# Patient Record
Sex: Female | Born: 1960 | Race: White | Hispanic: No | Smoking: Former smoker
Health system: Southern US, Community
[De-identification: ages and names within clinical notes are randomized; demographics above are authoritative.]

## PROBLEM LIST (undated history)

## (undated) DIAGNOSIS — M069 Rheumatoid arthritis, unspecified: Secondary | ICD-10-CM

## (undated) DIAGNOSIS — I2089 Other forms of angina pectoris: Secondary | ICD-10-CM

## (undated) DIAGNOSIS — J45909 Unspecified asthma, uncomplicated: Secondary | ICD-10-CM

## (undated) DIAGNOSIS — M5431 Sciatica, right side: Secondary | ICD-10-CM

## (undated) DIAGNOSIS — Q675 Congenital deformity of spine: Secondary | ICD-10-CM

## (undated) DIAGNOSIS — K219 Gastro-esophageal reflux disease without esophagitis: Secondary | ICD-10-CM

## (undated) DIAGNOSIS — M109 Gout, unspecified: Secondary | ICD-10-CM

## (undated) DIAGNOSIS — M51369 Other intervertebral disc degeneration, lumbar region without mention of lumbar back pain or lower extremity pain: Secondary | ICD-10-CM

## (undated) DIAGNOSIS — E119 Type 2 diabetes mellitus without complications: Secondary | ICD-10-CM

## (undated) DIAGNOSIS — Z8739 Personal history of other diseases of the musculoskeletal system and connective tissue: Secondary | ICD-10-CM

## (undated) DIAGNOSIS — E785 Hyperlipidemia, unspecified: Secondary | ICD-10-CM

## (undated) DIAGNOSIS — M5416 Radiculopathy, lumbar region: Secondary | ICD-10-CM

## (undated) DIAGNOSIS — I208 Other forms of angina pectoris: Secondary | ICD-10-CM

## (undated) DIAGNOSIS — I10 Essential (primary) hypertension: Secondary | ICD-10-CM

## (undated) DIAGNOSIS — F329 Major depressive disorder, single episode, unspecified: Secondary | ICD-10-CM

## (undated) DIAGNOSIS — M5136 Other intervertebral disc degeneration, lumbar region: Secondary | ICD-10-CM

## (undated) DIAGNOSIS — F32A Depression, unspecified: Secondary | ICD-10-CM

## (undated) DIAGNOSIS — F319 Bipolar disorder, unspecified: Secondary | ICD-10-CM

## (undated) DIAGNOSIS — Z87442 Personal history of urinary calculi: Secondary | ICD-10-CM

## (undated) DIAGNOSIS — J449 Chronic obstructive pulmonary disease, unspecified: Secondary | ICD-10-CM

## (undated) DIAGNOSIS — Z8639 Personal history of other endocrine, nutritional and metabolic disease: Secondary | ICD-10-CM

## (undated) DIAGNOSIS — F410 Panic disorder [episodic paroxysmal anxiety] without agoraphobia: Secondary | ICD-10-CM

## (undated) DIAGNOSIS — F419 Anxiety disorder, unspecified: Secondary | ICD-10-CM

## (undated) DIAGNOSIS — M1990 Unspecified osteoarthritis, unspecified site: Secondary | ICD-10-CM

## (undated) DIAGNOSIS — G43909 Migraine, unspecified, not intractable, without status migrainosus: Secondary | ICD-10-CM

## (undated) HISTORY — DX: Migraine, unspecified, not intractable, without status migrainosus: G43.909

## (undated) HISTORY — PX: BACK SURGERY: SHX140

## (undated) HISTORY — DX: Essential (primary) hypertension: I10

## (undated) HISTORY — DX: Congenital deformity of spine: Q67.5

## (undated) HISTORY — DX: Personal history of urinary calculi: Z87.442

## (undated) HISTORY — PX: POLYPECTOMY: SHX149

## (undated) HISTORY — PX: FOOT SURGERY: SHX648

## (undated) HISTORY — DX: Personal history of other endocrine, nutritional and metabolic disease: Z86.39

## (undated) HISTORY — DX: Personal history of other diseases of the musculoskeletal system and connective tissue: Z87.39

## (undated) HISTORY — DX: Chronic obstructive pulmonary disease, unspecified: J44.9

## (undated) HISTORY — DX: Sciatica, right side: M54.31

## (undated) HISTORY — DX: Radiculopathy, lumbar region: M54.16

## (undated) HISTORY — DX: Type 2 diabetes mellitus without complications: E11.9

## (undated) HISTORY — DX: Unspecified asthma, uncomplicated: J45.909

## (undated) HISTORY — DX: Panic disorder (episodic paroxysmal anxiety): F41.0

## (undated) HISTORY — DX: Rheumatoid arthritis, unspecified: M06.9

## (undated) HISTORY — DX: Depression, unspecified: F32.A

## (undated) HISTORY — DX: Other forms of angina pectoris: I20.8

## (undated) HISTORY — DX: Other forms of angina pectoris: I20.89

## (undated) HISTORY — DX: Hyperlipidemia, unspecified: E78.5

## (undated) HISTORY — DX: Other intervertebral disc degeneration, lumbar region without mention of lumbar back pain or lower extremity pain: M51.369

## (undated) HISTORY — PX: CARPAL TUNNEL RELEASE: SHX101

## (undated) HISTORY — DX: Major depressive disorder, single episode, unspecified: F32.9

## (undated) HISTORY — DX: Gout, unspecified: M10.9

## (undated) HISTORY — DX: Unspecified osteoarthritis, unspecified site: M19.90

## (undated) HISTORY — DX: Bipolar disorder, unspecified: F31.9

## (undated) HISTORY — DX: Other intervertebral disc degeneration, lumbar region: M51.36

## (undated) HISTORY — DX: Gastro-esophageal reflux disease without esophagitis: K21.9

---

## 2001-01-16 HISTORY — PX: ABDOMINAL HYSTERECTOMY: SHX81

## 2006-06-07 ENCOUNTER — Ambulatory Visit: Payer: Self-pay | Admitting: Internal Medicine

## 2006-06-08 ENCOUNTER — Ambulatory Visit: Payer: Self-pay | Admitting: Internal Medicine

## 2007-08-07 ENCOUNTER — Ambulatory Visit: Payer: Self-pay | Admitting: Family Medicine

## 2007-09-24 ENCOUNTER — Ambulatory Visit: Payer: Self-pay | Admitting: Gastroenterology

## 2007-12-31 ENCOUNTER — Ambulatory Visit: Payer: Self-pay | Admitting: Family Medicine

## 2009-09-28 ENCOUNTER — Ambulatory Visit: Payer: Self-pay | Admitting: Family Medicine

## 2010-03-02 ENCOUNTER — Ambulatory Visit: Payer: Self-pay | Admitting: Family Medicine

## 2010-07-18 ENCOUNTER — Ambulatory Visit: Payer: Self-pay

## 2010-12-05 ENCOUNTER — Inpatient Hospital Stay: Payer: Self-pay | Admitting: Psychiatry

## 2011-06-16 ENCOUNTER — Ambulatory Visit: Payer: Self-pay | Admitting: Specialist

## 2011-09-27 ENCOUNTER — Ambulatory Visit: Payer: Self-pay | Admitting: Family Medicine

## 2011-10-17 ENCOUNTER — Ambulatory Visit: Payer: Self-pay | Admitting: Family Medicine

## 2011-11-17 ENCOUNTER — Ambulatory Visit: Payer: Self-pay | Admitting: Family Medicine

## 2012-03-14 ENCOUNTER — Ambulatory Visit: Payer: Self-pay

## 2013-04-11 ENCOUNTER — Ambulatory Visit: Payer: Self-pay | Admitting: Adult Health

## 2014-03-25 ENCOUNTER — Institutional Professional Consult (permissible substitution): Payer: Self-pay | Admitting: Internal Medicine

## 2014-04-22 ENCOUNTER — Encounter: Payer: Self-pay | Admitting: Internal Medicine

## 2014-04-30 ENCOUNTER — Institutional Professional Consult (permissible substitution): Payer: Self-pay | Admitting: Internal Medicine

## 2014-04-30 ENCOUNTER — Encounter: Payer: Self-pay | Admitting: *Deleted

## 2014-07-06 ENCOUNTER — Other Ambulatory Visit: Payer: Self-pay | Admitting: Family Medicine

## 2014-07-06 MED ORDER — LEVOTHYROXINE SODIUM 50 MCG PO TABS: 50.0000 ug | ORAL_TABLET | Freq: Every day | ORAL | Status: DC

## 2014-07-06 NOTE — Telephone Encounter (Signed)
I returned the call; she has not been here since one visit to establish care in February, she was supposed to return one month later, but never came back; I have no labs on her 50 mcg okay for one week (#7 verbal authorized) and then we'll ramp her back up to 75 mcg once I see her (cardiac hx, so I don't want to hit her all at once with 75 if she's been off) He agreed STAFF please call patient at [phone] or [phone] and let her know we need to see her here in the office for an appointment if we're going to be prescribing her medicines and taking care of her Have her come fasting for labs

## 2014-07-06 NOTE — Telephone Encounter (Signed)
Pharmacy called says pt has been out of thyroid medication about 2 months. Jody(pharmacist) says he sent in refill a month ago and would like a call back at [phone]

## 2014-07-13 ENCOUNTER — Other Ambulatory Visit: Payer: Self-pay | Admitting: Family Medicine

## 2014-07-13 NOTE — Telephone Encounter (Signed)
Patient notified, appointment scheduled

## 2014-07-13 NOTE — Telephone Encounter (Signed)
Pt called would like Dr. Sanda Klein to call her about the dosage of her thyroid medication believes it is supposed to be 75mg  not 50mg . Please call pt @ [phone]. Pharm is [facility]. Thanks.

## 2014-07-13 NOTE — Telephone Encounter (Signed)
She did schedule an appt with you, she needs a rx for the 78mcg of Synthroid now.

## 2014-07-14 MED ORDER — LEVOTHYROXINE SODIUM 75 MCG PO TABS: 75.0000 ug | ORAL_TABLET | Freq: Every day | ORAL | Status: DC

## 2014-07-14 NOTE — Telephone Encounter (Signed)
Patient did take the 74mcg, I explained the reasoning for it yesterday. I told her it was very important that she keep her appointment.

## 2014-07-14 NOTE — Telephone Encounter (Signed)
Please let patient know that I spoke with pharmacist about this earlier when I sent the 50 mcg; she had not been taking it regularly, and with her heart history, I was not going to jumpstart her from zero micrograms to 75 mcg all at once She was supposed to take the 50 mcg just short-term and now go back on 75 mcg It's very important that she keep her appt with me very soon; I saw her in February and she was supposed to f/u with me in March so she's well overdue for her visit and labs We need to see her regularly and monitor her labs if we're going to be able to prescribe medicine for her

## 2014-07-17 ENCOUNTER — Telehealth: Payer: Self-pay | Admitting: Family Medicine

## 2014-07-20 NOTE — Telephone Encounter (Signed)
noted 

## 2014-07-28 DIAGNOSIS — I10 Essential (primary) hypertension: Secondary | ICD-10-CM | POA: Insufficient documentation

## 2014-07-28 DIAGNOSIS — J449 Chronic obstructive pulmonary disease, unspecified: Secondary | ICD-10-CM | POA: Insufficient documentation

## 2014-07-28 DIAGNOSIS — E1140 Type 2 diabetes mellitus with diabetic neuropathy, unspecified: Secondary | ICD-10-CM | POA: Insufficient documentation

## 2014-07-30 ENCOUNTER — Ambulatory Visit: Payer: Self-pay | Admitting: Family Medicine

## 2014-08-07 ENCOUNTER — Encounter: Payer: Self-pay | Admitting: Family Medicine

## 2014-08-07 ENCOUNTER — Other Ambulatory Visit: Payer: Self-pay | Admitting: Psychiatry

## 2014-08-07 ENCOUNTER — Ambulatory Visit (INDEPENDENT_AMBULATORY_CARE_PROVIDER_SITE_OTHER): Payer: Medicare Other | Admitting: Family Medicine

## 2014-08-07 VITALS — BP 121/79 | HR 113 | Temp 99.4°F | Ht 60.5 in | Wt 154.0 lb

## 2014-08-07 DIAGNOSIS — Q675 Congenital deformity of spine: Secondary | ICD-10-CM | POA: Diagnosis not present

## 2014-08-07 DIAGNOSIS — E039 Hypothyroidism, unspecified: Secondary | ICD-10-CM | POA: Insufficient documentation

## 2014-08-07 DIAGNOSIS — M1990 Unspecified osteoarthritis, unspecified site: Secondary | ICD-10-CM

## 2014-08-07 DIAGNOSIS — E1140 Type 2 diabetes mellitus with diabetic neuropathy, unspecified: Secondary | ICD-10-CM | POA: Diagnosis not present

## 2014-08-07 DIAGNOSIS — J449 Chronic obstructive pulmonary disease, unspecified: Secondary | ICD-10-CM | POA: Diagnosis not present

## 2014-08-07 DIAGNOSIS — M109 Gout, unspecified: Secondary | ICD-10-CM | POA: Diagnosis not present

## 2014-08-07 DIAGNOSIS — Z5181 Encounter for therapeutic drug level monitoring: Secondary | ICD-10-CM | POA: Insufficient documentation

## 2014-08-07 DIAGNOSIS — R350 Frequency of micturition: Secondary | ICD-10-CM | POA: Insufficient documentation

## 2014-08-07 DIAGNOSIS — E785 Hyperlipidemia, unspecified: Secondary | ICD-10-CM | POA: Insufficient documentation

## 2014-08-07 NOTE — Assessment & Plan Note (Signed)
Not sure if rheumatoid; get outside records relating to this issue

## 2014-08-07 NOTE — Progress Notes (Signed)
BP 121/79 mmHg  Pulse 113  Temp(Src) 99.4 F (37.4 C)  Ht 5' 0.5" (1.537 m)  Wt 154 lb (69.854 kg)  BMI 29.57 kg/m2  SpO2 97%   Subjective:    Patient ID: Deborah Blackwell, female    DOB: [birth year], 54 y.o.   MRN: [id number]  HPI: Deborah Blackwell is a 54 y.o. female  Chief Complaint  Patient presents with  . medication refills    now has medicare   Patient is here for follow-up; long overdue for a visit Her diabetes has been doing real well except her feet and legs; decreased sensation; feet stay cold all the times; gets shooting pains in her feet and hands; not sure if arthritis I redirected her back to diabetes; she checks her sugars every other day; she is getting 120 she says She does get dry mouth, always drinking something Cannot tolerate metformin, made her sick to her stomach Last eye exam was... "I need one"; she doesn't have vision insurance, but thinks she will go   High cholesterol; on statin; two eggs a week; no side effects  Hypertension; checks BP away from doctor she says; she says it usually goes up at the doctor; 127 or 130 on top; 95 or 100 on the bottom  Abnormal thyroid test discovered several years ago; she did not have surgery or radiation, it just turned up abnormal on the blood test  She sees a new psychiatrist; she is going to be late on that medication, Dr. Jimmye Norman; she has been getting pain medicine and anxiety medicine from Dr. Mamie Nick but says she is going to run out before she sees the new doctor and asked if I can do that medicine  She says she has rheumatoid arthritis, but never saw the rheumatologist; she says they never diagnosed her with it, she was going to see if that's what she had; she asked Dr. Rutherford Nail what he thought and he told her that she needed to see a rheumatologist  She does not have her pulmonary doctor right now; smoked for a year; COPD after just smoking for a year  Past Medical History  Diagnosis Date  . History of scoliosis    was casted as a child  . Angina at rest   . Arthritis   . Asthma   . GERD (gastroesophageal reflux disease)   . COPD (chronic obstructive pulmonary disease)   . Diabetes mellitus without complication   . Hypertension   . Hyperlipidemia   . Gout   . RA (rheumatoid arthritis)   . History of kidney stones   . Migraines   . Depression   . Panic attack   . Bipolar disorder   . Congenital scoliosis   . DDD (degenerative disc disease), lumbar    Relevant past medical, surgical, family and social history reviewed and updated as indicated. Interim medical history since our last visit reviewed. Allergies and medications reviewed and updated.  Review of Systems  Per HPI unless specifically indicated above     Objective:    BP 121/79 mmHg  Pulse 113  Temp(Src) 99.4 F (37.4 C)  Ht 5' 0.5" (1.537 m)  Wt 154 lb (69.854 kg)  BMI 29.57 kg/m2  SpO2 97%  Wt Readings from Last 3 Encounters:  08/07/14 154 lb (69.854 kg)  03/11/14 153 lb (69.4 kg)    Physical Exam  Constitutional: She appears well-developed and well-nourished.  HENT:  Head: Normocephalic and atraumatic.  Cardiovascular: Regular rhythm.  Tachycardia  present.   Pulses:      Dorsalis pedis pulses are 1+ on the right side, and 1+ on the left side.  Musculoskeletal: She exhibits no edema.       Lumbar back: She exhibits no swelling and no edema.  I did not appreciate any significant deformity  Neurological: She is alert.  Antalgic, hobbling gait putting one foot out to the side when she walks; no significant callus formation or foot deformity to reflect chronic unsteady gait  Skin: Skin is warm and dry.  Psychiatric: She has a normal mood and affect. Her behavior is normal. Judgment and thought content normal.   Diabetic Foot Form - Detailed   Diabetic Foot Exam - detailed  Diabetic Foot exam was performed with the following findings:  Yes 08/07/2014  2:11 PM  Visual Foot Exam completed.:  Yes  Is there a history of  foot ulcer?:  No  Can the patient see the bottom of their feet?:  Yes  Are the shoes appropriate in style and fit?:  Yes  Are the toenails long?:  No  Are the toenails thick?:  No  Are the toenails ingrown?:  No    Pulse Foot Exam completed.:  Yes  Right Dorsalis Pedis:  Diminished Left Dorsalis Pedis:  Diminished  Sensory Foot Exam Completed.:  Yes  Swelling:  No  Semmes-Weinstein Monofilament Test  R Site 1-Great Toe:  Neg L Site 1-Great Toe:  Neg  R Site 4:  Neg L Site 4:  Neg         No results found for this or any previous visit.    Assessment & Plan:   Problem List Items Addressed This Visit      Respiratory   COPD (chronic obstructive pulmonary disease)    Managed by pulmonologist, using inhaler and nebulizer      Relevant Medications   budesonide-formoterol (SYMBICORT) 160-4.5 MCG/ACT inhaler     Endocrine   Diabetes mellitus with diabetic neuropathy - Primary    Check A1C today, it was 5.8 showing excellent control Check urine microalbumin; negative Continue the onglyza Refer for eye exam      Relevant Orders   Bayer DCA Hb A1c Waived   Microalbumin, Urine Waived   Hypothyroidism   Relevant Orders   TSH     Musculoskeletal and Integument   Arthritis    Not sure if rheumatoid; get outside records relating to this issue      Congenital scoliosis    Managed by another provider; hx of epidurals; she pressed about the pain medicine and whether or not I would prescribe this; I reiterated to her that I am not going to prescribe oxycodone and that she needs to contact the prescriber currently giving her this medicine to work out getting another Rx or tapering or referral to pain clinic; I am happy to talk to the prescriber personally, but otherwise will not plan to get involved        Other   Gout    Check uric acid today Avoid foods rich in purines (organ meats, chicken soup, [place], gravies)      Hyperlipidemia    Check lipids; on statin; limit  saturated fats; LDL today is 133 on 40 mg of Lipitor; wait to get the liver function tests back and if okay, we'll increase to 80 mg      Relevant Orders   Lipid Panel Piccolo, Waived   Medication monitoring encounter   Relevant Orders   UA/M  w/rflx Culture, Routine   CBC with Differential/Platelet   Comprehensive metabolic panel    Other Visit Diagnoses    Urinary frequency        check urinalysis today; 1+ LE but 0-5 WBCs/hpf; culture pending        Follow up plan: Return in about 1 month (around 09/07/2014) for multiple health issues.

## 2014-08-07 NOTE — Assessment & Plan Note (Signed)
Check uric acid today Avoid foods rich in purines (organ meats, chicken soup, [place], gravies)

## 2014-08-07 NOTE — Assessment & Plan Note (Addendum)
Managed by another provider; hx of epidurals; she pressed about the pain medicine and whether or not I would prescribe this; I reiterated to her that I am not going to prescribe oxycodone and that she needs to contact the prescriber currently giving her this medicine to work out getting another Rx or tapering or referral to pain clinic; I am happy to talk to the prescriber personally, but otherwise will not plan to get involved

## 2014-08-07 NOTE — Assessment & Plan Note (Addendum)
Check A1C today, it was 5.8 showing excellent control Check urine microalbumin; negative Continue the onglyza Refer for eye exam

## 2014-08-07 NOTE — Assessment & Plan Note (Signed)
Managed by pulmonologist, using inhaler and nebulizer

## 2014-08-07 NOTE — Patient Instructions (Addendum)
Check your sugars once a day on average, but it's okay to check less frequently if you desire Try to limit sweets in your diet (sugary drinks like soft drinks, sweet iced tea, cookies, candies, etc.) as well as simple carbohydrated (white bread, white potatoes, white rice) You will need to call "Dr. Mamie Nick" for whatever medicine it is that she prescribes for you I will not be managing her medicines (the oxycodone or alprazolam) If you stop alprazolam abruptly, that can cause seizures so you need to work with Dr. Mamie Nick about a plan to continue that or wean that safely You may suffer withdraw from the narcotics if you stop that abruptly, so talk with Dr. Mamie Nick about that or see a pain specialist Please ask Dr. Mamie Nick to arrange for any referrals or call me personally We'll schedule the eye doctor appointment for you Check the bottom of your every single night Limit saturated fats in your diet (cheese, fried foods, hamburgers, sausage, bacon) If your cuff at home is reading 95 or 100 on the bottom when you check your pressures, we'll want you to bring your cuff in and measure your pressure here against our cuff Limit salt in the diet Return in one month for further issues I don't recommend that you use a triptan   DASH Eating Plan DASH stands for "Dietary Approaches to Stop Hypertension." The DASH eating plan is a healthy eating plan that has been shown to reduce high blood pressure (hypertension). Additional health benefits may include reducing the risk of type 2 diabetes mellitus, heart disease, and stroke. The DASH eating plan may also help with weight loss. WHAT DO I NEED TO KNOW ABOUT THE DASH EATING PLAN? For the DASH eating plan, you will follow these general guidelines:  Choose foods with a percent daily value for sodium of less than 5% (as listed on the food label).  Use salt-free seasonings or herbs instead of table salt or sea salt.  Check with your health care provider or pharmacist before using salt  substitutes.  Eat lower-sodium products, often labeled as "lower sodium" or "no salt added."  Eat fresh foods.  Eat more vegetables, fruits, and low-fat dairy products.  Choose whole grains. Look for the word "whole" as the first word in the ingredient list.  Choose fish and skinless chicken or [place] more often than red meat. Limit fish, poultry, and meat to 6 oz (170 g) each day.  Limit sweets, desserts, sugars, and sugary drinks.  Choose heart-healthy fats.  Limit cheese to 1 oz (28 g) per day.  Eat more home-cooked food and less restaurant, buffet, and fast food.  Limit fried foods.  Cook foods using methods other than frying.  Limit canned vegetables. If you do use them, rinse them well to decrease the sodium.  When eating at a restaurant, ask that your food be prepared with less salt, or no salt if possible. WHAT FOODS CAN I EAT? Seek help from a dietitian for individual calorie needs. Grains Whole grain or whole wheat bread. Brown rice. Whole grain or whole wheat pasta. Quinoa, bulgur, and whole grain cereals. Low-sodium cereals. Corn or whole wheat flour tortillas. Whole grain cornbread. Whole grain crackers. Low-sodium crackers. Vegetables Fresh or frozen vegetables (raw, steamed, roasted, or grilled). Low-sodium or reduced-sodium tomato and vegetable juices. Low-sodium or reduced-sodium tomato sauce and paste. Low-sodium or reduced-sodium canned vegetables.  Fruits All fresh, canned (in natural juice), or frozen fruits. Meat and Other Protein Products Ground beef (85% or leaner),  grass-fed beef, or beef trimmed of fat. Skinless chicken or [place]. Ground chicken or [place]. Pork trimmed of fat. All fish and seafood. Eggs. Dried beans, peas, or lentils. Unsalted nuts and seeds. Unsalted canned beans. Dairy Low-fat dairy products, such as skim or 1% milk, 2% or reduced-fat cheeses, low-fat ricotta or cottage cheese, or plain low-fat yogurt. Low-sodium or reduced-sodium  cheeses. Fats and Oils Tub margarines without trans fats. Light or reduced-fat mayonnaise and salad dressings (reduced sodium). Avocado. Safflower, olive, or canola oils. Natural peanut or almond butter. Other Unsalted popcorn and pretzels. The items listed above may not be a complete list of recommended foods or beverages. Contact your dietitian for more options. WHAT FOODS ARE NOT RECOMMENDED? Grains White bread. White pasta. White rice. Refined cornbread. Bagels and croissants. Crackers that contain trans fat. Vegetables Creamed or fried vegetables. Vegetables in a cheese sauce. Regular canned vegetables. Regular canned tomato sauce and paste. Regular tomato and vegetable juices. Fruits Dried fruits. Canned fruit in light or heavy syrup. Fruit juice. Meat and Other Protein Products Fatty cuts of meat. Ribs, chicken wings, bacon, sausage, bologna, salami, chitterlings, fatback, hot dogs, bratwurst, and packaged luncheon meats. Salted nuts and seeds. Canned beans with salt. Dairy Whole or 2% milk, cream, half-and-half, and cream cheese. Whole-fat or sweetened yogurt. Full-fat cheeses or blue cheese. Nondairy creamers and whipped toppings. Processed cheese, cheese spreads, or cheese curds. Condiments Onion and garlic salt, seasoned salt, table salt, and sea salt. Canned and packaged gravies. Worcestershire sauce. Tartar sauce. Barbecue sauce. Teriyaki sauce. Soy sauce, including reduced sodium. Steak sauce. Fish sauce. Oyster sauce. Cocktail sauce. Horseradish. Ketchup and mustard. Meat flavorings and tenderizers. Bouillon cubes. Hot sauce. Tabasco sauce. Marinades. Taco seasonings. Relishes. Fats and Oils Butter, stick margarine, lard, shortening, ghee, and bacon fat. Coconut, palm kernel, or palm oils. Regular salad dressings. Other Pickles and olives. Salted popcorn and pretzels. The items listed above may not be a complete list of foods and beverages to avoid. Contact your dietitian for  more information. WHERE CAN I FIND MORE INFORMATION? National Heart, Lung, and Blood Institute: travelstabloid.com Document Released: 12/22/2010 Document Revised: 05/19/2013 Document Reviewed: 11/06/2012 [facility] Patient Information 2015 [place], [place]. This information is not intended to replace advice given to you by your health care provider. Make sure you discuss any questions you have with your health care provider. Diabetes and Foot Care Diabetes may cause you to have problems because of poor blood supply (circulation) to your feet and legs. This may cause the skin on your feet to become thinner, break easier, and heal more slowly. Your skin may become dry, and the skin may peel and crack. You may also have nerve damage in your legs and feet causing decreased feeling in them. You may not notice minor injuries to your feet that could lead to infections or more serious problems. Taking care of your feet is one of the most important things you can do for yourself.  HOME CARE INSTRUCTIONS  Wear shoes at all times, even in the house. Do not go barefoot. Bare feet are easily injured.  Check your feet daily for blisters, cuts, and redness. If you cannot see the bottom of your feet, use a mirror or ask someone for help.  Wash your feet with warm water (do not use hot water) and mild soap. Then pat your feet and the areas between your toes until they are completely dry. Do not soak your feet as this can dry your skin.  Apply a  moisturizing lotion or petroleum jelly (that does not contain alcohol and is unscented) to the skin on your feet and to dry, brittle toenails. Do not apply lotion between your toes.  Trim your toenails straight across. Do not dig under them or around the cuticle. File the edges of your nails with an emery board or nail file.  Do not cut corns or calluses or try to remove them with medicine.  Wear clean socks or stockings every day. Make sure  they are not too tight. Do not wear knee-high stockings since they may decrease blood flow to your legs.  Wear shoes that fit properly and have enough cushioning. To break in new shoes, wear them for just a few hours a day. This prevents you from injuring your feet. Always look in your shoes before you put them on to be sure there are no objects inside.  Do not cross your legs. This may decrease the blood flow to your feet.  If you find a minor scrape, cut, or break in the skin on your feet, keep it and the skin around it clean and dry. These areas may be cleansed with mild soap and water. Do not cleanse the area with peroxide, alcohol, or iodine.  When you remove an adhesive bandage, be sure not to damage the skin around it.  If you have a wound, look at it several times a day to make sure it is healing.  Do not use heating pads or hot water bottles. They may burn your skin. If you have lost feeling in your feet or legs, you may not know it is happening until it is too late.  Make sure your health care provider performs a complete foot exam at least annually or more often if you have foot problems. Report any cuts, sores, or bruises to your health care provider immediately. SEEK MEDICAL CARE IF:   You have an injury that is not healing.  You have cuts or breaks in the skin.  You have an ingrown nail.  You notice redness on your legs or feet.  You feel burning or tingling in your legs or feet.  You have pain or cramps in your legs and feet.  Your legs or feet are numb.  Your feet always feel cold. SEEK IMMEDIATE MEDICAL CARE IF:   There is increasing redness, swelling, or pain in or around a wound.  There is a red line that goes up your leg.  Pus is coming from a wound.  You develop a fever or as directed by your health care provider.  You notice a bad smell coming from an ulcer or wound. Document Released: 12/31/1999 Document Revised: 09/04/2012 Document Reviewed:  06/11/2012 [facility] Patient Information 2015 [place], [place]. This information is not intended to replace advice given to you by your health care provider. Make sure you discuss any questions you have with your health care provider. Dyslipidemia Dyslipidemia is an imbalance of the lipids in your blood. Lipids are waxy, fat-like proteins that your body needs in small amounts. Dyslipidemia often involves the lipids cholesterol or triglycerides. Common forms of dyslipidemia are:  High levels of bad cholesterol (LDL cholesterol). LDL cholesterol is the type of cholesterol that causes heart disease.  Low levels of good cholesterol (HDL cholesterol). HDL cholesterol is the type of cholesterol that helps protect against heart disease.  High levels of triglycerides. Triglycerides are a fatty substance in the blood linked to a buildup of plaque on your arteries. RISK FACTORS  Increased age.  Having a family history of high cholesterol.  Certain medicines, including birth control pills, diuretics, beta-blockers, and some medicines for depression.  Smoking.  Eating a high-fat diet.  Being overweight.  Medical conditions such as diabetes, polycystic ovary syndrome, pregnancy, kidney disease, and hypothyroidism.  Lack of regular exercise. SIGNS AND SYMPTOMS There are no signs or symptoms with dyslipidemia.  DIAGNOSIS  A simple blood test called a fasting blood test can be done to determine your level of:  Total cholesterol. This is the combined number of LDL cholesterol and HDL cholesterol. A healthy number is lower than 200.  LDL cholesterol. The goal number for LDL cholesterol is different for each person depending on risk factors. Ask your health care provider what your LDL cholesterol number should be.  HDL cholesterol. A healthy level of HDL cholesterol is 60 or higher. A number lower than 40 for men or 50 for women is a danger sign.  Triglycerides. A healthy triglyceride number is less  than 150. TREATMENT  Dyslipidemia is a treatable condition. Your health care provider will advise you on what type of treatment is best based on your age, your test results, and current guidelines. Treatment may include:   Dietary changes. A dietitian can help you create a meal plan. You may need to:  Eat more foods that contain omega-3s, such as salmon and other fish.  Replace saturated fats and trans fats in your diet with healthy fats such as nuts, seeds, avocados, olive oil, and canola oil.  Regular exercise. This can help lower your LDL cholesterol, raise your HDL cholesterol, and help with weight management. Check with your health care provider before beginning an exercise program. Most people should participate in 30 minutes of brisk exercise 5 days a week.  Quitting smoking.  Medicines to lower LDL cholesterol and triglycerides. Your health care provider will monitor your lipid levels with regular blood tests. HOME CARE INSTRUCTIONS  Eat a healthy diet. Follow any diet instructions if they were given to you by your health care provider.  Maintain a healthy weight.  Exercise regularly based on the recommendations of your health care provider.  Do not use any tobacco products, including cigarettes, chewing tobacco, or electronic cigarettes.  Take medicines only as directed by your health care provider.  Keep all follow-up visits as directed by your health care provider. SEEK MEDICAL CARE IF: You are having possible side effects from your medicines. Document Released: 01/07/2013 Document Revised: 05/19/2013 Document Reviewed: 01/07/2013 [facility] Patient Information 2015 [place], [place]. This information is not intended to replace advice given to you by your health care provider. Make sure you discuss any questions you have with your health care provider.

## 2014-08-07 NOTE — Assessment & Plan Note (Addendum)
Check lipids; on statin; limit saturated fats; LDL today is 133 on 40 mg of Lipitor; wait to get the liver function tests back and if okay, we'll increase to 80 mg

## 2014-08-08 LAB — LIPID PANEL PICCOLO, WAIVED
CHOL/HDL RATIO PICCOLO,WAIVE: 4.5 mg/dL
Cholesterol Piccolo, Waived: 196 mg/dL (ref <200)
HDL Chol Piccolo, Waived: 43 mg/dL — ABNORMAL LOW (ref >59)
LDL Chol Calc Piccolo Waived: 133 mg/dL — ABNORMAL HIGH (ref <100)
Triglycerides Piccolo,Waived: 100 mg/dL (ref <150)
VLDL CHOL CALC PICCOLO,WAIVE: 20 mg/dL (ref <30)

## 2014-08-08 LAB — MICROSCOPIC EXAMINATION

## 2014-08-08 LAB — COMPREHENSIVE METABOLIC PANEL
ALBUMIN: 4.4 g/dL (ref 3.5–5.5)
ALT: 17 IU/L (ref 0–32)
AST: 19 IU/L (ref 0–40)
Albumin/Globulin Ratio: 1.4 (ref 1.1–2.5)
Alkaline Phosphatase: 84 IU/L (ref 39–117)
BILIRUBIN TOTAL: 0.5 mg/dL (ref 0.0–1.2)
BUN / CREAT RATIO: 7 — AB (ref 9–23)
BUN: 6 mg/dL (ref 6–24)
CHLORIDE: 102 mmol/L (ref 97–108)
CO2: 23 mmol/L (ref 18–29)
CREATININE: 0.84 mg/dL (ref 0.57–1.00)
Calcium: 9.8 mg/dL (ref 8.7–10.2)
GFR, EST AFRICAN AMERICAN: 90 mL/min/{1.73_m2} (ref >59)
GFR, EST NON AFRICAN AMERICAN: 78 mL/min/{1.73_m2} (ref >59)
Globulin, Total: 3.1 g/dL (ref 1.5–4.5)
Glucose: 149 mg/dL — ABNORMAL HIGH (ref 65–99)
Potassium: 4.8 mmol/L (ref 3.5–5.2)
SODIUM: 143 mmol/L (ref 134–144)
Total Protein: 7.5 g/dL (ref 6.0–8.5)

## 2014-08-08 LAB — CBC WITH DIFFERENTIAL/PLATELET
Basophils Absolute: 0 10*3/uL (ref 0.0–0.2)
Basos: 0 %
EOS (ABSOLUTE): 0 10*3/uL (ref 0.0–0.4)
Eos: 1 %
Hematocrit: 43.2 % (ref 34.0–46.6)
Hemoglobin: 14.5 g/dL (ref 11.1–15.9)
IMMATURE GRANULOCYTES: 0 %
Immature Grans (Abs): 0 10*3/uL (ref 0.0–0.1)
Lymphocytes Absolute: 1.7 10*3/uL (ref 0.7–3.1)
Lymphs: 21 %
MCH: 29 pg (ref 26.6–33.0)
MCHC: 33.6 g/dL (ref 31.5–35.7)
MCV: 86 fL (ref 79–97)
MONOS ABS: 0.5 10*3/uL (ref 0.1–0.9)
Monocytes: 6 %
NEUTROS PCT: 72 %
Neutrophils Absolute: 5.7 10*3/uL (ref 1.4–7.0)
PLATELETS: 315 10*3/uL (ref 150–379)
RBC: 5 x10E6/uL (ref 3.77–5.28)
RDW: 13.5 % (ref 12.3–15.4)
WBC: 7.9 10*3/uL (ref 3.4–10.8)

## 2014-08-08 LAB — MICROALBUMIN, URINE WAIVED
Creatinine, Urine Waived: 50 mg/dL (ref 10–300)
Microalb, Ur Waived: 10 mg/L (ref 0–19)
Microalb/Creat Ratio: <30 mg/g (ref <30)

## 2014-08-08 LAB — BAYER DCA HB A1C WAIVED: HB A1C (BAYER DCA - WAIVED): 5.8 % (ref <7.0)

## 2014-08-08 LAB — TSH: TSH: 1.7 u[IU]/mL (ref 0.450–4.500)

## 2014-08-09 ENCOUNTER — Encounter: Payer: Self-pay | Admitting: Family Medicine

## 2014-08-09 LAB — UA/M W/RFLX CULTURE, ROUTINE: Organism ID, Bacteria: NO GROWTH

## 2014-08-11 ENCOUNTER — Telehealth: Payer: Self-pay

## 2014-08-11 ENCOUNTER — Other Ambulatory Visit: Payer: Self-pay

## 2014-08-11 DIAGNOSIS — Z79891 Long term (current) use of opiate analgesic: Secondary | ICD-10-CM | POA: Insufficient documentation

## 2014-08-11 NOTE — Telephone Encounter (Signed)
Patient notified

## 2014-08-11 NOTE — Telephone Encounter (Signed)
Dr. Bary Leriche returned a call in regards to her pain meds. Approx. 3 months ago (maybe March), Deborah Blackwell felt that her Medicare was going to start at the beginning of the year and then she found out that it would not start until July. At that time, she could not afford to go to pain management, and Dr. Mamie Nick stated she had known her for a long time and she did not want to have to admit Deborah Blackwell for detox from withdrawls, so she went ahead and wrote her rxs for her pain med to cover her until she could get into pain med in July. Now Deborah Blackwell keeps calling her begging her for more refills and she is advising Deborah Blackwell that their arrangement was to cover her until July and then Deborah Blackwell had to go see pain management.  Dr. Guadlupe Spanish is also no longer seeing patients in the clinic, so Deborah Blackwell will be getting switched to another psych doctor in their clinic. She said if you had any other questions, to please feel free to page her.

## 2014-08-11 NOTE — Telephone Encounter (Signed)
I will enter a pain clinic referral; it doesn't say in the note that Dr. Bary Leriche did that (I don't know why she wouldn't have done that if she was the managing provider) If patient cannot get in to see pain clinic soon enough for her satisfaction, she is welcome to go to a methadone clinic to see about weaning off and getting on medicine while she comes off of prescription opiates [facility]  [address], [place] [address] [phone]  It sounds like Dr. Bary Leriche told her that she would only prescribe medicines until July per the note below I will make referral but I will not prescribe any pain medicines and we will not be involved further

## 2014-08-12 ENCOUNTER — Telehealth: Payer: Self-pay

## 2014-08-12 MED ORDER — LEVOTHYROXINE SODIUM 75 MCG PO TABS: 75.0000 ug | ORAL_TABLET | Freq: Every day | ORAL | Status: DC

## 2014-08-12 NOTE — Telephone Encounter (Signed)
Patient called back and left a message stating the Methadone clinic will not accept her due to her being on Alprazolam. She wants to know what else she can do.

## 2014-08-12 NOTE — Telephone Encounter (Signed)
I'm sorry but I'm not writing her any pain medicine I spoke with her, explained she should have worked this out with Dr. Bary Leriche months ago The methadone clinic is not accepting patients on her anxiety medicine she tells me I did enter a pain clinic referral, but do not know when that will be I explained that I will not write any pain medicines

## 2014-08-18 ENCOUNTER — Ambulatory Visit (INDEPENDENT_AMBULATORY_CARE_PROVIDER_SITE_OTHER): Payer: Medicare Other | Admitting: Psychiatry

## 2014-08-18 ENCOUNTER — Encounter: Payer: Self-pay | Admitting: Psychiatry

## 2014-08-18 VITALS — BP 118/78 | HR 60 | Temp 97.7°F | Ht 60.5 in | Wt 157.0 lb

## 2014-08-18 DIAGNOSIS — F332 Major depressive disorder, recurrent severe without psychotic features: Secondary | ICD-10-CM

## 2014-08-18 MED ORDER — ALPRAZOLAM 1 MG PO TABS: 1.0000 mg | ORAL_TABLET | Freq: Four times a day (QID) | ORAL | Status: DC

## 2014-08-18 MED ORDER — ARIPIPRAZOLE 10 MG PO TABS: 10.0000 mg | ORAL_TABLET | Freq: Every day | ORAL | Status: DC

## 2014-08-18 MED ORDER — FLUOXETINE HCL 40 MG PO CAPS: 40.0000 mg | ORAL_CAPSULE | Freq: Every day | ORAL | Status: DC
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
## 2014-08-25 ENCOUNTER — Encounter: Payer: Self-pay | Admitting: Family Medicine

## 2014-09-01 ENCOUNTER — Telehealth: Payer: Self-pay | Admitting: Psychiatry

## 2014-09-01 NOTE — Telephone Encounter (Signed)
spoke with patient told patient that i did fax over the infomation and i have not heard anything back.  I did call and leave a message for dr. crisp office. and I will call them tomorrow to check on the status again.

## 2014-09-01 NOTE — Telephone Encounter (Signed)
left message to check on the statues of the referral for patient.

## 2014-09-02 NOTE — Telephone Encounter (Signed)
left message with referral staff (angela). left message asking about the statues of the appt for patient.

## 2014-09-03 ENCOUNTER — Ambulatory Visit: Payer: Self-pay | Admitting: Psychiatry

## 2014-09-03 NOTE — Telephone Encounter (Signed)
called spoke with Caryl Pina, she states that they called the patient this morining and left message for patient to call them back.

## 2014-09-07 ENCOUNTER — Ambulatory Visit: Payer: Medicare Other | Admitting: Family Medicine

## 2014-09-07 NOTE — Telephone Encounter (Signed)
pt has appt for 09-30-14 @ 11:45

## 2014-09-08 NOTE — Telephone Encounter (Signed)
Referral to pain management has been made per CMA contact with Dr. Ethel Rana office. AW

## 2014-09-16 ENCOUNTER — Ambulatory Visit: Payer: Self-pay | Admitting: Psychiatry

## 2014-09-30 ENCOUNTER — Ambulatory Visit: Payer: Medicare Other | Attending: Anesthesiology | Admitting: Anesthesiology

## 2014-09-30 ENCOUNTER — Telehealth: Payer: Self-pay | Admitting: Psychiatry

## 2014-09-30 ENCOUNTER — Encounter: Payer: Self-pay | Admitting: Anesthesiology

## 2014-09-30 VITALS — BP 102/83 | HR 112 | Temp 98.3°F | Resp 16 | Ht 61.0 in | Wt 154.0 lb

## 2014-09-30 DIAGNOSIS — M545 Low back pain: Secondary | ICD-10-CM | POA: Diagnosis not present

## 2014-09-30 DIAGNOSIS — E039 Hypothyroidism, unspecified: Secondary | ICD-10-CM | POA: Insufficient documentation

## 2014-09-30 DIAGNOSIS — G8929 Other chronic pain: Secondary | ICD-10-CM | POA: Insufficient documentation

## 2014-09-30 DIAGNOSIS — E119 Type 2 diabetes mellitus without complications: Secondary | ICD-10-CM | POA: Diagnosis not present

## 2014-09-30 DIAGNOSIS — M4120 Other idiopathic scoliosis, site unspecified: Secondary | ICD-10-CM

## 2014-09-30 DIAGNOSIS — E1140 Type 2 diabetes mellitus with diabetic neuropathy, unspecified: Secondary | ICD-10-CM | POA: Insufficient documentation

## 2014-09-30 DIAGNOSIS — M5137 Other intervertebral disc degeneration, lumbosacral region: Secondary | ICD-10-CM

## 2014-09-30 DIAGNOSIS — M5416 Radiculopathy, lumbar region: Secondary | ICD-10-CM | POA: Diagnosis not present

## 2014-09-30 DIAGNOSIS — M5431 Sciatica, right side: Secondary | ICD-10-CM | POA: Insufficient documentation

## 2014-09-30 DIAGNOSIS — M4125 Other idiopathic scoliosis, thoracolumbar region: Secondary | ICD-10-CM | POA: Diagnosis not present

## 2014-09-30 DIAGNOSIS — M5010 Cervical disc disorder with radiculopathy, unspecified cervical region: Secondary | ICD-10-CM | POA: Insufficient documentation

## 2014-09-30 DIAGNOSIS — M5136 Other intervertebral disc degeneration, lumbar region: Secondary | ICD-10-CM | POA: Diagnosis not present

## 2014-09-30 DIAGNOSIS — F329 Major depressive disorder, single episode, unspecified: Secondary | ICD-10-CM

## 2014-09-30 DIAGNOSIS — F32A Depression, unspecified: Secondary | ICD-10-CM | POA: Insufficient documentation

## 2014-09-30 DIAGNOSIS — M5441 Lumbago with sciatica, right side: Secondary | ICD-10-CM | POA: Diagnosis not present

## 2014-09-30 DIAGNOSIS — F411 Generalized anxiety disorder: Secondary | ICD-10-CM | POA: Diagnosis not present

## 2014-09-30 DIAGNOSIS — M5412 Radiculopathy, cervical region: Secondary | ICD-10-CM | POA: Diagnosis not present

## 2014-09-30 DIAGNOSIS — M542 Cervicalgia: Secondary | ICD-10-CM | POA: Diagnosis not present

## 2014-09-30 DIAGNOSIS — E1040 Type 1 diabetes mellitus with diabetic neuropathy, unspecified: Secondary | ICD-10-CM

## 2014-09-30 DIAGNOSIS — M5030 Other cervical disc degeneration, unspecified cervical region: Secondary | ICD-10-CM

## 2014-09-30 HISTORY — DX: Sciatica, right side: M54.31

## 2014-09-30 HISTORY — DX: Type 2 diabetes mellitus with diabetic neuropathy, unspecified: E11.40

## 2014-09-30 HISTORY — DX: Radiculopathy, lumbar region: M54.16

## 2014-09-30 NOTE — Progress Notes (Signed)
Subjective:    Patient ID: Deborah Blackwell, female    DOB: [birth year], 54 y.o.   MRN: [id number] This patient is a pleasant delightful 54 year old lady who comes in with 2 major sources of pain. The primary source of pain is chronic low back pain radiating into the right buttock and then going down into the right leg and a right foot. In the right right foot there was tingling and paresthesias and numbness of the right foot Her secondary pain was chronic neck pain radiating into the right shoulder and the right arm Today she would like to be treated for her primary pain which is her chronic low back pain Patient indicates that she was in a body cast for approximately 1 year Her back pain has been treated with various injections which gave her partial relief. She's also been treated with TENS which gave her no relief. He had chiropractic care which again gave her temporary partial relief Over the past 25 years which is the duration of her chronic pain syndrome she has received a variety of opioids from Darvocet to Vicodin and Tylenol and the current pain medication that she is taking now is a box he code 01 30 mg every 6 hours She has now been able to decrease the dose the dosing from 30 mg every 6 hours to 30 mg every 12 hours Her subjective pain intensity rating today is 60%. When the pain is severe her subjective pain intensity rating arises to 80%.  Pain medications Current pain medication is oxycodone 30 mg every 12 hours  Other medications Other medications include amlodipine and ranitidine fentanyl and gabapentin Cozaar Onglyza for diabetes fluoxetine potassium chloride Lipitor omeprazole Synthroid atenolol Claritin Imitrex promethazine Voltaren gel Xanax and Lasix  Allergies She is allergic to aspirin and possibly intravenous contrast dye  Past medical history Past medical history is positive for diabetes mellitus hypertension and hypercholesterolemia migraine congenital scoliosis  asthma. Depression and anxiety state endometriosis and gout  Past surgical history Past surgical history is positive for total abdominal hysterectomy and one-sided salpingo-oophorectomy for endometriosis and ovarian cysts, right carpal tunnel surgery and right foot surgery for bone spurs.  Social and economic history Patient does not smoke She does not use alcohol She does not use illicit drugs  Family history Patient is married for 32 years and has no children She indicates today that she has tried have children but because of endometriosis that was not possible Her mother is deceased at age 29 from demented and colon cancer Her father is deceased at age 51 from dementia and Alzheimer's disease She has 4 brothers who are alive and well she has one sister who is alive and well  Work status Patient is on full Social Security disability   HPI    Review of Systems  Constitutional: Negative.  Negative for fever, chills, diaphoresis, activity change, appetite change, fatigue and unexpected weight change.  HENT: Negative.  Negative for congestion, dental problem, drooling, ear discharge, ear pain, facial swelling, hearing loss, mouth sores, nosebleeds, postnasal drip, rhinorrhea and sinus pressure.   Eyes: Negative for photophobia, pain, discharge, redness, itching and visual disturbance.  Respiratory: Negative.  Negative for apnea, cough, choking, chest tightness, shortness of breath, wheezing and stridor.   Cardiovascular: Negative.  Negative for chest pain, palpitations and leg swelling.  Endocrine: Negative.   Genitourinary: Negative.   Musculoskeletal: Positive for myalgias, back pain, arthralgias, gait problem, neck pain and neck stiffness. Negative for joint swelling.  Skin:  Negative.  Negative for color change, pallor, rash and wound.  Allergic/Immunologic: Negative.   Neurological: Negative.  Negative for dizziness, tremors, seizures, syncope, facial asymmetry, speech  difficulty, weakness, light-headedness, numbness and headaches.  Hematological: Negative.   Psychiatric/Behavioral: Negative.        Objective:   Physical Exam  Constitutional: She is oriented to person, place, and time. No distress.  This patient has congenital scoliosis and was in a body cast for one year  HENT:  Head: Normocephalic and atraumatic.  Right Ear: External ear normal.  Left Ear: External ear normal.  Nose: Nose normal.  Mouth/Throat: Oropharynx is clear and moist.  Eyes: Conjunctivae and EOM are normal. Pupils are equal, round, and reactive to light. Right eye exhibits no discharge. Left eye exhibits no discharge. No scleral icterus.  Neck: No JVD present. No tracheal deviation present. No thyromegaly present.  Range of motion was decreased especially in extension and flexion  Cardiovascular: Normal rate, regular rhythm, normal heart sounds and intact distal pulses.  Exam reveals no gallop and no friction rub.   No murmur heard. Blood pressure was 102/83 mmHg Pulse was 112 bpm Equal and  Regular Heart sounds 1 and 2 were heard in all areas and there were no audible murmurs Temperature was 98.50F Respirations was 16 breaths per minute SPO2 was 99%  Pulmonary/Chest: Effort normal and breath sounds normal. No stridor. No respiratory distress. She has no wheezes. She has no rales. She exhibits no tenderness.  Abdominal: Soft. Bowel sounds are normal. She exhibits no distension. There is no tenderness. There is no rebound and no guarding.  Genitourinary:  Genitourinary exam was deferred  Musculoskeletal: She exhibits tenderness. She exhibits no edema.  There was significant thoracolumbar scoliosis Range of motion of the lower extremi was somewhat impaired Straight leg raising test on the right side was 60 degrees Straight leg raising test on the left side was 80 degrees Torsion test was mildly positive on both sides  Lymphadenopathy:    She has no cervical adenopathy.   Neurological: She is alert and oriented to person, place, and time. She has normal reflexes. She displays normal reflexes. No cranial nerve deficit. She exhibits normal muscle tone. Coordination normal.  Skin: Skin is warm and dry. No rash noted. She is not diaphoretic. No erythema. No pallor.  Psychiatric: She has a normal mood and affect. Her behavior is normal. Judgment and thought content normal.  Nursing note and vitals reviewed.         Assessment & Plan:   Assessment 1 chronic low back pain 2 lumbar degenerative disc disease 3 lumbar radiculopathy 4 status post congenital scoliosis 5 Diabetes mellitus 6 hypothyroidism 7 depression 8 anxiety state 9 chronic cervical pain 10 cervical degenerative disc disease 11 cervical radiculopathy   Plan of management 1 would plan a diagnostic/therapeutic  caudal epidural steroid injection 2 if that procedure is not effective then we'll plan to do imaging possibly MRI of the lumbar sacral spine 3 at that time based on the results of the MRI we will plan further interventional  procedures for her. 4 Will wait for a letter from her primary care physician who was prescribing the opioids to indicate that she would not be doing so and that I would be only 1 given her opioids for chronic pain management  New patient   level [place].D.

## 2014-09-30 NOTE — Progress Notes (Signed)
Safety precautions to be maintained throughout the outpatient stay will include: orient to surroundings, keep bed in low position, maintain call bell within reach at all times, provide assistance with transfer out of bed and ambulation.  

## 2014-09-30 NOTE — Patient Instructions (Signed)
Epidural Steroid Injection Patient Information  Description: The epidural space surrounds the nerves as they exit the spinal cord.  In some patients, the nerves can be compressed and inflamed by a bulging disc or a tight spinal canal (spinal stenosis).  By injecting steroids into the epidural space, we can bring irritated nerves into direct contact with a potentially helpful medication.  These steroids act directly on the irritated nerves and can reduce swelling and inflammation which often leads to decreased pain.  Epidural steroids may be injected anywhere along the spine and from the neck to the low back depending upon the location of your pain.   After numbing the skin with local anesthetic (like Novocaine), a small needle is passed into the epidural space slowly.  You may experience a sensation of pressure while this is being done.  The entire block usually last less than 10 minutes.  Conditions which may be treated by epidural steroids:   Low back and leg pain  Neck and arm pain  Spinal stenosis  Post-laminectomy syndrome  Herpes zoster (shingles) pain  Pain from compression fractures  Preparation for the injection:  1. Do not eat any solid food or dairy products within 6 hours of your appointment.  2. You may drink clear liquids up to 2 hours before appointment.  Clear liquids include water, black coffee, juice or soda.  No milk or cream please. 3. You may take your regular medication, including pain medications, with a sip of water before your appointment  Diabetics should hold regular insulin (if taken separately) and take 1/2 normal NPH dos the morning of the procedure.  Carry some sugar containing items with you to your appointment. 4. A driver must accompany you and be prepared to drive you home after your procedure.  5. Bring all your current medications with your. 6. An IV may be inserted and sedation may be given at the discretion of the physician.   7. A blood pressure  cuff, EKG and other monitors will often be applied during the procedure.  Some patients may need to have extra oxygen administered for a short period. 8. You will be asked to provide medical information, including your allergies, prior to the procedure.  We must know immediately if you are taking blood thinners (like Coumadin/Warfarin)  Or if you are allergic to IV iodine contrast (dye). We must know if you could possible be pregnant.  Possible side-effects:  Bleeding from needle site  Infection (rare, may require surgery)  Nerve injury (rare)  Numbness & tingling (temporary)  Difficulty urinating (rare, temporary)  Spinal headache ( a headache worse with upright posture)  Light -headedness (temporary)  Pain at injection site (several days)  Decreased blood pressure (temporary)  Weakness in arm/leg (temporary)  Pressure sensation in back/neck (temporary)  Call if you experience:  Fever/chills associated with headache or increased back/neck pain.  Headache worsened by an upright position.  New onset weakness or numbness of an extremity below the injection site  Hives or difficulty breathing (go to the emergency room)  Inflammation or drainage at the infection site  Severe back/neck pain  Any new symptoms which are concerning to you  Please note:  Although the local anesthetic injected can often make your back or neck feel good for several hours after the injection, the pain will likely return.  It takes 3-7 days for steroids to work in the epidural space.  You may not notice any pain relief for at least that one week.    If effective, we will often do a series of three injections spaced 3-6 weeks apart to maximally decrease your pain.  After the initial series, we generally will wait several months before considering a repeat injection of the same type.  If you have any questions, please call [phone] [facility] 

## 2014-10-01 ENCOUNTER — Telehealth: Payer: Self-pay | Admitting: Anesthesiology

## 2014-10-01 NOTE — Telephone Encounter (Signed)
spoke with patient, let her know that letter was faxed and confirmed

## 2014-10-01 NOTE — Telephone Encounter (Signed)
faxed letter to dr. Tomasita Crumble office to [phone]. received confirmation

## 2014-10-01 NOTE — Telephone Encounter (Signed)
Note to Dr Idelia Salm from [place]. [place] for patient / note left at nurses station

## 2014-10-02 ENCOUNTER — Ambulatory Visit: Payer: Medicare Other | Attending: Anesthesiology | Admitting: Anesthesiology

## 2014-10-02 ENCOUNTER — Encounter: Payer: Self-pay | Admitting: Anesthesiology

## 2014-10-02 VITALS — BP 103/52 | HR 80 | Temp 97.9°F | Resp 16 | Ht 61.0 in | Wt 150.0 lb

## 2014-10-02 DIAGNOSIS — G8929 Other chronic pain: Secondary | ICD-10-CM | POA: Diagnosis not present

## 2014-10-02 DIAGNOSIS — M545 Low back pain: Secondary | ICD-10-CM | POA: Diagnosis present

## 2014-10-02 DIAGNOSIS — M5441 Lumbago with sciatica, right side: Secondary | ICD-10-CM | POA: Diagnosis not present

## 2014-10-02 DIAGNOSIS — M5137 Other intervertebral disc degeneration, lumbosacral region: Secondary | ICD-10-CM | POA: Diagnosis not present

## 2014-10-02 DIAGNOSIS — M5136 Other intervertebral disc degeneration, lumbar region: Secondary | ICD-10-CM

## 2014-10-02 DIAGNOSIS — M5416 Radiculopathy, lumbar region: Secondary | ICD-10-CM

## 2014-10-02 DIAGNOSIS — M5431 Sciatica, right side: Secondary | ICD-10-CM | POA: Diagnosis not present

## 2014-10-02 MED ORDER — BUPIVACAINE HCL (PF) 0.25 % IJ SOLN: 30.0000 mL | Freq: Once | INTRAMUSCULAR | Status: DC

## 2014-10-02 MED ORDER — FENTANYL CITRATE (PF) 100 MCG/2ML IJ SOLN: 100.0000 ug | Freq: Once | INTRAMUSCULAR | Status: DC

## 2014-10-02 MED ORDER — BUPIVACAINE HCL (PF) 0.25 % IJ SOLN
INTRAMUSCULAR | Status: AC
  Administered 2014-10-02: 11:00:00
  Filled 2014-10-02: qty 30

## 2014-10-02 MED ORDER — OXYCODONE HCL 30 MG PO TABS: 15.0000 mg | ORAL_TABLET | Freq: Three times a day (TID) | ORAL | Status: DC

## 2014-10-02 MED ORDER — TRIAMCINOLONE ACETONIDE 40 MG/ML IJ SUSP (RADIOLOGY): 80.0000 mg | Freq: Once | INTRAMUSCULAR | Status: DC

## 2014-10-02 MED ORDER — TRIAMCINOLONE ACETONIDE 40 MG/ML IJ SUSP
INTRAMUSCULAR | Status: AC
  Filled 2014-10-02: qty 2

## 2014-10-02 MED ORDER — LACTATED RINGERS IV SOLN: 1000.0000 mL | INTRAVENOUS | Status: DC

## 2014-10-02 MED ORDER — MIDAZOLAM HCL 5 MG/5ML IJ SOLN
INTRAMUSCULAR | Status: AC
  Administered 2014-10-02: 1 mg via INTRAVENOUS
  Filled 2014-10-02: qty 5

## 2014-10-02 MED ORDER — MIDAZOLAM HCL 5 MG/5ML IJ SOLN
5.0000 mg | Freq: Once | INTRAMUSCULAR | Status: AC
  Administered 2014-10-02: 1 mg via INTRAVENOUS

## 2014-10-02 NOTE — Patient Instructions (Signed)

## 2014-10-02 NOTE — Procedures (Signed)
Date of procedure: 10/02/2014  Preoperative Diagnosis:  1 chronic low back pain 2 lumbar degenerative disc disease 3 lumbar radiculopathy  Postoperative Diagnosis:  Same.  Procedure: 1. Caudal epidural steroid injection, 2. Epidural with interpretation was not done because the patient was allergic to contrast media dye 3. Fluoroscopic guidance.  Surgeon: Lance Bosch, MD  Anesthesia: MAC anesthesia by the nursing staff under my direction  Informed consent was obtained and the patient appeared to accept and understand the benefits and risks of this procedure.   Pre procedure comments:  Patient was allergic to contrast media dye and as a consequence no Omnipaque was used  Description of the Procedure:  The patient was taken to the operating room and placed in the prone position.  Intravenous sedation and MAC anesthesia was administered by . After appropriate sedation, the sacrococcygeal area was prepped with Betadine.  After adequate draping, the area between the sacral cornu was palpated and infiltrated with 3 cc of 1% Lidocaine.   An AP fluoroscopic view of the sacrum was visualized and a 17 gauge Tuohy needle was inserted in the midline at the angle of 45 degrees through the sacrococcygeal membrane.  After making contact with the bone, the needle was withdrawn and readvanced in horizontal position, into the caudal epidural space.  Epidurogram Study: Epidurogram study was not performed because the patient was allergic to IV contrast dye  Comments:   No catheter was used. Epidurogram study was not performed because the patient was allergic to IV contrast dye  Caudal Epidural Steroid Injection:  Then 10 cc of 0.25% Bupivacaine and 80 mg of Kenalog were injected into the Caudal epidural space.  The needle was removed and adequate hemostasis was established.    The patient tolerated the procedure quite well and vital signs were stable.  There were no adverse  effects.  Additional comments:    The patient was taken to the recovery room in satisfactory condition where the patient was observed and subsequently discharged home.   Will follow up in the clinic in the next week.   Lance Bosch M.D.

## 2014-10-02 NOTE — Progress Notes (Signed)
Safety precautions to be maintained throughout the outpatient stay will include: orient to surroundings, keep bed in low position, maintain call bell within reach at all times, provide assistance with transfer out of bed and ambulation.  

## 2014-10-02 NOTE — Progress Notes (Signed)
   Subjective:    Patient ID: Deborah Blackwell, female    DOB: [birth year], 54 y.o.   MRN: [id number]  HPI    Review of Systems     Objective:   Physical Exam        Assessment & Plan:    Following the caudal epidural steroid injection the patient indicated to me that her medication was completed She brought in the L from Dr. Faith Rogue stating that he nor his colleagues would be given her any more opioid Therefore I will prescribe oxycodone 15 mg every 8 hours for 2 weeks  for her  Lance Bosch M.D.

## 2014-10-05 ENCOUNTER — Telehealth: Payer: Self-pay | Admitting: *Deleted

## 2014-10-05 NOTE — Telephone Encounter (Signed)
Left voicemail with patient.

## 2014-10-06 ENCOUNTER — Other Ambulatory Visit: Payer: Self-pay | Admitting: Anesthesiology

## 2014-10-14 ENCOUNTER — Encounter: Payer: Self-pay | Admitting: Anesthesiology

## 2014-10-14 ENCOUNTER — Ambulatory Visit: Payer: Medicare Other | Attending: Anesthesiology | Admitting: Anesthesiology

## 2014-10-14 VITALS — BP 123/86 | HR 82 | Temp 98.9°F | Resp 18 | Ht 61.0 in | Wt 150.0 lb

## 2014-10-14 DIAGNOSIS — M5431 Sciatica, right side: Secondary | ICD-10-CM

## 2014-10-14 DIAGNOSIS — M5126 Other intervertebral disc displacement, lumbar region: Secondary | ICD-10-CM | POA: Insufficient documentation

## 2014-10-14 DIAGNOSIS — F1190 Opioid use, unspecified, uncomplicated: Secondary | ICD-10-CM | POA: Insufficient documentation

## 2014-10-14 DIAGNOSIS — M5416 Radiculopathy, lumbar region: Secondary | ICD-10-CM

## 2014-10-14 DIAGNOSIS — M545 Low back pain: Secondary | ICD-10-CM | POA: Diagnosis present

## 2014-10-14 DIAGNOSIS — G8929 Other chronic pain: Secondary | ICD-10-CM | POA: Insufficient documentation

## 2014-10-14 DIAGNOSIS — M5441 Lumbago with sciatica, right side: Secondary | ICD-10-CM | POA: Diagnosis not present

## 2014-10-14 DIAGNOSIS — M79604 Pain in right leg: Secondary | ICD-10-CM | POA: Diagnosis present

## 2014-10-14 DIAGNOSIS — M1990 Unspecified osteoarthritis, unspecified site: Secondary | ICD-10-CM

## 2014-10-14 DIAGNOSIS — Z79891 Long term (current) use of opiate analgesic: Secondary | ICD-10-CM

## 2014-10-14 DIAGNOSIS — M79605 Pain in left leg: Secondary | ICD-10-CM | POA: Diagnosis present

## 2014-10-14 DIAGNOSIS — M5137 Other intervertebral disc degeneration, lumbosacral region: Secondary | ICD-10-CM | POA: Diagnosis not present

## 2014-10-14 DIAGNOSIS — M4120 Other idiopathic scoliosis, site unspecified: Secondary | ICD-10-CM

## 2014-10-14 MED ORDER — OXYCODONE HCL 30 MG PO TABS: 15.0000 mg | ORAL_TABLET | Freq: Three times a day (TID) | ORAL | Status: AC

## 2014-10-14 NOTE — Progress Notes (Signed)
Subjective:    Patient ID: Deborah Blackwell, female    DOB: 11-20-1960, 54 y.o.   MRN: [id number] This patient indicated that she did not get any significant relief from the caudal epidural steroid injection She continues to have pain which radiates down both lower extremities She has been taking opioids chronically for the past 5 years and I explained to her that we would begin to decrease the dosing for her Her subjective pain intensity rating today is 75% HPI    Review of Systems  Constitutional: Negative.   HENT: Negative.   Eyes: Negative.   Respiratory: Negative.   Cardiovascular: Negative.   Gastrointestinal: Negative.  Rectal pain: patient continues to have back pain and has some significant decrease in her range of motion.  Endocrine: Negative.   Genitourinary: Negative.   Musculoskeletal: Positive for myalgias, back pain, joint swelling, arthralgias, gait problem, neck pain and neck stiffness.       Patient continues to have chronic low back pain and she has a decrease in her range of motion She ambulates with the assistance of a walker Torsion test is positive and is indicative of facetogenic disease  Skin: Negative.   Allergic/Immunologic: Negative.   Hematological: Negative.   Psychiatric/Behavioral: Negative.        Objective:   Physical Exam  Constitutional: She appears well-developed and well-nourished. No distress.  HENT:  Head: Normocephalic and atraumatic.  Right Ear: External ear normal.  Left Ear: External ear normal.  Nose: Nose normal.  Mouth/Throat: Oropharynx is clear and moist.  Eyes: Conjunctivae and EOM are normal. Right eye exhibits no discharge. Left eye exhibits no discharge. No scleral icterus.  Neck: Normal range of motion. Neck supple. No JVD present. No tracheal deviation present. No thyromegaly present.  Cardiovascular: Normal rate, regular rhythm, normal heart sounds and intact distal pulses.  Exam reveals no gallop and no friction rub.    No murmur heard. Blood pressure is 123/86 mmHg Pulse is 82 bpm Equal and regular Heart sounds 1 and 2 were heard in all areas and there were no audible murmurs Temperature is 98.63F  Respirations are 18 breaths per minute SPO2 is 98%   Pulmonary/Chest: Effort normal. No respiratory distress. She has no wheezes. She has no rales. She exhibits no tenderness.  Abdominal: Soft. Bowel sounds are normal. She exhibits no distension. There is no tenderness. There is no rebound and no guarding.  Genitourinary:  Genitourinary exam deferred  Musculoskeletal: She exhibits no edema or tenderness.  Range of motion is significantly decreased and  patient ambulates with the assistance of a walker  Lymphadenopathy:    She has no cervical adenopathy.  Neurological: She is alert. She has normal reflexes. She displays normal reflexes. No cranial nerve deficit. She exhibits normal muscle tone.  Skin: Skin is warm and dry. No rash noted. She is not diaphoretic. No erythema. No pallor.  Psychiatric: She has a normal mood and affect. Her behavior is normal. Judgment and thought content normal.          Assessment & Plan:    Assessment 1 chronic low back pain 2 lumbar degenerative disc disease 3 lumbar radiculopathy 4 chronic opiate use   Plan of management 1 Will continue her on oxycodone 15 mg 3 times a day when necessary and give her 30 tabs 2 Will plan to order an MRI of her lumbar spine for her 3 Will follow-up with her in 3 weeks   Established patient  level III   [place].

## 2014-10-14 NOTE — Progress Notes (Signed)
Safety precautions to be maintained throughout the outpatient stay will include: orient to surroundings, keep bed in low position, maintain call bell within reach at all times, provide assistance with transfer out of bed and ambulation.  

## 2014-10-15 ENCOUNTER — Ambulatory Visit (INDEPENDENT_AMBULATORY_CARE_PROVIDER_SITE_OTHER): Payer: Medicare Other | Admitting: Psychiatry

## 2014-10-15 ENCOUNTER — Encounter: Payer: Self-pay | Admitting: Psychiatry

## 2014-10-15 VITALS — BP 130/86 | HR 89 | Temp 97.9°F | Ht 60.0 in | Wt 153.8 lb

## 2014-10-15 DIAGNOSIS — F332 Major depressive disorder, recurrent severe without psychotic features: Secondary | ICD-10-CM

## 2014-10-15 MED ORDER — FLUOXETINE HCL 40 MG PO CAPS: 40.0000 mg | ORAL_CAPSULE | Freq: Every day | ORAL | Status: DC

## 2014-10-15 MED ORDER — ARIPIPRAZOLE 10 MG PO TABS: 10.0000 mg | ORAL_TABLET | Freq: Every day | ORAL | Status: DC

## 2014-10-15 MED ORDER — ALPRAZOLAM 1 MG PO TABS: 1.0000 mg | ORAL_TABLET | Freq: Four times a day (QID) | ORAL | Status: DC

## 2014-10-15 NOTE — Progress Notes (Signed)
BH MD/PA/NP OP Progress Note  10/15/2014 11:01 AM Deborah Blackwell  MRN:  [id number]  Subjective:  Patient returns for follow-up of Deborah Blackwell major depressive disorder, severe without psychotic features. And anxiety. She states overall things have gone well for Deborah Blackwell. She does discuss long-term chronic stressors. She states these stressors are the health of Deborah Blackwell husband who she reports has significant heart disease and is expected to die from it. She states she also has a brother-in-law who also has extensive health problems. She does state that when she takes Deborah Blackwell medications she is fairly stable.  Deborah Blackwell initial appointment in August she was very concerned about pain. She has been engaged with a pain management doctor and feels like she is being heard and they are attempts to address Deborah Blackwell complaints.  States that she's sleeping about 4 hours a night. States Deborah Blackwell appetite is good. Chief Complaint: I take my medications on good Chief Complaint    Follow-up; Medication Refill     Visit Diagnosis:  No diagnosis found.  Past Medical History:  Past Medical History  Diagnosis Date  . History of scoliosis     was casted as a child  . Angina at rest   . Arthritis   . Asthma   . GERD (gastroesophageal reflux disease)   . COPD (chronic obstructive pulmonary disease)   . Diabetes mellitus without complication   . Hypertension   . Hyperlipidemia   . Gout   . RA (rheumatoid arthritis)   . History of kidney stones   . Migraines   . Depression   . Panic attack   . Bipolar disorder   . Congenital scoliosis   . DDD (degenerative disc disease), lumbar     Past Surgical History  Procedure Laterality Date  . Abdominal hysterectomy  2003    one ovary remains: due to heavy bleeding/endometrioma/cysts  . Carpal tunnel release    . Back surgery      and injections  . Foot surgery Right     bone spur  . Polypectomy      uterine   Family History:  Family History  Problem Relation Age of Onset  .  Alzheimer's disease Mother   . Dementia Mother   . Cancer Mother     colon  . Heart disease Mother   . Hypertension Mother   . Alzheimer's disease Father   . Dementia Father   . Hypertension Father   . Cancer Brother     lung  . Lung disease Brother   . Hypertension Sister    Social History:  Social History   Social History  . Marital Status: Married    Spouse Name: N/A  . Number of Children: N/A  . Years of Education: N/A   Social History Main Topics  . Smoking status: Former Smoker    Types: Cigarettes    Quit date: 02/02/1991  . Smokeless tobacco: Never Used  . Alcohol Use: No  . Drug Use: No  . Sexual Activity: No   Other Topics Concern  . None   Social History Narrative   Additional History:   Assessment:   Musculoskeletal: Strength & Muscle Tone: within normal limits Gait & Station: Walks slowly with a limp Patient leans: N/A  Psychiatric Specialty Exam: HPI  Review of Systems  Psychiatric/Behavioral: Negative for depression, suicidal ideas, hallucinations, memory loss and substance abuse. The patient is not nervous/anxious and does not have insomnia.     Blood pressure 130/86, pulse 89, temperature 97.9  F (36.6 C), temperature source Tympanic, height 5' (1.524 m), weight 153 lb 12.8 oz (69.763 kg), SpO2 96 %.Body mass index is 30.04 kg/(m^2).  General Appearance: Well Groomed  Eye Contact:  Good  Speech:  Normal Rate  Volume:  Normal  Mood:  Good  Affect:  , smiling  Thought Process:  Linear and Logical  Orientation:  Full (Time, Place, and Person)  Thought Content:  Negative  Suicidal Thoughts:  No  Homicidal Thoughts:  No  Memory:  Immediate;   Good Recent;   Good Remote;   Good  Judgement:  Good  Insight:  Good  Psychomotor Activity:  Negative  Concentration:  Good  Recall:  Good  Fund of Knowledge: Good  Language: Good  Akathisia:  Negative  Handed:  Right  AIMS (if indicated):  Done at last visit and was normal   Assets:   Communication Skills Desire for Improvement Social Support  ADL's:  Intact  Cognition: WNL  Sleep:  fair   Is the patient at risk to self?  No. Has the patient been a risk to self in the past 6 months?  No. Has the patient been a risk to self within the distant past?  Yes.   Is the patient a risk to others?  No. Has the patient been a risk to others in the past 6 months?  No. Has the patient been a risk to others within the distant past?  No.  Current Medications: Current Outpatient Prescriptions  Medication Sig Dispense Refill  . albuterol (VENTOLIN HFA) 108 (90 BASE) MCG/ACT inhaler Inhale 2 puffs into the lungs every 6 (six) hours as needed for wheezing or shortness of breath.    . ALPRAZolam (XANAX) 1 MG tablet Take 1 tablet (1 mg total) by mouth 4 (four) times daily. 120 tablet 2  . amLODipine (NORVASC) 10 MG tablet Take 10 mg by mouth daily.    . ARIPiprazole (ABILIFY) 10 MG tablet Take 1 tablet (10 mg total) by mouth daily. 30 tablet 2  . atenolol (TENORMIN) 50 MG tablet Take 50 mg by mouth 2 (two) times daily.    Marland Kitchen atorvastatin (LIPITOR) 40 MG tablet Take 40 mg by mouth at bedtime.    . budesonide-formoterol (SYMBICORT) 160-4.5 MCG/ACT inhaler Inhale 2 puffs into the lungs 2 (two) times daily.    . diclofenac sodium (VOLTAREN) 1 % GEL Apply topically 4 (four) times daily.    Marland Kitchen FLUoxetine (PROZAC) 40 MG capsule Take 1 capsule (40 mg total) by mouth daily. 30 capsule 2  . fluticasone (FLONASE) 50 MCG/ACT nasal spray Place 2 sprays into both nostrils daily.    . furosemide (LASIX) 40 MG tablet Take 40 mg by mouth daily.    Marland Kitchen levothyroxine (SYNTHROID, LEVOTHROID) 75 MCG tablet Take 1 tablet (75 mcg total) by mouth daily before breakfast. 30 tablet 1  . losartan (COZAAR) 100 MG tablet Take 100 mg by mouth daily.    . montelukast (SINGULAIR) 10 MG tablet Take 10 mg by mouth at bedtime.    Marland Kitchen omeprazole (PRILOSEC) 20 MG capsule Take 20 mg by mouth 2 (two) times daily before a meal.     . oxycodone (ROXICODONE) 30 MG immediate release tablet Take 0.5 tablets (15 mg total) by mouth every 8 (eight) hours. 30 tablet 0  . potassium chloride (K-DUR) 10 MEQ tablet Take 10 mEq by mouth daily.    . promethazine (PHENERGAN) 25 MG tablet Take 25 mg by mouth every 6 (six) hours as needed  for nausea or vomiting.    . saxagliptin HCl (ONGLYZA) 5 MG TABS tablet Take 5 mg by mouth daily.     Current Facility-Administered Medications  Medication Dose Route Frequency Jermane Brayboy Last Rate Last Dose  . bupivacaine (PF) (MARCAINE) 0.25 % injection 30 mL  30 mL Infiltration Once Lance Bosch, MD      . fentaNYL (SUBLIMAZE) injection 100 mcg  100 mcg Intravenous Once Lance Bosch, MD      . lactated ringers infusion 1,000 mL  1,000 mL Intravenous Continuous Lance Bosch, MD      . triamcinolone acetonide (KENALOG-40) injection (RADIOLOGY ONLY) 80 mg  80 mg Epidural Once Lance Bosch, MD        Medical Decision Making:  Established Problem, Stable/Improving (1) and Review of Medication Regimen & Side Effects (2)  Treatment Plan Summary:Medication management and Plan Major depressive disorder, recurrent severe without psychosis. We will continue Deborah Blackwell Prozac 40 mg a day, Abilify 10 mg daily.   Generalized anxieties order-continue Deborah Blackwell alprazolam 1 mg 4 times a day. Patient stable on these regimens. She'll follow-up in 2 months. She's been encouraged call any questions or concerns prior to Deborah Blackwell next point.   Faith Rogue 10/15/2014, 11:01 AM

## 2014-10-21 NOTE — Addendum Note (Signed)
Addended by: Lance Bosch on: 10/21/2014 02:19 PM   Modules accepted: Orders

## 2014-10-26 ENCOUNTER — Other Ambulatory Visit: Payer: Self-pay | Admitting: *Deleted

## 2014-10-26 DIAGNOSIS — M48061 Spinal stenosis, lumbar region without neurogenic claudication: Secondary | ICD-10-CM

## 2014-10-28 DIAGNOSIS — Z23 Encounter for immunization: Secondary | ICD-10-CM | POA: Diagnosis not present

## 2014-10-29 ENCOUNTER — Other Ambulatory Visit: Payer: Self-pay | Admitting: *Deleted

## 2014-10-29 DIAGNOSIS — M5137 Other intervertebral disc degeneration, lumbosacral region: Secondary | ICD-10-CM

## 2014-10-30 ENCOUNTER — Ambulatory Visit
Admission: RE | Admit: 2014-10-30 | Discharge: 2014-10-30 | Disposition: A | Discharge status: Home / Self Care | Payer: Medicare Other | Source: Ambulatory Visit | Attending: Anesthesiology | Admitting: Anesthesiology

## 2014-10-30 DIAGNOSIS — M48061 Spinal stenosis, lumbar region without neurogenic claudication: Secondary | ICD-10-CM

## 2014-10-30 DIAGNOSIS — M4806 Spinal stenosis, lumbar region: Secondary | ICD-10-CM | POA: Diagnosis not present

## 2014-10-30 DIAGNOSIS — M5126 Other intervertebral disc displacement, lumbar region: Secondary | ICD-10-CM | POA: Diagnosis not present

## 2014-10-30 DIAGNOSIS — M5137 Other intervertebral disc degeneration, lumbosacral region: Secondary | ICD-10-CM | POA: Diagnosis not present

## 2014-10-30 DIAGNOSIS — M545 Low back pain: Secondary | ICD-10-CM | POA: Diagnosis not present

## 2014-11-04 ENCOUNTER — Encounter: Payer: Self-pay | Admitting: Anesthesiology

## 2014-11-04 ENCOUNTER — Ambulatory Visit: Payer: Medicare Other | Attending: Anesthesiology | Admitting: Anesthesiology

## 2014-11-04 VITALS — BP 122/85 | HR 98 | Temp 97.3°F | Resp 18 | Ht 61.0 in | Wt 150.0 lb

## 2014-11-04 DIAGNOSIS — M5431 Sciatica, right side: Secondary | ICD-10-CM

## 2014-11-04 DIAGNOSIS — M5441 Lumbago with sciatica, right side: Secondary | ICD-10-CM | POA: Diagnosis not present

## 2014-11-04 DIAGNOSIS — Q675 Congenital deformity of spine: Secondary | ICD-10-CM

## 2014-11-04 DIAGNOSIS — M5416 Radiculopathy, lumbar region: Secondary | ICD-10-CM

## 2014-11-04 DIAGNOSIS — M5137 Other intervertebral disc degeneration, lumbosacral region: Secondary | ICD-10-CM | POA: Diagnosis not present

## 2014-11-04 DIAGNOSIS — G8929 Other chronic pain: Secondary | ICD-10-CM | POA: Diagnosis not present

## 2014-11-04 MED ORDER — OXYCODONE HCL 15 MG PO TABS: 15.0000 mg | ORAL_TABLET | Freq: Three times a day (TID) | ORAL | Status: DC | PRN

## 2014-11-04 NOTE — Progress Notes (Signed)
Safety precautions to be maintained throughout the outpatient stay will include: orient to surroundings, keep bed in low position, maintain call bell within reach at all times, provide assistance with transfer out of bed and ambulation.  

## 2014-11-04 NOTE — Patient Instructions (Signed)
Epidural Steroid Injection Patient Information  Description: The epidural space surrounds the nerves as they exit the spinal cord.  In some patients, the nerves can be compressed and inflamed by a bulging disc or a tight spinal canal (spinal stenosis).  By injecting steroids into the epidural space, we can bring irritated nerves into direct contact with a potentially helpful medication.  These steroids act directly on the irritated nerves and can reduce swelling and inflammation which often leads to decreased pain.  Epidural steroids may be injected anywhere along the spine and from the neck to the low back depending upon the location of your pain.   After numbing the skin with local anesthetic (like Novocaine), a small needle is passed into the epidural space slowly.  You may experience a sensation of pressure while this is being done.  The entire block usually last less than 10 minutes.  Conditions which may be treated by epidural steroids:   Low back and leg pain  Neck and arm pain  Spinal stenosis  Post-laminectomy syndrome  Herpes zoster (shingles) pain  Pain from compression fractures  Preparation for the injection:  1. Do not eat any solid food or dairy products within 6 hours of your appointment.  2. You may drink clear liquids up to 2 hours before appointment.  Clear liquids include water, black coffee, juice or soda.  No milk or cream please. 3. You may take your regular medication, including pain medications, with a sip of water before your appointment  Diabetics should hold regular insulin (if taken separately) and take 1/2 normal NPH dos the morning of the procedure.  Carry some sugar containing items with you to your appointment. 4. A driver must accompany you and be prepared to drive you home after your procedure.  5. Bring all your current medications with your. 6. An IV may be inserted and sedation may be given at the discretion of the physician.   7. A blood pressure  cuff, EKG and other monitors will often be applied during the procedure.  Some patients may need to have extra oxygen administered for a short period. 8. You will be asked to provide medical information, including your allergies, prior to the procedure.  We must know immediately if you are taking blood thinners (like Coumadin/Warfarin)  Or if you are allergic to IV iodine contrast (dye). We must know if you could possible be pregnant.  Possible side-effects:  Bleeding from needle site  Infection (rare, may require surgery)  Nerve injury (rare)  Numbness & tingling (temporary)  Difficulty urinating (rare, temporary)  Spinal headache ( a headache worse with upright posture)  Light -headedness (temporary)  Pain at injection site (several days)  Decreased blood pressure (temporary)  Weakness in arm/leg (temporary)  Pressure sensation in back/neck (temporary)  Call if you experience:  Fever/chills associated with headache or increased back/neck pain.  Headache worsened by an upright position.  New onset weakness or numbness of an extremity below the injection site  Hives or difficulty breathing (go to the emergency room)  Inflammation or drainage at the infection site  Severe back/neck pain  Any new symptoms which are concerning to you  Please note:  Although the local anesthetic injected can often make your back or neck feel good for several hours after the injection, the pain will likely return.  It takes 3-7 days for steroids to work in the epidural space.  You may not notice any pain relief for at least that one week.    If effective, we will often do a series of three injections spaced 3-6 weeks apart to maximally decrease your pain.  After the initial series, we generally will wait several months before considering a repeat injection of the same type.  If you have any questions, please call [phone] [facility] 

## 2014-11-04 NOTE — Progress Notes (Signed)
   Subjective:    Patient ID: Deborah Blackwell, female    DOB: Dec 18, 1960, 54 y.o.   MRN: [id number] This patient returned to the clinic today for continued management of her back pain She had a diagnostic caudal epidural steroid injection but she said the results were very short-lived Today his subjective pain intensity rating is 75% Patient had an MRI of her lumbar spine and the results showed moderate degenerative disc disease at L4-L5 and S1 Patient indicates that the pain is affecting her activities of daily living She also indicates that she has chronic cervical pain which radiates down into her right arm Eyes short her we would deal with that after we had completed treating her back  HPI    Review of Systems  Constitutional: Negative.   HENT: Negative.   Eyes: Negative.   Respiratory: Negative.   Cardiovascular: Negative.   Gastrointestinal: Negative.   Endocrine: Negative.   Genitourinary: Negative.   Musculoskeletal: Negative.   Skin: Negative.   Allergic/Immunologic: Negative.   Neurological: Negative.   Hematological: Negative.   Psychiatric/Behavioral:       Patient has depression and is under the care of Dr. Faith Blackwell       Objective:   Physical Exam  Constitutional: She appears well-developed and well-nourished. No distress.  HENT:  Head: Normocephalic and atraumatic.  Right Ear: External ear normal.  Left Ear: External ear normal.  Nose: Nose normal.  Mouth/Throat: Oropharynx is clear and moist. No oropharyngeal exudate.  Eyes: Conjunctivae and EOM are normal. Pupils are equal, round, and reactive to light. Right eye exhibits discharge. Left eye exhibits no discharge. No scleral icterus.  Neck: Normal range of motion. Neck supple. No JVD present. No tracheal deviation present. No thyromegaly present.  Cardiovascular: Normal rate, regular rhythm and intact distal pulses.  Exam reveals friction rub.   No murmur heard. We'll pressure is 122/85 mmHg Pulse is  98 bpm Equal and regular Respirations are 18 breaths per minute SPO2 was 99% Temperature was 97.3 her  Pulmonary/Chest: Effort normal and breath sounds normal. No respiratory distress. She has no wheezes. She has no rales. She exhibits no tenderness.  Abdominal: Soft. Bowel sounds are normal. She exhibits no distension and no mass. There is no tenderness. There is no rebound and no guarding.  Genitourinary:  Genitourinary examination was deferred  Musculoskeletal: Normal range of motion. She exhibits no edema or tenderness.  Lymphadenopathy:    She has no cervical adenopathy.  Neurological: She is alert. She has normal reflexes. She displays normal reflexes. No cranial nerve deficit. She exhibits normal muscle tone. Coordination normal.  Skin: Skin is warm and dry. No rash noted. She is not diaphoretic. No erythema. No pallor.  Psychiatric:  This patient has chronic depression and is under the care of the psychiatrist          Assessment & Plan:    Assessment 1 chronic low back pain 2 lumbar degenerative disc disease 3 lumbar radiculopathy 4 idiopathic scoliosis   Plan of management 1 bilateral lumbar transforaminal epidural steroid injection at L4 and L5 2 no contrast will be used for the procedure since patient is allergic to IV dye contrast media 3 Will begin the patient on Roxicodone 15 mg every 8H when necessary and give her 42 tablets 4 Will plan to perform the procedure for her in the next 10 days   Established patient    level III   Lance Bosch M.D.

## 2014-11-13 ENCOUNTER — Ambulatory Visit: Payer: Medicare Other | Attending: Anesthesiology | Admitting: Anesthesiology

## 2014-11-13 ENCOUNTER — Encounter: Payer: Self-pay | Admitting: Anesthesiology

## 2014-11-13 VITALS — BP 125/76 | HR 68 | Temp 98.9°F | Resp 20 | Ht 61.0 in | Wt 150.0 lb

## 2014-11-13 DIAGNOSIS — M5137 Other intervertebral disc degeneration, lumbosacral region: Secondary | ICD-10-CM | POA: Diagnosis not present

## 2014-11-13 DIAGNOSIS — M5431 Sciatica, right side: Secondary | ICD-10-CM

## 2014-11-13 DIAGNOSIS — M4120 Other idiopathic scoliosis, site unspecified: Secondary | ICD-10-CM

## 2014-11-13 DIAGNOSIS — M5136 Other intervertebral disc degeneration, lumbar region: Secondary | ICD-10-CM | POA: Insufficient documentation

## 2014-11-13 DIAGNOSIS — M5416 Radiculopathy, lumbar region: Secondary | ICD-10-CM

## 2014-11-13 DIAGNOSIS — M545 Low back pain: Secondary | ICD-10-CM | POA: Diagnosis not present

## 2014-11-13 DIAGNOSIS — G8929 Other chronic pain: Secondary | ICD-10-CM | POA: Diagnosis not present

## 2014-11-13 DIAGNOSIS — M5441 Lumbago with sciatica, right side: Secondary | ICD-10-CM | POA: Diagnosis not present

## 2014-11-13 MED ORDER — TRIAMCINOLONE ACETONIDE 40 MG/ML IJ SUSP
INTRAMUSCULAR | Status: AC
  Administered 2014-11-13: 12:00:00
  Filled 2014-11-13: qty 2

## 2014-11-13 MED ORDER — OXYCODONE HCL 15 MG PO TABS: 15.0000 mg | ORAL_TABLET | Freq: Three times a day (TID) | ORAL | Status: DC | PRN

## 2014-11-13 MED ORDER — MIDAZOLAM HCL 5 MG/5ML IJ SOLN
INTRAMUSCULAR | Status: AC
  Administered 2014-11-13: 2 mg via INTRAVENOUS
  Filled 2014-11-13: qty 5

## 2014-11-13 MED ORDER — BUPIVACAINE HCL (PF) 0.25 % IJ SOLN
INTRAMUSCULAR | Status: AC
  Administered 2014-11-13: 12:00:00
  Filled 2014-11-13: qty 30

## 2014-11-13 MED ORDER — FENTANYL CITRATE (PF) 100 MCG/2ML IJ SOLN
INTRAMUSCULAR | Status: AC
  Administered 2014-11-13: 50 ug via INTRAVENOUS
  Filled 2014-11-13: qty 2

## 2014-11-13 NOTE — Patient Instructions (Signed)
Epidural Steroid Injection An epidural steroid injection is given to relieve pain in your neck, back, or legs that is caused by the irritation or swelling of a nerve root. This procedure involves injecting a steroid and numbing medicine (anesthetic) into the epidural space. The epidural space is the space between the outer covering of your spinal cord and the bones that form your backbone (vertebra).  LET YOUR HEALTH CARE PROVIDER KNOW ABOUT:   Any allergies you have.  All medicines you are taking, including vitamins, herbs, eye drops, creams, and over-the-counter medicines such as aspirin.  Previous problems you or members of your family have had with the use of anesthetics.  Any blood disorders or blood clotting disorders you have.  Previous surgeries you have had.  Medical conditions you have. RISKS AND COMPLICATIONS Generally, this is a safe procedure. However, as with any procedure, complications can occur. Possible complications of epidural steroid injection include:  Headache.  Bleeding.  Infection.  Allergic reaction to the medicines.  Damage to your nerves. The response to this procedure depends on the underlying cause of the pain and its duration. People who have long-term (chronic) pain are less likely to benefit from epidural steroids than are those people whose pain comes on strong and suddenly. BEFORE THE PROCEDURE   Ask your health care provider about changing or stopping your regular medicines. You may be advised to stop taking blood-thinning medicines a few days before the procedure.  You may be given medicines to reduce anxiety.  Arrange for someone to take you home after the procedure. PROCEDURE   You will remain awake during the procedure. You may receive medicine to make you relaxed.  You will be asked to lie on your stomach.  The injection site will be cleaned.  The injection site will be numbed with a medicine (local anesthetic).  A needle will be  injected through your skin into the epidural space.  Your health care provider will use an X-ray machine to ensure that the steroid is delivered closest to the affected nerve. You may have minimal discomfort at this time.  Once the needle is in the right position, the local anesthetic and the steroid will be injected into the epidural space.  The needle will then be removed and a bandage will be applied to the injection site. AFTER THE PROCEDURE   You may be monitored for a short time before you go home.  You may feel weakness or numbness in your arm or leg, which disappears within hours.  You may be allowed to eat, drink, and take your regular medicine.  You may have soreness at the site of the injection.   This information is not intended to replace advice given to you by your health care provider. Make sure you discuss any questions you have with your health care provider.   Document Released: 04/11/2007 Document Revised: 09/04/2012 Document Reviewed: 06/21/2012 Else[employer] Interactive Patient Education 2016 [employer] Pain Management Discharge Instructions  General Discharge Instructions :  If you need to reach your doctor call: Monday-Friday 8:00 am - 4:00 pm at [phone] or toll free [phone].  After clinic hours [phone] to have operator reach doctor.  Bring all of your medication bottles to all your appointments in the pain clinic.  To cancel or reschedule your appointment with Pain Management please remember to call 24 hours in advance to avoid a fee.  Refer to the educational materials which you have been given on: General Risks, I had my Procedure.   Discharge Instructions, Post Sedation.  Post Procedure Instructions:  The drugs you were given will stay in your system until tomorrow, so for the next 24 hours you should not drive, make any legal decisions or drink any alcoholic beverages.  You may eat anything you prefer, but it is better to start with  liquids then soups and crackers, and gradually work up to solid foods.  Please notify your doctor immediately if you have any unusual bleeding, trouble breathing or pain that is not related to your normal pain.  Depending on the type of procedure that was done, some parts of your body may feel week and/or numb.  This usually clears up by tonight or the next day.  Walk with the use of an assistive device or accompanied by an adult for the 24 hours.  You may use ice on the affected area for the first 24 hours.  Put ice in a Ziploc bag and cover with a towel and place against area 15 minutes on 15 minutes off.  You may switch to heat after 24 hours. 

## 2014-11-13 NOTE — Procedures (Signed)
Date of procedure: 11/13/2014  Preoperative Diagnosis:  1 chronic low back pain 2 lumbar degenerative disc disease 3 lumbar radiculopathy  Postoperative Diagnosis:  Same.  Procedure: 1. Bilateral Lumbar transforaminal epidural steroid injection at L4 and L5 2. Epidurogram with interpretation. Was not done because the patient did not receive contrast media because she is allergic to all IV dyes 3. Fluoroscopic guidance.   Surgeon: Lance Bosch, MD  Anesthesia: MAC anesthesia by the nurse and staff under my direction  Informed consent was obtained and the patient appeared to accept and understand the benefits and risks of this procedure.   Pre procedure comments:  No IV contrast was used because of the patient's allergies  Description of the Procedure:  The patient was taken to the operating room and placed in the prone position.  Intravenous sedation and MAC anesthesia was administered by the nurse and staff under my direction.  After appropriate sedation, the lumbosacral area was prepped with Betadine.  Then the patient was fully draped.  Fluoroscopic view of the lumbar spine was done and the pedicle on the right side was visualized.  The skin over the 6 o'clock position of the pedicle on the right was infiltrated with 3 cc of 1% Lidocaine using a 25 gauge needle.  Adequate visualization of pedicle was obtained by using an oblique fluoroscopic view.  Then a 22 gauge 3 1/2 spinal needle was introduced through the skin and directed towards the 6 o'clock position of the pedicles at L 4 and L5.  The needles were advanced into the superior medical portion of the foramen at L 4 and L5.  Then a lateral view was utilized to ensure optimal needle position and depth in the foramen.   The neuroforaminal area on both the right and left sides were almost totally stenosed and it was not possible to block L5 Therefore the foramen at S1 was accessed and L4 and L5 was done on both  sides  Epidurogram with Interpretation: Epidurogram was not performed because the patient was allergic to contrast media   Transformaraminal Epidural Steroid Injection:  Again aspirations were negative for blood, CSF or other body fluids.  Then 1.5 cc of 0.25%Bupivacaine and 25 mg of Kenalog was injected in the L4 and S1 foramen.  Fluoroscopic view of the lumbar spine was done and the pedicle on the left side was visualized.  The skin over the 6 o'clock position of the pedicle on the left was infiltrated with 3 cc of 1% Lidocaine using a 25 gauge needle.  Adequate visualization of pedicle was obtained by using an oblique fluoroscopic view.  Then a 22 gauge 3 1/2 spinal needle was introduced through the skin and directed towards the 6 o'clock position of the pedicles at [facility] for and S1.  The needles were advanced into the superior medical portion of the foramen at L 4 and S1.  Then a lateral view was utilized to ensure optimal needle position and depth in the foramen.    Epidurogram with Interpretation: Epidurograms were done because the patient was allergic to IV contrast media  Transformaraminal Epidural Steroid Injection:  Again aspirations were negative for blood, CSF or other body fluids.  Then 1.5 cc of 0.25%Bupivacaine and 25 mg of Kenalog was injected in the L 4 and S1 foramen.  Additional comments:   The patient tolerated the procedures quite well.  The needles were removed.  Adequate hemostasis was established and vital signes were stable. The patient was taken to the  recovery room in satisfactory condition where the patient was observed and subsequently discharged home.  Will follow up in the clinic in the next week or thereabout.  Lance Bosch M.D.

## 2014-11-13 NOTE — Progress Notes (Signed)
Safety precautions to be maintained throughout the outpatient stay will include: orient to surroundings, keep bed in low position, maintain call bell within reach at all times, provide assistance with transfer out of bed and ambulation.  

## 2014-11-16 ENCOUNTER — Telehealth: Payer: Self-pay | Admitting: *Deleted

## 2014-11-16 NOTE — Telephone Encounter (Signed)
Left message to call our office with any problems or concerns.

## 2014-11-25 ENCOUNTER — Ambulatory Visit: Payer: Medicare Other | Attending: Anesthesiology | Admitting: Anesthesiology

## 2014-11-25 ENCOUNTER — Encounter: Payer: Self-pay | Admitting: Anesthesiology

## 2014-11-25 VITALS — BP 120/74 | HR 91 | Temp 98.0°F | Resp 18 | Ht 61.0 in | Wt 145.0 lb

## 2014-11-25 DIAGNOSIS — M5431 Sciatica, right side: Secondary | ICD-10-CM

## 2014-11-25 DIAGNOSIS — M5137 Other intervertebral disc degeneration, lumbosacral region: Secondary | ICD-10-CM | POA: Diagnosis not present

## 2014-11-25 DIAGNOSIS — M5116 Intervertebral disc disorders with radiculopathy, lumbar region: Secondary | ICD-10-CM | POA: Diagnosis not present

## 2014-11-25 DIAGNOSIS — G8929 Other chronic pain: Secondary | ICD-10-CM | POA: Diagnosis not present

## 2014-11-25 DIAGNOSIS — M5416 Radiculopathy, lumbar region: Secondary | ICD-10-CM

## 2014-11-25 DIAGNOSIS — M4120 Other idiopathic scoliosis, site unspecified: Secondary | ICD-10-CM | POA: Insufficient documentation

## 2014-11-25 DIAGNOSIS — Q675 Congenital deformity of spine: Secondary | ICD-10-CM

## 2014-11-25 DIAGNOSIS — M5441 Lumbago with sciatica, right side: Secondary | ICD-10-CM | POA: Diagnosis not present

## 2014-11-25 DIAGNOSIS — M545 Low back pain: Secondary | ICD-10-CM | POA: Diagnosis not present

## 2014-11-25 MED ORDER — OXYCODONE HCL 15 MG PO TABS: 15.0000 mg | ORAL_TABLET | Freq: Three times a day (TID) | ORAL | Status: DC | PRN

## 2014-11-25 NOTE — Patient Instructions (Addendum)
Epidural Steroid Injection Patient Information  Description: The epidural space surrounds the nerves as they exit the spinal cord.  In some patients, the nerves can be compressed and inflamed by a bulging disc or a tight spinal canal (spinal stenosis).  By injecting steroids into the epidural space, we can bring irritated nerves into direct contact with a potentially helpful medication.  These steroids act directly on the irritated nerves and can reduce swelling and inflammation which often leads to decreased pain.  Epidural steroids may be injected anywhere along the spine and from the neck to the low back depending upon the location of your pain.   After numbing the skin with local anesthetic (like Novocaine), a small needle is passed into the epidural space slowly.  You may experience a sensation of pressure while this is being done.  The entire block usually last less than 10 minutes.  Conditions which may be treated by epidural steroids:   Low back and leg pain  Neck and arm pain  Spinal stenosis  Post-laminectomy syndrome  Herpes zoster (shingles) pain  Pain from compression fractures  Preparation for the injection:  1. Do not eat any solid food or dairy products within 6 hours of your appointment.  2. You may drink clear liquids up to 2 hours before appointment.  Clear liquids include water, black coffee, juice or soda.  No milk or cream please. 3. You may take your regular medication, including pain medications, with a sip of water before your appointment  Diabetics should hold regular insulin (if taken separately) and take 1/2 normal NPH dos the morning of the procedure.  Carry some sugar containing items with you to your appointment. 4. A driver must accompany you and be prepared to drive you home after your procedure.  5. Bring all your current medications with your. 6. An IV may be inserted and sedation may be given at the discretion of the physician.   7. A blood pressure  cuff, EKG and other monitors will often be applied during the procedure.  Some patients may need to have extra oxygen administered for a short period. 8. You will be asked to provide medical information, including your allergies, prior to the procedure.  We must know immediately if you are taking blood thinners (like Coumadin/Warfarin)  Or if you are allergic to IV iodine contrast (dye). We must know if you could possible be pregnant.  Possible side-effects:  Bleeding from needle site  Infection (rare, may require surgery)  Nerve injury (rare)  Numbness & tingling (temporary)  Difficulty urinating (rare, temporary)  Spinal headache ( a headache worse with upright posture)  Light -headedness (temporary)  Pain at injection site (several days)  Decreased blood pressure (temporary)  Weakness in arm/leg (temporary)  Pressure sensation in back/neck (temporary)  Call if you experience:  Fever/chills associated with headache or increased back/neck pain.  Headache worsened by an upright position.  New onset weakness or numbness of an extremity below the injection site  Hives or difficulty breathing (go to the emergency room)  Inflammation or drainage at the infection site  Severe back/neck pain  Any new symptoms which are concerning to you  Please note:  Although the local anesthetic injected can often make your back or neck feel good for several hours after the injection, the pain will likely return.  It takes 3-7 days for steroids to work in the epidural space.  You may not notice any pain relief for at least that one week.    If effective, we will often do a series of three injections spaced 3-6 weeks apart to maximally decrease your pain.  After the initial series, we generally will wait several months before considering a repeat injection of the same type.  If you have any questions, please call [phone] [place] were given a  prescription for Oxycodone today

## 2014-11-25 NOTE — Progress Notes (Signed)
Safety precautions to be maintained throughout the outpatient stay will include: orient to surroundings, keep bed in low position, maintain call bell within reach at all times, provide assistance with transfer out of bed and ambulation.  

## 2014-11-27 NOTE — Progress Notes (Signed)
   Subjective:    Patient ID: Deborah Blackwell, female    DOB: December 19, 1960, 54 y.o.   MRN: [id number]  HPI  This patient returned to the clinic today indicating that she got no relief from her last procedure She indicated that she had trouble sleeping properly but she is on multiple medications at this time. Her subjective pain intensity rating is 80% She continues to use oxycodone 3 times a day to control her pain There are no new developments and she appreciates that this is just one injection she has and that she may require more than one injection  Review of Systems  Constitutional: Negative.   HENT: Negative.   Eyes: Negative.   Respiratory: Negative.   Cardiovascular: Negative.   Gastrointestinal: Negative.   Endocrine: Negative.   Genitourinary: Negative.   Musculoskeletal: Positive for back pain, arthralgias and gait problem. Negative for myalgias, joint swelling, neck pain and neck stiffness.  Skin: Negative.   Allergic/Immunologic: Negative.   Neurological: Negative.   Hematological: Negative.   Psychiatric/Behavioral: Negative.        Objective:   Physical Exam  Cardiovascular:  Patient appeared to be in mild distress Her blood pressure was 120/74 mmHg Pulse is 91 bpm Equal and regular Heart sounds 1 and 2 were heard in all areas There were no audible murmurs Temperature was 54F Respirations were 18 breaths per minute SPO2 was 97% Chest is clinically clear There were no adventitious sounds Abdomen is soft and nontender No palpable organomegaly There is no significant lymphadenopathy Pupils are equal and reactive Cranial nerves are intact There are no new neurological or musculoskeletal findings  Nursing note and vitals reviewed.         Assessment & Plan:    Assessment 1 chronic low back pain 2 lumbar degenerative disc disease 3 lumbar radiculopathy 4 idiopathic scoliosis   Plan of management 1 We'll plan to repeat the bilateral transforaminal  epidural steroid injection at L4 and L5 2 Will refill her medication oxycodone 15 mg every 8 history of present illness rating for pain and give her 42 pills 3 Will plan to perform the procedure in 1 week's time   Established patient      level III   Lance Bosch M.D.

## 2014-12-04 ENCOUNTER — Encounter: Payer: Self-pay | Admitting: Anesthesiology

## 2014-12-04 ENCOUNTER — Ambulatory Visit: Payer: Medicare Other | Attending: Anesthesiology | Admitting: Anesthesiology

## 2014-12-04 VITALS — BP 142/80 | HR 73 | Temp 98.2°F | Resp 16 | Ht 61.0 in | Wt 145.0 lb

## 2014-12-04 DIAGNOSIS — M5137 Other intervertebral disc degeneration, lumbosacral region: Secondary | ICD-10-CM | POA: Diagnosis not present

## 2014-12-04 DIAGNOSIS — M5431 Sciatica, right side: Secondary | ICD-10-CM

## 2014-12-04 DIAGNOSIS — M5441 Lumbago with sciatica, right side: Secondary | ICD-10-CM | POA: Diagnosis not present

## 2014-12-04 DIAGNOSIS — G8929 Other chronic pain: Secondary | ICD-10-CM | POA: Diagnosis not present

## 2014-12-04 DIAGNOSIS — M4806 Spinal stenosis, lumbar region: Secondary | ICD-10-CM | POA: Diagnosis not present

## 2014-12-04 DIAGNOSIS — M5116 Intervertebral disc disorders with radiculopathy, lumbar region: Secondary | ICD-10-CM | POA: Insufficient documentation

## 2014-12-04 DIAGNOSIS — M545 Low back pain: Secondary | ICD-10-CM | POA: Insufficient documentation

## 2014-12-04 DIAGNOSIS — M5416 Radiculopathy, lumbar region: Secondary | ICD-10-CM

## 2014-12-04 MED ORDER — OXYCODONE HCL 15 MG PO TABS: 15.0000 mg | ORAL_TABLET | Freq: Three times a day (TID) | ORAL | Status: DC | PRN

## 2014-12-04 MED ORDER — FENTANYL CITRATE (PF) 100 MCG/2ML IJ SOLN
INTRAMUSCULAR | Status: AC
  Administered 2014-12-04: 50 ug via INTRAVENOUS
  Filled 2014-12-04: qty 2

## 2014-12-04 MED ORDER — MIDAZOLAM HCL 5 MG/5ML IJ SOLN
INTRAMUSCULAR | Status: AC
  Administered 2014-12-04: 2 mg via INTRAVENOUS
  Filled 2014-12-04: qty 5

## 2014-12-04 MED ORDER — TRIAMCINOLONE ACETONIDE 40 MG/ML IJ SUSP
INTRAMUSCULAR | Status: AC
  Administered 2014-12-04: 80 mg
  Filled 2014-12-04: qty 2

## 2014-12-04 MED ORDER — BUPIVACAINE HCL (PF) 0.25 % IJ SOLN
INTRAMUSCULAR | Status: AC
  Administered 2014-12-04: 30 mL
  Filled 2014-12-04: qty 30

## 2014-12-04 NOTE — Progress Notes (Signed)
   Subjective:    Patient ID: Deborah Blackwell, female    DOB: [birth year], 54 y.o.   MRN: [id number]  HPI    Review of Systems     Objective:   Physical Exam        Assessment & Plan:   This patient's medication was finished about to finish and we refilled her Roxicodone 15 mg every 8 hours. After given her 42 pills and will see her back in 2 weeks  Lance Bosch M.D.

## 2014-12-04 NOTE — Procedures (Signed)
Date of procedure: 12/04/2014  Preoperative Diagnosis:  1 chronic low back pain 2 lumbar degenerative disc disease 3 lumbar radiculopathy Lumbar spinal stenosis  Postoperative Diagnosis:  Same.  Procedure: 1. Bilateral Lumbar transforaminal epidural steroid injection at L4 on L5 2. Epidurogram with interpretation would not be done since the patient is allergic to contrast media 3. Fluoroscopic guidance.   Surgeon: Lance Bosch, MD  Anesthesia: MAC anesthesia by the nursing staff under my direction  Informed consent was obtained and the patient appeared to accept and understand the benefits and risks of this procedure.   Pre procedure comments:  This patient was allergic to IV dye and so contrast was not going to be used for this procedure  Description of the Procedure:  The patient was taken to the operating room and placed in the prone position.  Intravenous sedation and MAC anesthesia was administered by the nurse and staff under my direction.  After appropriate sedation, the lumbosacral area was prepped with Betadine.  Then the patient was fully draped.  Fluoroscopic view of the lumbar spine was done and the pedicle on the right side was visualized.  The skin over the 6 o'clock position of the pedicle on the right was infiltrated with 3 cc of 1% Lidocaine using a 25 gauge needle.   Adequate visualization of pedicle was obtained by using an oblique fluoroscopic view.   Then a 22 gauge 3 1/2 spinal needle was introduced through the skin and directed towards the 6 o'clock position of the pedicles at L 4 and L5.  The needles were advanced into the superior medical portion of the foramen at L 4 and L5.   Then a lateral view was utilized to ensure optimal needle position and depth in the foramen.    Epidurogram with Interpretation:  After negative aspiration, each needle was injected visualized for precise location of the tip of the needle using both the oblique and the  lateral views  No contrast was used.  Transformaraminal Epidural Steroid Injection:  Again aspirations were negative for blood, CSF or other body fluids.   Then 1.5 cc of 0.25%Bupivacaine and 20 mg of Kenalog was injected in the L 4 and L5  foramen.  Fluoroscopic view of the lumbar spine was done and the pedicle on the left side was visualized.  The skin over the 6 o'clock position of the pedicle on the left was infiltrated with 3 cc of 1% Lidocaine using a 25 gauge needle.  Adequate visualization of pedicle was obtained by using an oblique fluoroscopic view.  Then a 22 gauge 3 1/2 spinal needle was introduced through the skin and directed towards the 6 o'clock position of the pedicles at L 4 and L5.  The needles were advanced into the superior medical portion of the foramen at L 4 and L5.  Then a lateral view was utilized to ensure optimal needle position and depth in the foramen.    Epidurogram with Interpretation: The patient was allergic to contrast media so no epidurography was performed As a consequence no contrast was used so needle placement was verified by careful delineation of the tip of the needle in both the oblique and the lateral views  No intravascular or intrathecal spread was observed at any level and there were no corresponding paresthesias upon injection.   Epidurograms were visualized along with spread of contrast media into the exiting nerve root.  Transformaraminal Epidural Steroid Injection:  Again aspirations were negative for blood, CSF or other body fluids.  Then 1.5 cc of 0.25%Bupivacaine and 20 mg of Kenalog were injected in the L 4 and L5 foramen.   Additional comments:  This procedure was done using fluoroscopic guidance This topic time was 1.1 minutes Number of fluoroscopic frames were 4 MG Y was 18.3  The patient tolerated the procedures quite well.   The needles were removed.   Adequate hemostasis was established and vital signes were stable.  The  patient was taken to the recovery room in satisfactory condition where the patient was observed and subsequently discharged home.   Will follow up in the clinic in the next 2 weeks or thereabout.   Lance Bosch M.D.

## 2014-12-04 NOTE — Progress Notes (Signed)
Safety precautions to be maintained throughout the outpatient stay will include: orient to surroundings, keep bed in low position, maintain call bell within reach at all times, provide assistance with transfer out of bed and ambulation.  

## 2014-12-04 NOTE — Patient Instructions (Signed)
Pain Management Discharge Instructions  General Discharge Instructions :  If you need to reach your doctor call: Monday-Friday 8:00 am - 4:00 pm at [phone] or toll free [phone].  After clinic hours [phone] to have operator reach doctor.  Bring all of your medication bottles to all your appointments in the pain clinic.  To cancel or reschedule your appointment with Pain Management please remember to call 24 hours in advance to avoid a fee.  Refer to the educational materials which you have been given on: General Risks, I had my Procedure. Discharge Instructions, Post Sedation.  Post Procedure Instructions:  The drugs you were given will stay in your system until tomorrow, so for the next 24 hours you should not drive, make any legal decisions or drink any alcoholic beverages.  You may eat anything you prefer, but it is better to start with liquids then soups and crackers, and gradually work up to solid foods.  Please notify your doctor immediately if you have any unusual bleeding, trouble breathing or pain that is not related to your normal pain.  Depending on the type of procedure that was done, some parts of your body may feel week and/or numb.  This usually clears up by tonight or the next day.  Walk with the use of an assistive device or accompanied by an adult for the 24 hours.  You may use ice on the affected area for the first 24 hours.  Put ice in a Ziploc bag and cover with a towel and place against area 15 minutes on 15 minutes off.  You may switch to heat after 24 hours.Epidural Steroid Injection Patient Information  Description: The epidural space surrounds the nerves as they exit the spinal cord.  In some patients, the nerves can be compressed and inflamed by a bulging disc or a tight spinal canal (spinal stenosis).  By injecting steroids into the epidural space, we can bring irritated nerves into direct contact with a potentially helpful medication.  These  steroids act directly on the irritated nerves and can reduce swelling and inflammation which often leads to decreased pain.  Epidural steroids may be injected anywhere along the spine and from the neck to the low back depending upon the location of your pain.   After numbing the skin with local anesthetic (like Novocaine), a small needle is passed into the epidural space slowly.  You may experience a sensation of pressure while this is being done.  The entire block usually last less than 10 minutes.  Conditions which may be treated by epidural steroids:   Low back and leg pain  Neck and arm pain  Spinal stenosis  Post-laminectomy syndrome  Herpes zoster (shingles) pain  Pain from compression fractures  Preparation for the injection:  1. Do not eat any solid food or dairy products within 6 hours of your appointment.  2. You may drink clear liquids up to 2 hours before appointment.  Clear liquids include water, black coffee, juice or soda.  No milk or cream please. 3. You may take your regular medication, including pain medications, with a sip of water before your appointment  Diabetics should hold regular insulin (if taken separately) and take 1/2 normal NPH dos the morning of the procedure.  Carry some sugar containing items with you to your appointment. 4. A driver must accompany you and be prepared to drive you home after your procedure.  5. Bring all your current medications with your. 6. An IV may be inserted and   sedation may be given at the discretion of the physician.   7. A blood pressure cuff, EKG and other monitors will often be applied during the procedure.  Some patients may need to have extra oxygen administered for a short period. 8. You will be asked to provide medical information, including your allergies, prior to the procedure.  We must know immediately if you are taking blood thinners (like Coumadin/Warfarin)  Or if you are allergic to IV iodine contrast (dye). We must  know if you could possible be pregnant.  Possible side-effects:  Bleeding from needle site  Infection (rare, may require surgery)  Nerve injury (rare)  Numbness & tingling (temporary)  Difficulty urinating (rare, temporary)  Spinal headache ( a headache worse with upright posture)  Light -headedness (temporary)  Pain at injection site (several days)  Decreased blood pressure (temporary)  Weakness in arm/leg (temporary)  Pressure sensation in back/neck (temporary)  Call if you experience:  Fever/chills associated with headache or increased back/neck pain.  Headache worsened by an upright position.  New onset weakness or numbness of an extremity below the injection site  Hives or difficulty breathing (go to the emergency room)  Inflammation or drainage at the infection site  Severe back/neck pain  Any new symptoms which are concerning to you  Please note:  Although the local anesthetic injected can often make your back or neck feel good for several hours after the injection, the pain will likely return.  It takes 3-7 days for steroids to work in the epidural space.  You may not notice any pain relief for at least that one week.  If effective, we will often do a series of three injections spaced 3-6 weeks apart to maximally decrease your pain.  After the initial series, we generally will wait several months before considering a repeat injection of the same type.  If you have any questions, please call [phone] [facility] 

## 2014-12-07 ENCOUNTER — Telehealth: Payer: Self-pay | Admitting: *Deleted

## 2014-12-07 NOTE — Telephone Encounter (Signed)
Call back complete

## 2014-12-15 ENCOUNTER — Ambulatory Visit: Payer: Medicare Other | Admitting: Psychiatry

## 2014-12-16 ENCOUNTER — Ambulatory Visit: Payer: Medicare Other | Attending: Anesthesiology | Admitting: Anesthesiology

## 2014-12-16 ENCOUNTER — Encounter: Payer: Self-pay | Admitting: Anesthesiology

## 2014-12-16 VITALS — BP 150/91 | HR 111 | Temp 98.9°F | Resp 18 | Ht 61.0 in | Wt 144.0 lb

## 2014-12-16 DIAGNOSIS — M5030 Other cervical disc degeneration, unspecified cervical region: Secondary | ICD-10-CM | POA: Diagnosis not present

## 2014-12-16 DIAGNOSIS — M5010 Cervical disc disorder with radiculopathy, unspecified cervical region: Secondary | ICD-10-CM | POA: Diagnosis not present

## 2014-12-16 DIAGNOSIS — M545 Low back pain: Secondary | ICD-10-CM | POA: Diagnosis not present

## 2014-12-16 DIAGNOSIS — M5136 Other intervertebral disc degeneration, lumbar region: Secondary | ICD-10-CM | POA: Insufficient documentation

## 2014-12-16 DIAGNOSIS — M549 Dorsalgia, unspecified: Secondary | ICD-10-CM | POA: Diagnosis present

## 2014-12-16 DIAGNOSIS — F1190 Opioid use, unspecified, uncomplicated: Secondary | ICD-10-CM | POA: Diagnosis not present

## 2014-12-16 DIAGNOSIS — F329 Major depressive disorder, single episode, unspecified: Secondary | ICD-10-CM

## 2014-12-16 DIAGNOSIS — G8929 Other chronic pain: Secondary | ICD-10-CM | POA: Diagnosis not present

## 2014-12-16 DIAGNOSIS — M5412 Radiculopathy, cervical region: Secondary | ICD-10-CM | POA: Diagnosis not present

## 2014-12-16 DIAGNOSIS — M542 Cervicalgia: Secondary | ICD-10-CM | POA: Diagnosis present

## 2014-12-16 DIAGNOSIS — F32A Depression, unspecified: Secondary | ICD-10-CM

## 2014-12-16 MED ORDER — OXYCODONE HCL 15 MG PO TABS: 15.0000 mg | ORAL_TABLET | Freq: Two times a day (BID) | ORAL | Status: DC

## 2014-12-16 NOTE — Patient Instructions (Signed)
Epidural Steroid Injection Patient Information  Description: The epidural space surrounds the nerves as they exit the spinal cord.  In some patients, the nerves can be compressed and inflamed by a bulging disc or a tight spinal canal (spinal stenosis).  By injecting steroids into the epidural space, we can bring irritated nerves into direct contact with a potentially helpful medication.  These steroids act directly on the irritated nerves and can reduce swelling and inflammation which often leads to decreased pain.  Epidural steroids may be injected anywhere along the spine and from the neck to the low back depending upon the location of your pain.   After numbing the skin with local anesthetic (like Novocaine), a small needle is passed into the epidural space slowly.  You may experience a sensation of pressure while this is being done.  The entire block usually last less than 10 minutes.  Conditions which may be treated by epidural steroids:   Low back and leg pain  Neck and arm pain  Spinal stenosis  Post-laminectomy syndrome  Herpes zoster (shingles) pain  Pain from compression fractures  Preparation for the injection:  1. Do not eat any solid food or dairy products within 6 hours of your appointment.  2. You may drink clear liquids up to 2 hours before appointment.  Clear liquids include water, black coffee, juice or soda.  No milk or cream please. 3. You may take your regular medication, including pain medications, with a sip of water before your appointment  Diabetics should hold regular insulin (if taken separately) and take 1/2 normal NPH dos the morning of the procedure.  Carry some sugar containing items with you to your appointment. 4. A driver must accompany you and be prepared to drive you home after your procedure.  5. Bring all your current medications with your. 6. An IV may be inserted and sedation may be given at the discretion of the physician.   7. A blood pressure  cuff, EKG and other monitors will often be applied during the procedure.  Some patients may need to have extra oxygen administered for a short period. 8. You will be asked to provide medical information, including your allergies, prior to the procedure.  We must know immediately if you are taking blood thinners (like Coumadin/Warfarin)  Or if you are allergic to IV iodine contrast (dye). We must know if you could possible be pregnant.  Possible side-effects:  Bleeding from needle site  Infection (rare, may require surgery)  Nerve injury (rare)  Numbness & tingling (temporary)  Difficulty urinating (rare, temporary)  Spinal headache ( a headache worse with upright posture)  Light -headedness (temporary)  Pain at injection site (several days)  Decreased blood pressure (temporary)  Weakness in arm/leg (temporary)  Pressure sensation in back/neck (temporary)  Call if you experience:  Fever/chills associated with headache or increased back/neck pain.  Headache worsened by an upright position.  New onset weakness or numbness of an extremity below the injection site  Hives or difficulty breathing (go to the emergency room)  Inflammation or drainage at the infection site  Severe back/neck pain  Any new symptoms which are concerning to you  Please note:  Although the local anesthetic injected can often make your back or neck feel good for several hours after the injection, the pain will likely return.  It takes 3-7 days for steroids to work in the epidural space.  You may not notice any pain relief for at least that one week.    If effective, we will often do a series of three injections spaced 3-6 weeks apart to maximally decrease your pain.  After the initial series, we generally will wait several months before considering a repeat injection of the same type.  If you have any questions, please call [phone] [facility] 

## 2014-12-16 NOTE — Progress Notes (Signed)
Safety precautions to be maintained throughout the outpatient stay will include: orient to surroundings, keep bed in low position, maintain call bell within reach at all times, provide assistance with transfer out of bed and ambulation.  

## 2014-12-16 NOTE — Progress Notes (Signed)
   Subjective:    Patient ID: Deborah Blackwell, female    DOB: 09/18/1960, 54 y.o.   MRN: [id number] Patient returned to the clinic today indicating that she is feeling much better Subjective pain intensity rating today is 40% His represents a significant decrease than the past 2 visits when her pain was as high as 80% I explained to her that it may not be that this third injection was better than the second on the first but that the cumulative effects of the 3 injections resulted in her decrease in pain today Today she indicates that while her back is improving her neck pain is getting worse and that she has a right cervical radiculopathy associated with her neck pain  I told her rather than doing an MRI of the cervical spine I would do were diagnostic translaminar cervical epidural steroid injection at C7/T1 for her and if she does not get dramatic relief at that point I'll send her for a cervical MRI She agreed with that plan Since her pain was decreasing to a current level of 40% I would decrease her Roxicodone from 3 time a day dose to twice a day dose HPI    Review of Systems  Constitutional: Negative.   HENT: Negative.   Respiratory: Negative.   Cardiovascular: Negative.   Gastrointestinal: Negative.   Endocrine: Negative.   Genitourinary: Negative.   Musculoskeletal: Positive for myalgias, back pain, joint swelling, arthralgias, gait problem, neck pain and neck stiffness.       Her back pain today has decreased significantly to the attention now to her neck pain which is increasing and this is associated with her right arm radiculopathy We'll plan doing a diagnostic translaminar cervical epidural steroid injection for her to treat her If that is not dramatically improved we'll plan then to perform a cervical MRI for her.  Skin: Negative.   Allergic/Immunologic: Negative.   Neurological: Negative.   Hematological: Negative.   Psychiatric/Behavioral: Negative.        Objective:   Physical Exam  Cardiovascular:  Patient appears quite relaxed and is in no distress She indicates that her subjective pain intensity rating today is 40% Her blood pressure is 150/91 mmHg Her pulse is 101 bpm Equal and regular At sounds 1 and 2 were heard in all areas There were no audible murmurs Temperature was 98.61F Respirations were 18 breaths per minute SPO2 was 97% Chest is clinically clear There are no adventitious sounds Abdomen is soft and nontender No palpable organomegaly There is no significant lymphadenopathy Pupils are equal and reactive Cranial nerves are intact There are no new neurological or musculoskeletal findings  Nursing note and vitals reviewed.         Assessment & Plan:   Assessment 1 chronic neck pain 2 cervical degenerative disc disease 3 cervical radicular radiculopathy 4 chronic low back pain 5 lumbar degenerative disc disease 6 chronic opioid use    Plan of management 1 Will plan diagnostic translaminar cervical epidural steroid injection at C7-T1 for her 2 Will decrease her medication Roxicodone 15 mg every every 12 hours and give her 30 tablets 3 Will plan to perform the procedure for her on December 12 and if she is not dramatically improved we'll plan to perform a MRI of the cervical spine for her at that time   Established patient        level III    Lance Bosch M.D.

## 2014-12-16 NOTE — Addendum Note (Signed)
Addended by: Lance Bosch on: 12/16/2014 12:22 PM   Modules accepted: Orders

## 2014-12-22 ENCOUNTER — Telehealth: Payer: Self-pay | Admitting: Anesthesiology

## 2014-12-22 NOTE — Telephone Encounter (Signed)
Spoke with patient, she states that this last Rx  Was written for bid and before she was taking the roxicodone tid.  States she can not make it on bid and she has had to take more.  She has an appt with Dr Idelia Salm on 12/28/2014 and patient was encouraged to discuss this with Dr Idelia Salm at that time, as he is not in office until that time.

## 2014-12-22 NOTE — Telephone Encounter (Signed)
meds were 3 per day but last script was 2 per day hurting too bad with only 2 per day / please call

## 2014-12-28 ENCOUNTER — Ambulatory Visit: Payer: Medicare Other | Attending: Anesthesiology | Admitting: Anesthesiology

## 2014-12-28 ENCOUNTER — Encounter: Payer: Self-pay | Admitting: Anesthesiology

## 2014-12-28 VITALS — BP 121/82 | HR 108 | Temp 98.1°F | Resp 16 | Ht 61.0 in | Wt 144.0 lb

## 2014-12-28 DIAGNOSIS — M542 Cervicalgia: Secondary | ICD-10-CM | POA: Diagnosis not present

## 2014-12-28 DIAGNOSIS — M5010 Cervical disc disorder with radiculopathy, unspecified cervical region: Secondary | ICD-10-CM

## 2014-12-28 DIAGNOSIS — M5030 Other cervical disc degeneration, unspecified cervical region: Secondary | ICD-10-CM | POA: Insufficient documentation

## 2014-12-28 DIAGNOSIS — G8929 Other chronic pain: Secondary | ICD-10-CM | POA: Diagnosis not present

## 2014-12-28 DIAGNOSIS — M5412 Radiculopathy, cervical region: Secondary | ICD-10-CM | POA: Diagnosis not present

## 2014-12-28 MED ORDER — BUPIVACAINE HCL (PF) 0.25 % IJ SOLN
INTRAMUSCULAR | Status: AC
  Administered 2014-12-28: 16:00:00
  Filled 2014-12-28: qty 30

## 2014-12-28 MED ORDER — TRIAMCINOLONE ACETONIDE 40 MG/ML IJ SUSP
INTRAMUSCULAR | Status: AC
Start: 2014-12-28 — End: 2014-12-28
  Administered 2014-12-28: 16:00:00
  Filled 2014-12-28: qty 1

## 2014-12-28 MED ORDER — TRIAMCINOLONE ACETONIDE 40 MG/ML IJ SUSP
INTRAMUSCULAR | Status: AC
  Administered 2014-12-28: 16:00:00
  Filled 2014-12-28: qty 1

## 2014-12-28 MED ORDER — MIDAZOLAM HCL 5 MG/5ML IJ SOLN
INTRAMUSCULAR | Status: AC
  Filled 2014-12-28: qty 5

## 2014-12-28 MED ORDER — OXYCODONE HCL 15 MG PO TABS: 15.0000 mg | ORAL_TABLET | Freq: Two times a day (BID) | ORAL | Status: DC

## 2014-12-28 NOTE — Patient Instructions (Signed)
Epidural Steroid Injection Patient Information  Description: The epidural space surrounds the nerves as they exit the spinal cord.  In some patients, the nerves can be compressed and inflamed by a bulging disc or a tight spinal canal (spinal stenosis).  By injecting steroids into the epidural space, we can bring irritated nerves into direct contact with a potentially helpful medication.  These steroids act directly on the irritated nerves and can reduce swelling and inflammation which often leads to decreased pain.  Epidural steroids may be injected anywhere along the spine and from the neck to the low back depending upon the location of your pain.   After numbing the skin with local anesthetic (like Novocaine), a small needle is passed into the epidural space slowly.  You may experience a sensation of pressure while this is being done.  The entire block usually last less than 10 minutes.  Conditions which may be treated by epidural steroids:   Low back and leg pain  Neck and arm pain  Spinal stenosis  Post-laminectomy syndrome  Herpes zoster (shingles) pain  Pain from compression fractures  Preparation for the injection:  1. Do not eat any solid food or dairy products within 6 hours of your appointment.  2. You may drink clear liquids up to 2 hours before appointment.  Clear liquids include water, black coffee, juice or soda.  No milk or cream please. 3. You may take your regular medication, including pain medications, with a sip of water before your appointment  Diabetics should hold regular insulin (if taken separately) and take 1/2 normal NPH dos the morning of the procedure.  Carry some sugar containing items with you to your appointment. 4. A driver must accompany you and be prepared to drive you home after your procedure.  5. Bring all your current medications with your. 6. An IV may be inserted and sedation may be given at the discretion of the physician.   7. A blood pressure  cuff, EKG and other monitors will often be applied during the procedure.  Some patients may need to have extra oxygen administered for a short period. 8. You will be asked to provide medical information, including your allergies, prior to the procedure.  We must know immediately if you are taking blood thinners (like Coumadin/Warfarin)  Or if you are allergic to IV iodine contrast (dye). We must know if you could possible be pregnant.  Possible side-effects:  Bleeding from needle site  Infection (rare, may require surgery)  Nerve injury (rare)  Numbness & tingling (temporary)  Difficulty urinating (rare, temporary)  Spinal headache ( a headache worse with upright posture)  Light -headedness (temporary)  Pain at injection site (several days)  Decreased blood pressure (temporary)  Weakness in arm/leg (temporary)  Pressure sensation in back/neck (temporary)  Call if you experience:  Fever/chills associated with headache or increased back/neck pain.  Headache worsened by an upright position.  New onset weakness or numbness of an extremity below the injection site  Hives or difficulty breathing (go to the emergency room)  Inflammation or drainage at the infection site  Severe back/neck pain  Any new symptoms which are concerning to you  Please note:  Although the local anesthetic injected can often make your back or neck feel good for several hours after the injection, the pain will likely return.  It takes 3-7 days for steroids to work in the epidural space.  You may not notice any pain relief for at least that one week.    If effective, we will often do a series of three injections spaced 3-6 weeks apart to maximally decrease your pain.  After the initial series, we generally will wait several months before considering a repeat injection of the same type.  If you have any questions, please call [phone] [facility]  Discharge Instructions  General Discharge Instructions :  If you need to reach your doctor call: Monday-Friday 8:00 am - 4:00 pm at [phone] or toll free [phone].  After clinic hours [phone] to have operator reach doctor.  Bring all of your medication bottles to all your appointments in the pain clinic.  To cancel or reschedule your appointment with Pain Management please remember to call 24 hours in advance to avoid a fee.  Refer to the educational materials which you have been given on: General Risks, I had my Procedure. Discharge Instructions, Post Sedation.  Post Procedure Instructions:  The drugs you were given will stay in your system until tomorrow, so for the next 24 hours you should not drive, make any legal decisions or drink any alcoholic beverages.  You may eat anything you prefer, but it is better to start with liquids then soups and crackers, and gradually work up to solid foods.  Please notify your doctor immediately if you have any unusual bleeding, trouble breathing or pain that is not related to your normal pain.  Depending on the type of procedure that was done, some parts of your body may feel week and/or numb.  This usually clears up by tonight or the next day.  Walk with the use of an assistive device or accompanied by an adult for the 24 hours.  You may use ice on the affected area for the first 24 hours.  Put ice in a Ziploc bag and cover with a towel and place against area 15 minutes on 15 minutes off.  You may switch to heat after 24 hours. 

## 2014-12-28 NOTE — Progress Notes (Signed)
   Subjective:    Patient ID: Deborah Blackwell, female    DOB: 1960-07-11, 54 y.o.   MRN: [id number]  HPI    Review of Systems     Objective:   Physical Exam        Assessment & Plan:  Patient was given oxycodone code on 15 mg 1 tablet twice a day for pain on a when necessary basis and that she was given 60 tablets

## 2014-12-28 NOTE — Progress Notes (Signed)
Safety precautions to be maintained throughout the outpatient stay will include: orient to surroundings, keep bed in low position, maintain call bell within reach at all times, provide assistance with transfer out of bed and ambulation.  

## 2014-12-28 NOTE — Procedures (Signed)
Date of procedure: 12/28/2014  Preoperative Diagnosis:  1 chronic cervical pain 2 cervical degenerative disc disease  Postoperative Diagnosis:  Same.  Procedure: 1. Midline Translaminar cervical epidural steroid injection at C7/T1 2. Epidurogram with interpretation. 3. Fluoroscopic guidance.   Surgeon: Lance Bosch, MD  Anesthesia: MAC anesthesia by the nurse and staff under my direction  Informed consent was obtained and the patient appeared to accept and understand the benefits and risks of this procedure.   Pre procedure comments:  Patient is allergic to contrast media so no contrast was used The needle positioning was verified by AP and lateral fluoroscopic viewing  Description of the Procedure:  The patient was taken to the operating room and placed in the prone position.  Intravenous sedation and MAC anesthesia was administered by the nurse and staff under my direction.   After appropriate sedation the cervicothoracic area was prepped with Betadine.  After adequate draping, the C7-T1 interspace was visualized from AP fluoroscopic view.  The skin over the C7-T1 interspace was infiltrated with 3 cc of 1% Lidocine using a 25 gauge needle.  Then a #17 gauge Tuaohy needle was inserted, almost perpendicularly and just superior to the spinous precess of C7-T1.  The needle was advanced into the cervical epidural space using a loss of resistance technique.  Multiple fluoroscopic vies in the AP view were done to obtain needle direction and multiple lateral views were used to determine the depth of the needle from the epidural space.  After accessing the epidural space, aspirations were negative for blood, CSF and other body fluids.  Epidurogram with interpretation: No contrast was used since the patient is allergic to contrast media First no epidurogram was performed  Cervical Translaminar Epidural Steroid Injection: Then 80 mg of Celestone and 3 cc of 0.25% bupivacaine were  injected into the epidural space through the needle.  The needle was removed.  Adequate hemostasis was established.  This procedure was done under fluoroscopic control Fluoroscopy time was 0.7 minutes MG Y was 3.27 There were 2 fluoroscopic views  The patient tolerated the procedure quite well and vital signs were stable.  There were no adverse effects.  Additional Comments:  The patient was taken to the recovery room in satisfactory condition where where the patient was observed and subsequently discharged. Will follow up in the clinic in one week.    Lance Bosch M.D.

## 2014-12-29 ENCOUNTER — Telehealth: Payer: Self-pay | Admitting: *Deleted

## 2014-12-29 NOTE — Telephone Encounter (Signed)
Patient called back from message left today re; procedure on yesterday to report that she is having a headache that started last evevning around 1900.  She is s/p procedure @ 1630 on yesterday.  When questioned she has been eating and drinking and mostly laying around today d/t the headache.  Patient denies fever or bleeding from site.  She reports that she is using Tylenol ES along with prescribed medications and feels as if the headache has eased off.  Pain rating last night was 6-7 and currently rating is 3-4.  Encouraged Pam to get into a lying flat position, continue to hydrate and see if the pain gets better.  If pain does not get better over night, Pam will call our office back in the morning for further instructions.  Told Pam that if pain became severe between now and then she should report the ED.  Discussed position changes and increase pain when standing versus decreased pain when lying flat.  Patient verbalizes u/o information given.

## 2014-12-29 NOTE — Telephone Encounter (Signed)
Left voicemail to call our office if she has any questions or concerns re; procedure on yesterday. 

## 2014-12-30 ENCOUNTER — Ambulatory Visit (INDEPENDENT_AMBULATORY_CARE_PROVIDER_SITE_OTHER): Payer: Medicare Other | Admitting: Psychiatry

## 2014-12-30 ENCOUNTER — Encounter: Payer: Self-pay | Admitting: Psychiatry

## 2014-12-30 VITALS — BP 118/84 | HR 97 | Temp 98.0°F | Ht 61.0 in | Wt 154.4 lb

## 2014-12-30 DIAGNOSIS — F332 Major depressive disorder, recurrent severe without psychotic features: Secondary | ICD-10-CM | POA: Diagnosis not present

## 2014-12-30 MED ORDER — ALPRAZOLAM 1 MG PO TABS: 1.0000 mg | ORAL_TABLET | Freq: Four times a day (QID) | ORAL | Status: DC

## 2014-12-30 MED ORDER — ARIPIPRAZOLE 10 MG PO TABS: 10.0000 mg | ORAL_TABLET | Freq: Every day | ORAL | Status: DC

## 2014-12-30 MED ORDER — FLUOXETINE HCL 40 MG PO CAPS: 40.0000 mg | ORAL_CAPSULE | Freq: Every day | ORAL | Status: DC

## 2014-12-30 NOTE — Progress Notes (Signed)
[place] MD/PA/NP OP Progress Note  12/30/2014 10:27 AM Deborah Blackwell  MRN:  [id number]  Subjective:  Patient returns for follow-up of her major depressive disorder, severe without psychotic features.and generalized anxiety. She states that her biggest issues is struggling with pain. She states she continues to get injections but continues to battle with pain. She's able to state mentally she's doing well and her medications have been keeping her mood and anxiety under control. She states that she is thankful that her husband is living in another year given he's had significant heart problems. She states that the holidays are difficult for her because they do not have any children and her siblings have now spread out and really are not getting together since the death of their parents. She states they do have some friends that they get together with them do secret [place]. Chief Complaint: Good Chief Complaint    Follow-up; Medication Refill     Visit Diagnosis:     ICD-9-CM ICD-10-CM   1. Major depressive disorder, recurrent, severe without psychotic features ([place]) 296.33 F33.2     Past Medical History:  Past Medical History  Diagnosis Date  . History of scoliosis     was casted as a child  . Angina at [facility])   . Arthritis   . Asthma   . GERD (gastroesophageal reflux disease)   . COPD (chronic obstructive pulmonary disease) ([place])   . Diabetes mellitus without complication ([place])   . Hypertension   . Hyperlipidemia   . Gout   . RA (rheumatoid arthritis) ([place])   . History of kidney stones   . Migraines   . Depression   . Panic attack   . Bipolar disorder ([place])   . Congenital scoliosis   . DDD (degenerative disc disease), lumbar     Past Surgical History  Procedure Laterality Date  . Abdominal hysterectomy  2003    one ovary remains: due to heavy bleeding/endometrioma/cysts  . Carpal tunnel release    . Back surgery      and injections  . Foot surgery Right     bone spur  .  Polypectomy      uterine   Family History:  Family History  Problem Relation Age of Onset  . Alzheimer's disease Mother   . Dementia Mother   . Cancer Mother     colon  . Heart disease Mother   . Hypertension Mother   . Depression Mother   . Alzheimer's disease Father   . Dementia Father   . Hypertension Father   . Cancer Brother     lung  . Lung disease Brother   . Hypertension Sister    Social History:  Social History   Social History  . Marital Status: Married    Spouse Name: N/A  . Number of Children: N/A  . Years of Education: N/A   Social History Main Topics  . Smoking status: Former Smoker    Types: Cigarettes    Quit date: 02/02/1991  . Smokeless tobacco: Never Used  . Alcohol Use: No  . Drug Use: No  . Sexual Activity: No   Other Topics Concern  . None   Social History Narrative   Additional History:   Assessment:   Musculoskeletal: Strength & Muscle Tone: within normal limits Gait & Station: Walks slowly with a limp Patient leans: N/A  Psychiatric Specialty Exam: HPI  Review of Systems  Musculoskeletal: Positive for joint pain (in pain management).  Psychiatric/Behavioral: Negative  for depression, suicidal ideas, hallucinations, memory loss and substance abuse. The patient is not nervous/anxious and does not have insomnia.   All other systems reviewed and are negative.   Blood pressure 118/84, pulse 97, temperature 98 F (36.7 C), temperature source Tympanic, height 5\' 1"  (1.549 m), weight 154 lb 6.4 oz (70.035 kg), SpO2 92 %.Body mass index is 29.19 kg/(m^2).  General Appearance: Well Groomed  Eye Contact:  Good  Speech:  Normal Rate  Volume:  Normal  Mood:  Good  Affect:  , smiling  Thought Process:  Linear and Logical  Orientation:  Full (Time, Place, and Person)  Thought Content:  Negative  Suicidal Thoughts:  No  Homicidal Thoughts:  No  Memory:  Immediate;   Good Recent;   Good Remote;   Good  Judgement:  Good  Insight:   Good  Psychomotor Activity:  Negative  Concentration:  Good  Recall:  Good  Fund of Knowledge: Good  Language: Good  Akathisia:  Negative  Handed:  Right  AIMS (if indicated):  Done at last visit and was normal   Assets:  Communication Skills Desire for Improvement Social Support  ADL's:  Intact  Cognition: WNL  Sleep:  fair   Is the patient at risk to self?  No. Has the patient been a risk to self in the past 6 months?  No. Has the patient been a risk to self within the distant past?  Yes.   Is the patient a risk to others?  No. Has the patient been a risk to others in the past 6 months?  No. Has the patient been a risk to others within the distant past?  No.  Current Medications: Current Outpatient Prescriptions  Medication Sig Dispense Refill  . albuterol (VENTOLIN HFA) 108 (90 BASE) MCG/ACT inhaler Inhale 2 puffs into the lungs every 6 (six) hours as needed for wheezing or shortness of breath.    . ALPRAZolam (XANAX) 1 MG tablet Take 1 tablet (1 mg total) by mouth 4 (four) times daily. 120 tablet 4  . amLODipine (NORVASC) 10 MG tablet Take 10 mg by mouth daily.    . ARIPiprazole (ABILIFY) 10 MG tablet Take 1 tablet (10 mg total) by mouth daily. 30 tablet 4  . atenolol (TENORMIN) 50 MG tablet Take 50 mg by mouth 2 (two) times daily.    Marland Kitchen atorvastatin (LIPITOR) 40 MG tablet Take 40 mg by mouth at bedtime.    . budesonide-formoterol (SYMBICORT) 160-4.5 MCG/ACT inhaler Inhale 2 puffs into the lungs 2 (two) times daily.    Marland Kitchen FLUoxetine (PROZAC) 40 MG capsule Take 1 capsule (40 mg total) by mouth daily. 30 capsule 4  . furosemide (LASIX) 40 MG tablet Take 40 mg by mouth daily.    Marland Kitchen levothyroxine (SYNTHROID, LEVOTHROID) 75 MCG tablet Take 1 tablet (75 mcg total) by mouth daily before breakfast. 30 tablet 1  . LOSARTAN POTASSIUM PO Take 100 mg by mouth.    Marland Kitchen omeprazole (PRILOSEC) 20 MG capsule Take 20 mg by mouth 2 (two) times daily before a meal.    . oxyCODONE (ROXICODONE) 15 MG  immediate release tablet Take 1 tablet (15 mg total) by mouth 2 (two) times daily after a meal. 60 tablet 0  . saxagliptin HCl (ONGLYZA) 5 MG TABS tablet Take 5 mg by mouth daily.     Current Facility-Administered Medications  Medication Dose Route Frequency Provider Last Rate Last Dose  . bupivacaine (PF) (MARCAINE) 0.25 % injection 30 mL  30  mL Infiltration Once Lance Bosch, MD      . fentaNYL (SUBLIMAZE) injection 100 mcg  100 mcg Intravenous Once Lance Bosch, MD      . lactated ringers infusion 1,000 mL  1,000 mL Intravenous Continuous Lance Bosch, MD      . triamcinolone acetonide (KENALOG-40) injection (RADIOLOGY ONLY) 80 mg  80 mg Epidural Once Lance Bosch, MD        Medical Decision Making:  Established Problem, Stable/Improving (1) and Review of Medication Regimen & Side Effects (2)  Treatment Plan Summary:Medication management and Plan patient has been stable on her regimen without any side effects.   Major depressive disorder, recurrent severe without psychosis. We will continue her Prozac 40 mg a day, Abilify 10 mg daily.   Generalized anxieties order-continue her alprazolam 1 mg 4 times a day. Patient stable on these regimens.   She'll follow-up in 3 months. I have explained my departure from the practice in February 2017. I made patient aware though be another doctor in the practice to continue her care. She's been encouraged call any questions or concerns prior to her next point.   Faith Rogue 12/30/2014, 10:27 AM

## 2015-01-27 ENCOUNTER — Encounter: Payer: Self-pay | Admitting: Anesthesiology

## 2015-01-27 ENCOUNTER — Other Ambulatory Visit: Payer: Self-pay | Admitting: Anesthesiology

## 2015-01-27 ENCOUNTER — Ambulatory Visit: Payer: Medicare Other | Attending: Anesthesiology | Admitting: Anesthesiology

## 2015-01-27 VITALS — BP 138/77 | HR 113 | Temp 97.7°F | Resp 18 | Ht 62.0 in | Wt 142.0 lb

## 2015-01-27 DIAGNOSIS — M542 Cervicalgia: Secondary | ICD-10-CM | POA: Diagnosis not present

## 2015-01-27 DIAGNOSIS — M5030 Other cervical disc degeneration, unspecified cervical region: Secondary | ICD-10-CM

## 2015-01-27 DIAGNOSIS — G8929 Other chronic pain: Secondary | ICD-10-CM | POA: Insufficient documentation

## 2015-01-27 DIAGNOSIS — M5412 Radiculopathy, cervical region: Secondary | ICD-10-CM | POA: Diagnosis not present

## 2015-01-27 DIAGNOSIS — Z79899 Other long term (current) drug therapy: Secondary | ICD-10-CM | POA: Diagnosis not present

## 2015-01-27 DIAGNOSIS — M545 Low back pain: Secondary | ICD-10-CM | POA: Insufficient documentation

## 2015-01-27 DIAGNOSIS — F329 Major depressive disorder, single episode, unspecified: Secondary | ICD-10-CM | POA: Diagnosis not present

## 2015-01-27 DIAGNOSIS — Z79891 Long term (current) use of opiate analgesic: Secondary | ICD-10-CM | POA: Diagnosis not present

## 2015-01-27 DIAGNOSIS — M5010 Cervical disc disorder with radiculopathy, unspecified cervical region: Secondary | ICD-10-CM | POA: Insufficient documentation

## 2015-01-27 DIAGNOSIS — Z5181 Encounter for therapeutic drug level monitoring: Secondary | ICD-10-CM | POA: Diagnosis not present

## 2015-01-27 MED ORDER — OXYCODONE HCL 15 MG PO TABS: 15.0000 mg | ORAL_TABLET | Freq: Two times a day (BID) | ORAL | Status: DC

## 2015-01-27 NOTE — Patient Instructions (Signed)
You were given a prescription for Oxycodone today. 

## 2015-01-27 NOTE — Progress Notes (Signed)
Safety precautions to be maintained throughout the outpatient stay will include: orient to surroundings, keep bed in low position, maintain call bell within reach at all times, provide assistance with transfer out of bed and ambulation.  

## 2015-01-27 NOTE — Progress Notes (Signed)
   Subjective:    Patient ID: Deborah Blackwell, female    DOB: [birth year], 55 y.o.   MRN: [id number]  HPI  Patient returns to the clinic today indicating that she did not get any relief from the cervical translaminar epidural steroid injection She is under the care of Dr. Faith Rogue for long-standing chronic depression. Since she has not responded positively to any of the interventions I performed for her and given her chronic depressive state, I will withhold all interventions at this time I would continue with the oxycodone for her but I weren't controlled her dose to no more than twice a day It appears that there are some psychogenic dynamics taken place which might make it hard to achieve satisfactory analgesia with those procedures. She otherwise looks fine She is moderately tremulous and that could be secondary to some of the medications that she is taking Her activities of daily living are within acceptable limits  Review of Systems  Constitutional: Negative.   HENT: Negative.   Eyes: Negative.   Respiratory: Negative.   Cardiovascular: Negative.   Gastrointestinal: Negative.   Endocrine: Negative.   Genitourinary: Negative.   Musculoskeletal: Negative.   Allergic/Immunologic: Negative.   Neurological: Negative.   Hematological: Negative.   Psychiatric/Behavioral:       This patient has a chronic depressive illness and is under the care of Dr. Jimmye Norman       Objective:   Physical Exam  Cardiovascular:  Patient appears to be in no particular distress She is somewhat tremulous at the present time Her blood pressure is 138/77 mmHg Pulse is 130 bpm Equal and regular Heart sounds 1 and 2 were heard in all areas There were no audible murmurs Temperature is 97.52F Respirations are 18 breaths per minute  SPO2 was 98%  Chest is clinically clear There are no adventitious sounds  Abdomen is soft and nontender There is no palpable organomegaly There Is no significant  lymphadenopathy Pupils are equal and reactive Cranial nerves are intact There are no new neurological or musculoskeletal findings   Nursing note and vitals reviewed.         Assessment & Plan:    Assessment 1 chronic cervical pain 2 cervical degenerative disc disease 3 cervical radiculopathy 4 chronic low back pain 5 Chronic depressive illness   Plan of management We will withhold all interventions at this time We will continue with her medications and give her oxycodone 15 mg every 12 hours when necessary and give [place] 60 tablets We'll follow-up with her in 1 month    Established patient     level III   Lance Bosch M.D.

## 2015-02-05 LAB — TOXASSURE SELECT 13 (MW), URINE: PDF: 0

## 2015-02-25 ENCOUNTER — Ambulatory Visit: Payer: Medicare Other | Attending: Anesthesiology | Admitting: Anesthesiology

## 2015-02-25 ENCOUNTER — Encounter: Payer: Self-pay | Admitting: Anesthesiology

## 2015-02-25 VITALS — BP 118/80 | HR 114 | Temp 98.6°F | Resp 16 | Ht 62.0 in | Wt 142.0 lb

## 2015-02-25 DIAGNOSIS — M5416 Radiculopathy, lumbar region: Secondary | ICD-10-CM

## 2015-02-25 DIAGNOSIS — M5030 Other cervical disc degeneration, unspecified cervical region: Secondary | ICD-10-CM | POA: Diagnosis not present

## 2015-02-25 DIAGNOSIS — M542 Cervicalgia: Secondary | ICD-10-CM | POA: Diagnosis not present

## 2015-02-25 DIAGNOSIS — M5431 Sciatica, right side: Secondary | ICD-10-CM

## 2015-02-25 DIAGNOSIS — M1288 Other specific arthropathies, not elsewhere classified, other specified site: Secondary | ICD-10-CM | POA: Insufficient documentation

## 2015-02-25 DIAGNOSIS — M545 Low back pain: Secondary | ICD-10-CM | POA: Diagnosis not present

## 2015-02-25 DIAGNOSIS — G8929 Other chronic pain: Secondary | ICD-10-CM | POA: Insufficient documentation

## 2015-02-25 DIAGNOSIS — F329 Major depressive disorder, single episode, unspecified: Secondary | ICD-10-CM | POA: Diagnosis not present

## 2015-02-25 DIAGNOSIS — F32A Depression, unspecified: Secondary | ICD-10-CM

## 2015-02-25 DIAGNOSIS — M5137 Other intervertebral disc degeneration, lumbosacral region: Secondary | ICD-10-CM

## 2015-02-25 DIAGNOSIS — M5441 Lumbago with sciatica, right side: Secondary | ICD-10-CM | POA: Diagnosis not present

## 2015-02-25 DIAGNOSIS — M5116 Intervertebral disc disorders with radiculopathy, lumbar region: Secondary | ICD-10-CM | POA: Diagnosis not present

## 2015-02-25 MED ORDER — OXYCODONE HCL 15 MG PO TABS: 15.0000 mg | ORAL_TABLET | Freq: Two times a day (BID) | ORAL | Status: DC

## 2015-02-25 NOTE — Progress Notes (Signed)
   Subjective:    Patient ID: Deborah Blackwell, female    DOB: 01-20-1960, 55 y.o.   MRN: [id number]  HPI  This patient returned to the clinic today indicating that she is doing reasonably well She indicates that the medication is keeping her well controlled and that when her pain is well controlled her depression is also better She expressed regret that Dr. Jimmye Norman who manages her depression will be leaving and she does not know who his replacement is going to be. She was very satisfied with the care provided to her by Dr. Jimmye Norman She indicated to me that she had to take more than 2 of her Roxicodone tablets a day and as a result she was out of medication 2-3 days before her visit with me I spent some time discussing the need to stay on the regimen that was prescribed and she appeared to accept and understand my recommendation  Review of Systems  Constitutional: Negative.   HENT: Negative.   Eyes: Negative.   Respiratory: Negative.   Cardiovascular: Negative.   Gastrointestinal: Negative.   Endocrine: Negative.   Musculoskeletal: Negative.   Skin: Negative.   Allergic/Immunologic: Negative.   Neurological: Negative.   Hematological: Negative.   Psychiatric/Behavioral:       Patient has a diagnosis of major depression and is under the care of Dr. Faith Rogue Her depression appears to be well managed and the patient indicates that when her pain is well managed her depression is also well managed too.       Objective:   Physical Exam  Cardiovascular:  Patient appears to be in excellent spirits Subjective pain intensity rating is 55% Her blood pressure is 118/80 mmHg Her pulse is 114 bpm Heart sounds 1 and 2 were heard in all areas  There were no audible murmurs Respirations are 16 breaths per minute SPO2 was 97% Chest is clinically clear There are no adventitious sounds Limited is soft and nontender There is no palpable organomegaly There is no significant  lymphadenopathy Pupils are equal and reactive Cranial nerves are intact There are no new neurological or musculoskeletal findings   Nursing note and vitals reviewed.         Assessment & Plan:   Assessment 1 chronic low back pain 2 lumbar degenerative disc disease 3 lumbar radiculopathy 4 lumbar facet arthropathy 5 chronic cervical pain 6 major depressive illness    Plan of management 1 for oxycodone 50 mg every 12 hours when necessary and will give her 60 pills  2 will reevaluate her in one month 3 to continue with her psychiatric care   Established patient       level III   Lance Bosch M.D.

## 2015-02-26 ENCOUNTER — Ambulatory Visit (INDEPENDENT_AMBULATORY_CARE_PROVIDER_SITE_OTHER): Payer: Medicare Other | Admitting: Family Medicine

## 2015-02-26 ENCOUNTER — Telehealth: Payer: Self-pay | Admitting: Family Medicine

## 2015-02-26 ENCOUNTER — Encounter: Payer: Self-pay | Admitting: Family Medicine

## 2015-02-26 VITALS — BP 130/85 | HR 97 | Temp 98.2°F | Wt 157.0 lb

## 2015-02-26 DIAGNOSIS — R350 Frequency of micturition: Secondary | ICD-10-CM | POA: Diagnosis not present

## 2015-02-26 DIAGNOSIS — I10 Essential (primary) hypertension: Secondary | ICD-10-CM

## 2015-02-26 DIAGNOSIS — E1140 Type 2 diabetes mellitus with diabetic neuropathy, unspecified: Secondary | ICD-10-CM | POA: Diagnosis not present

## 2015-02-26 DIAGNOSIS — J449 Chronic obstructive pulmonary disease, unspecified: Secondary | ICD-10-CM

## 2015-02-26 DIAGNOSIS — E785 Hyperlipidemia, unspecified: Secondary | ICD-10-CM

## 2015-02-26 DIAGNOSIS — M5030 Other cervical disc degeneration, unspecified cervical region: Secondary | ICD-10-CM | POA: Diagnosis not present

## 2015-02-26 DIAGNOSIS — Z5181 Encounter for therapeutic drug level monitoring: Secondary | ICD-10-CM | POA: Diagnosis not present

## 2015-02-26 DIAGNOSIS — M109 Gout, unspecified: Secondary | ICD-10-CM | POA: Diagnosis not present

## 2015-02-26 DIAGNOSIS — M5137 Other intervertebral disc degeneration, lumbosacral region: Secondary | ICD-10-CM | POA: Diagnosis not present

## 2015-02-26 DIAGNOSIS — E039 Hypothyroidism, unspecified: Secondary | ICD-10-CM

## 2015-02-26 MED ORDER — ATENOLOL 50 MG PO TABS: 50.0000 mg | ORAL_TABLET | Freq: Two times a day (BID) | ORAL | Status: DC

## 2015-02-26 MED ORDER — LEVOTHYROXINE SODIUM 75 MCG PO TABS: 75.0000 ug | ORAL_TABLET | Freq: Every day | ORAL | Status: DC

## 2015-02-26 MED ORDER — LOSARTAN POTASSIUM 100 MG PO TABS: 100.0000 mg | ORAL_TABLET | Freq: Every day | ORAL | Status: DC

## 2015-02-26 MED ORDER — NITROFURANTOIN MONOHYD MACRO 100 MG PO CAPS: 100.0000 mg | ORAL_CAPSULE | Freq: Two times a day (BID) | ORAL | Status: DC

## 2015-02-26 MED ORDER — NYSTATIN 100000 UNIT/GM EX POWD: CUTANEOUS | Status: DC

## 2015-02-26 MED ORDER — HUGO ROLLING WALKER PREMIUM MISC: Status: DC

## 2015-02-26 NOTE — Assessment & Plan Note (Signed)
Well-contorlled; limit salt; try to follow DASH guidelines

## 2015-02-26 NOTE — Patient Instructions (Addendum)
Please ask [facility] to send me a diabetic shoe order (it is not in my computer to send to them) We'll get labs today We'll refer you for an eye exam If you have not heard anything from my staff in a week about any orders/referrals/studies from today, please contact us here to follow-up [phone]  Keep up the good work with trying to eat right Stay off of the [place] until we get your lab results  Return to see me for your next follow-up appointment at [facility] in 3 months  Cholesterol Cholesterol is a fat. Your body needs a small amount of cholesterol. Cholesterol may build up in your blood vessels. This increases your chance of having a heart attack or stroke. You cannot feel your cholesterol levels. The only way to know your cholesterol level is high is with a blood test. Keep your test results. Work with your doctor to keep your cholesterol at a good level. WHAT DO THE TEST RESULTS MEAN?  Total cholesterol is how much cholesterol is in your blood.  LDL is bad cholesterol. This is the type that can build up. You want LDL to be low.  HDL is good cholesterol. It cleans your blood vessels and carries LDL away. You want HDL to be high.  Triglycerides are fat that the body can burn for energy or store. WHAT ARE GOOD LEVELS OF CHOLESTEROL?  Total cholesterol below 200.  LDL below 100 for people at risk. Below 70 for those at very high risk.  HDL above 50 is good. Above 60 is best.  Triglycerides below 150. HOW CAN I LOWER MY CHOLESTEROL?  Diet. Follow your diet programs as told by your doctor.  Choose fish, white meat chicken, roasted [place], or baked [place]. Try not to eat red meat, fried foods, or processed meats such as sausage and lunch meats.  Eat lots of fresh fruits and vegetables.  Choose whole grains, beans, pasta, potatoes, and cereals.  Use only small amounts of olive, corn, or canola oils.  Try not to eat butter, mayonnaise, shortening, or palm kernel  oils.  Try not to eat foods with trans fats.  Drink skim or nonfat milk. Eat low-fat or nonfat yogurt and cheeses. Try not to drink whole milk or cream. Try not to eat ice cream, egg yolks, and full-fat cheeses.  Healthy desserts include angel food cake, ginger snaps, animal crackers, hard candy, popsicles, and low-fat or nonfat frozen yogurt. Try not to eat pastries, cakes, pies, and cookies.  Exercise. Follow your exercise programs as told by your doctor.  Be more active. You can try gardening, walking, or taking the stairs. Ask your doctor about how you can be more active.  Medicine. Take medicine as told by your doctor.   This information is not intended to replace advice given to you by your health care provider. Make sure you discuss any questions you have with your health care provider.   Document Released: 03/31/2008 Document Revised: 01/23/2014 Document Reviewed: 10/16/2012 Elsevier Interactive Patient Education 2016 [place] DASH stands for "Dietary Approaches to Stop Hypertension." The DASH eating plan is a healthy eating plan that has been shown to reduce high blood pressure (hypertension). Additional health benefits may include reducing the risk of type 2 diabetes mellitus, heart disease, and stroke. The DASH eating plan may also help with weight loss. WHAT DO I NEED TO KNOW ABOUT THE DASH EATING PLAN? For the DASH eating plan, you will follow these general guidelines:  Choose foods with a percent daily value for sodium of less than 5% (as listed on the food label).  Use salt-free seasonings or herbs instead of table salt or sea salt.  Check with your health care provider or pharmacist before using salt substitutes.  Eat lower-sodium products, often labeled as "lower sodium" or "no salt added."  Eat fresh foods.  Eat more vegetables, fruits, and low-fat dairy products.  Choose whole grains. Look for the word "whole" as the first word in the  ingredient list.  Choose fish and skinless chicken or [place] more often than red meat. Limit fish, poultry, and meat to 6 oz (170 g) each day.  Limit sweets, desserts, sugars, and sugary drinks.  Choose heart-healthy fats.  Limit cheese to 1 oz (28 g) per day.  Eat more home-cooked food and less restaurant, buffet, and fast food.  Limit fried foods.  Cook foods using methods other than frying.  Limit canned vegetables. If you do use them, rinse them well to decrease the sodium.  When eating at a restaurant, ask that your food be prepared with less salt, or no salt if possible. WHAT FOODS CAN I EAT? Seek help from a dietitian for individual calorie needs. Grains Whole grain or whole wheat bread. Brown rice. Whole grain or whole wheat pasta. Quinoa, bulgur, and whole grain cereals. Low-sodium cereals. Corn or whole wheat flour tortillas. Whole grain cornbread. Whole grain crackers. Low-sodium crackers. Vegetables Fresh or frozen vegetables (raw, steamed, roasted, or grilled). Low-sodium or reduced-sodium tomato and vegetable juices. Low-sodium or reduced-sodium tomato sauce and paste. Low-sodium or reduced-sodium canned vegetables.  Fruits All fresh, canned (in natural juice), or frozen fruits. Meat and Other Protein Products Ground beef (85% or leaner), grass-fed beef, or beef trimmed of fat. Skinless chicken or [place]. Ground chicken or [place]. Pork trimmed of fat. All fish and seafood. Eggs. Dried beans, peas, or lentils. Unsalted nuts and seeds. Unsalted canned beans. Dairy Low-fat dairy products, such as skim or 1% milk, 2% or reduced-fat cheeses, low-fat ricotta or cottage cheese, or plain low-fat yogurt. Low-sodium or reduced-sodium cheeses. Fats and Oils Tub margarines without trans fats. Light or reduced-fat mayonnaise and salad dressings (reduced sodium). Avocado. Safflower, olive, or canola oils. Natural peanut or almond butter. Other Unsalted popcorn and pretzels. The  items listed above may not be a complete list of recommended foods or beverages. Contact your dietitian for more options. WHAT FOODS ARE NOT RECOMMENDED? Grains White bread. White pasta. White rice. Refined cornbread. Bagels and croissants. Crackers that contain trans fat. Vegetables Creamed or fried vegetables. Vegetables in a cheese sauce. Regular canned vegetables. Regular canned tomato sauce and paste. Regular tomato and vegetable juices. Fruits Dried fruits. Canned fruit in light or heavy syrup. Fruit juice. Meat and Other Protein Products Fatty cuts of meat. Ribs, chicken wings, bacon, sausage, bologna, salami, chitterlings, fatback, hot dogs, bratwurst, and packaged luncheon meats. Salted nuts and seeds. Canned beans with salt. Dairy Whole or 2% milk, cream, half-and-half, and cream cheese. Whole-fat or sweetened yogurt. Full-fat cheeses or blue cheese. Nondairy creamers and whipped toppings. Processed cheese, cheese spreads, or cheese curds. Condiments Onion and garlic salt, seasoned salt, table salt, and sea salt. Canned and packaged gravies. Worcestershire sauce. Tartar sauce. Barbecue sauce. Teriyaki sauce. Soy sauce, including reduced sodium. Steak sauce. Fish sauce. Oyster sauce. Cocktail sauce. Horseradish. Ketchup and mustard. Meat flavorings and tenderizers. Bouillon cubes. Hot sauce. Tabasco sauce. Marinades. Taco seasonings. Relishes. Fats and Oils Butter, stick margarine, lard, shortening,  ghee, and bacon fat. Coconut, palm kernel, or palm oils. Regular salad dressings. Other Pickles and olives. Salted popcorn and pretzels. The items listed above may not be a complete list of foods and beverages to avoid. Contact your dietitian for more information. WHERE CAN I FIND MORE INFORMATION? National Heart, Lung, and Blood Institute: travelstabloid.com   This information is not intended to replace advice given to you by your health care provider. Make  sure you discuss any questions you have with your health care provider.   Document Released: 12/22/2010 Document Revised: 01/23/2014 Document Reviewed: 11/06/2012 Elsevier Interactive Patient Education [employer].

## 2015-02-26 NOTE — Assessment & Plan Note (Signed)
Check TSH today; refill meds, adjust meds if needed

## 2015-02-26 NOTE — Assessment & Plan Note (Signed)
Check urine today 

## 2015-02-26 NOTE — Assessment & Plan Note (Signed)
Controlled, using neb occasionally

## 2015-02-26 NOTE — Assessment & Plan Note (Signed)
Check lipids today; continue to watch diet

## 2015-02-26 NOTE — Assessment & Plan Note (Signed)
Check SGPT and BMP

## 2015-02-26 NOTE — Assessment & Plan Note (Signed)
Managed by Dr. Idelia Salm

## 2015-02-26 NOTE — Assessment & Plan Note (Signed)
No recent flares; check uric acid today

## 2015-02-26 NOTE — Assessment & Plan Note (Signed)
Check labs today.

## 2015-02-26 NOTE — Telephone Encounter (Signed)
Patient appears to have a bladder infection I called, reached voicemail; left msg that she does appear to have bladder infection Start antibiotic; it's almost 7 pm, not sure if [facility] is open this late, but I would like her to start tonight if possible; sending Rxs to [facility] and [facility] in [place]; don't take both rxs, just one or the other

## 2015-02-26 NOTE — Progress Notes (Signed)
BP 130/85 mmHg  Pulse 97  Temp(Src) 98.2 F (36.8 C)  Wt 157 lb (71.215 kg)  SpO2 97%   Subjective:    Patient ID: Deborah Blackwell, female    DOB: 02/14/1960, 55 y.o.   MRN: [id number]  HPI: Deborah Blackwell is a 55 y.o. female  Chief Complaint  Patient presents with  . Diabetes    follow up and labs; she'd like a rx for new diabetic shoes. She states the Onglyza is too expensive, wants to know if there is a cheaper alternative. Metformin made her sick. She will get DM eye exam schedule.  Marland Kitchen Hyperlipidemia    follow up and labs  . Hypertension    follow up and labs  . Hypothyroidism    follow up and labs  . Other    She'd like a rx for a new walker  . Urinary Frequency    she's having some frequency and pressure   She declines HIV and Hep C screening  Diabetes; type 2; checking FSBS occasionally, in the 90s; has had a few low sugars, in the 50's; she gets some hard candy or orange juice; last eye exam was 3 years ago; has had more eye watery; has had a change in her eyes; more blurred vision She has cramps and tingling in her feet, some pain in both feet; going on for 4 to 5 months, about the same; she rubs them real good; tried wearing diabetic socks; helps some  She has DDD and back pain, sees pain clinic; does have pain down the right leg; see Dr. Lance Bosch for pain control; patient requesting new rolling walker; on 15 mg of oxycodone every 12 hours; not much constipation  Hypertension; controlled today; taking her blood pressure medicine; not adding salt; not eating much red meat; her husband has had 3 heart attacks, so they try to eat better; does not cook with salt almost ever  High cholesterol; not many eggs; does eat right much cheese; not much red meat  Urinary frequent and pressure; started 1 week ago; no strong odor; no burning with urination; hx of kidney stones, but no blood in urine; has had a few low-grade fevers  Gout hx; no flares recently  COPD; uses  nebulizer 3-4 x a day; using Symbicort every day  GERD; using omeprazole just when needed; spicy foods are a trigger; no blood in stool, no abd pain  Depression; will be seeing a new provider; doing well and stable on medicines  Hx of hypothyroidism for 4 years; gaining weight, but she did not weigh yesterday (did NOT gain 15 pounds in one day)  Relevant past medical, surgical, family and social history reviewed and updated as indicated. Interim medical history since our last visit reviewed. Allergies and medications reviewed and updated. Patient can NOT take aspirin  Review of Systems Where she had her hysterectomy, her incision has broken out; red rash; little bit itchy; hot water makes it worse; has had similar in the past Per HPI unless specifically indicated above     Objective:    BP 130/85 mmHg  Pulse 97  Temp(Src) 98.2 F (36.8 C)  Wt 157 lb (71.215 kg)  SpO2 97%  Wt Readings from Last 3 Encounters:  02/26/15 157 lb (71.215 kg)  02/25/15 142 lb (64.411 kg)  01/27/15 142 lb (64.411 kg)   patient did NOT weigh at the pain clinic  Physical Exam  Constitutional: She appears well-developed and well-nourished. No distress.  Eyes:  No scleral icterus.  Neck: No JVD present.  Cardiovascular: Normal rate and regular rhythm.   Pulmonary/Chest: Effort normal and breath sounds normal.  Abdominal: Soft. She exhibits no distension.  Neurological: She is alert.  Skin: Skin is warm. No pallor.  Psychiatric: Her speech is normal.  Rather flat affect, but good eye contact with examiner    Diabetic Foot Form - Detailed   Diabetic Foot Exam - detailed  Diabetic Foot exam was performed with the following findings:  Yes 02/26/2015  1:53 PM  Visual Foot Exam completed.:  Yes  Is there a history of foot ulcer?:  No  Can the patient see the bottom of their feet?:  Yes  Are the shoes appropriate in style and fit?:  Yes  Are the toenails long?:  No  Are the toenails thick?:  No  Do you  have pain in calf while walking?:  Yes (Comment: goes to the pain center for back)  Is there a claw toe deformity?:  No  Is there foot or ankle muscle weakness?:  Yes (Comment: right)  Are the toenails ingrown?:  No    Pulse Foot Exam completed.:  Yes  Right Dorsalis Pedis:  Present Left Dorsalis Pedis:  Present  Sensory Foot Exam Completed.:  Yes  Semmes-Weinstein Monofilament Test  R Site 1-Great Toe:  Pos L Site 1-Great Toe:  Pos  R Site 4:  Pos L Site 4:  Pos  R Site 5:  Pos L Site 5:  Pos    Comments:  Diminished left foot all the way to the ankle almost to monofilament testing; diminished both feet overall relative to hand        Assessment & Plan:   Problem List Items Addressed This Visit      Cardiovascular and Mediastinum   Hypertension    Well-contorlled; limit salt; try to follow DASH guidelines      Relevant Medications   losartan (COZAAR) 100 MG tablet   atenolol (TENORMIN) 50 MG tablet     Respiratory   COPD (chronic obstructive pulmonary disease) (HCC)    Controlled, using neb occasionally        Endocrine   Hypothyroidism    Check TSH today; refill meds, adjust meds if needed      Relevant Medications   atenolol (TENORMIN) 50 MG tablet   levothyroxine (SYNTHROID, LEVOTHROID) 75 MCG tablet   Other Relevant Orders   TSH (Completed)   Type 2 diabetes, controlled, with neuropathy ([place]) - Primary    Check labs today      Relevant Medications   losartan (COZAAR) 100 MG tablet   Other Relevant Orders   Ambulatory referral to Ophthalmology   Hgb A1c w/o eAG (Completed)   Microalbumin / creatinine urine ratio (Completed)     Musculoskeletal and Integument   DDD (degenerative disc disease), lumbosacral    Managed by Dr. Idelia Salm      DDD (degenerative disc disease), cervical    Managed by Dr. Idelia Salm        Other   Gout    No recent flares; check uric acid today      Relevant Orders   Uric acid (Completed)   Hyperlipidemia    Check lipids  today; continue to watch diet      Relevant Medications   losartan (COZAAR) 100 MG tablet   atenolol (TENORMIN) 50 MG tablet   Other Relevant Orders   Lipid Panel w/o Chol/HDL Ratio (Completed)   Medication monitoring encounter  Check SGPT and BMP      Relevant Orders   Comprehensive metabolic panel (Completed)   CBC with Differential/Platelet (Completed)   Urinary frequency    Check urine today      Relevant Orders   UA/M w/rflx Culture, Routine (Completed)      Follow up plan: Return in about 3 months (around 05/26/2015) for thirty minute follow-up with fasting labs.  An after-visit summary was printed and given to the patient at [facility].  Please see the patient instructions which may contain other information and recommendations beyond what is mentioned above in the assessment and plan.  Meds ordered this encounter  Medications  . losartan (COZAAR) 100 MG tablet    Sig: Take 1 tablet (100 mg total) by mouth daily.    Dispense:  90 tablet    Refill:  1  . DISCONTD: nystatin (MYCOSTATIN/NYSTOP) 100000 UNIT/GM POWD    Sig: Apply topically to rash two to three times a day    Dispense:  30 g    Refill:  1  . atenolol (TENORMIN) 50 MG tablet    Sig: Take 1 tablet (50 mg total) by mouth 2 (two) times daily.    Dispense:  180 tablet    Refill:  1  . levothyroxine (SYNTHROID, LEVOTHROID) 75 MCG tablet    Sig: Take 1 tablet (75 mcg total) by mouth daily before breakfast.    Dispense:  5 tablet    Refill:  0    Just a few, because dose may change  . DISCONTD: Misc. Devices (HUGO ROLLING WALKER PREMIUM) MISC    Sig: Any brand, patient's choice; dx: DDD lumbar region, spinal stenosis, scoliosis    Dispense:  1 each    Refill:  0   Orders Placed This Encounter  Procedures  . Microscopic Examination  . Comprehensive metabolic panel  . Lipid Panel w/o Chol/HDL Ratio  . Hgb A1c w/o eAG  . CBC with Differential/Platelet  . TSH  . Microalbumin / creatinine urine ratio   . Uric acid  . UA/M w/rflx Culture, Routine  . Urine Culture, Routine  . Ambulatory referral to Ophthalmology

## 2015-02-27 LAB — COMPREHENSIVE METABOLIC PANEL
A/G RATIO: 1.4 (ref 1.1–2.5)
ALBUMIN: 4.2 g/dL (ref 3.5–5.5)
ALT: 24 IU/L (ref 0–32)
AST: 32 IU/L (ref 0–40)
Alkaline Phosphatase: 81 IU/L (ref 39–117)
BUN/Creatinine Ratio: 7 — ABNORMAL LOW (ref 9–23)
BUN: 5 mg/dL — ABNORMAL LOW (ref 6–24)
Bilirubin Total: 0.3 mg/dL (ref 0.0–1.2)
CALCIUM: 9.4 mg/dL (ref 8.7–10.2)
CO2: 27 mmol/L (ref 18–29)
CREATININE: 0.7 mg/dL (ref 0.57–1.00)
Chloride: 99 mmol/L (ref 96–106)
GFR, EST AFRICAN AMERICAN: 112 mL/min/{1.73_m2} (ref >59)
GFR, EST NON AFRICAN AMERICAN: 97 mL/min/{1.73_m2} (ref >59)
GLOBULIN, TOTAL: 2.9 g/dL (ref 1.5–4.5)
Glucose: 112 mg/dL — ABNORMAL HIGH (ref 65–99)
POTASSIUM: 4.4 mmol/L (ref 3.5–5.2)
SODIUM: 142 mmol/L (ref 134–144)
TOTAL PROTEIN: 7.1 g/dL (ref 6.0–8.5)

## 2015-02-27 LAB — CBC WITH DIFFERENTIAL/PLATELET
BASOS ABS: 0 10*3/uL (ref 0.0–0.2)
Basos: 0 %
EOS (ABSOLUTE): 0.1 10*3/uL (ref 0.0–0.4)
Eos: 1 %
HEMOGLOBIN: 13.1 g/dL (ref 11.1–15.9)
Hematocrit: 39.5 % (ref 34.0–46.6)
IMMATURE GRANS (ABS): 0 10*3/uL (ref 0.0–0.1)
Immature Granulocytes: 0 %
LYMPHS ABS: 2.4 10*3/uL (ref 0.7–3.1)
LYMPHS: 29 %
MCH: 29.4 pg (ref 26.6–33.0)
MCHC: 33.2 g/dL (ref 31.5–35.7)
MCV: 89 fL (ref 79–97)
MONOCYTES: 8 %
Monocytes Absolute: 0.6 10*3/uL (ref 0.1–0.9)
Neutrophils Absolute: 5.2 10*3/uL (ref 1.4–7.0)
Neutrophils: 62 %
PLATELETS: 324 10*3/uL (ref 150–379)
RBC: 4.45 x10E6/uL (ref 3.77–5.28)
RDW: 14.2 % (ref 12.3–15.4)
WBC: 8.3 10*3/uL (ref 3.4–10.8)

## 2015-02-27 LAB — MICROALBUMIN / CREATININE URINE RATIO
CREATININE, UR: 50.5 mg/dL
MICROALB/CREAT RATIO: <5.9 mg/g creat (ref 0.0–30.0)
Microalbumin, Urine: <3 ug/mL

## 2015-02-27 LAB — LIPID PANEL W/O CHOL/HDL RATIO
Cholesterol, Total: 203 mg/dL — ABNORMAL HIGH (ref 100–199)
HDL: 38 mg/dL — ABNORMAL LOW (ref >39)
LDL CALC: 146 mg/dL — AB (ref 0–99)
Triglycerides: 96 mg/dL (ref 0–149)
VLDL Cholesterol Cal: 19 mg/dL (ref 5–40)

## 2015-02-27 LAB — URIC ACID: URIC ACID: 3.8 mg/dL (ref 2.5–7.1)

## 2015-02-27 LAB — TSH: TSH: 2.27 u[IU]/mL (ref 0.450–4.500)

## 2015-02-27 LAB — HGB A1C W/O EAG: HEMOGLOBIN A1C: 5.8 % — AB (ref 4.8–5.6)

## 2015-02-28 LAB — UA/M W/RFLX CULTURE, ROUTINE
Bilirubin, UA: NEGATIVE
GLUCOSE, UA: NEGATIVE
Ketones, UA: NEGATIVE
NITRITE UA: NEGATIVE
PROTEIN UA: NEGATIVE
UUROB: 0.2 mg/dL (ref 0.2–1.0)
pH, UA: 5.5 (ref 5.0–7.5)

## 2015-02-28 LAB — URINE CULTURE, REFLEX: ORGANISM ID, BACTERIA: NO GROWTH

## 2015-02-28 LAB — MICROSCOPIC EXAMINATION

## 2015-03-01 ENCOUNTER — Telehealth: Payer: Self-pay | Admitting: Family Medicine

## 2015-03-01 MED ORDER — AMOXICILLIN 500 MG PO TABS: 500.0000 mg | ORAL_TABLET | Freq: Three times a day (TID) | ORAL | Status: DC

## 2015-03-01 NOTE — Telephone Encounter (Signed)
Pt called and stated that is having a reaction to the medication prescribed for her uti and she would like to have something else called in to [facility].

## 2015-03-01 NOTE — Telephone Encounter (Signed)
Can you please find out the reaction, add to adverse reactions/allergies if appropriate, have her STOP the medicine, remove from med list, and then send back to me I'll send in new medicine, but it will help me to have the reaction in the list for the next prescription

## 2015-03-01 NOTE — Telephone Encounter (Signed)
Patient returned call. She stated that she broke out on her back, was itching, and got sick to her stomach. States she stopped the medication as soon as this started. Medication was removed from current medication list and added to allergy list.

## 2015-03-01 NOTE — Telephone Encounter (Signed)
Called and left patient a voicemail asking for her to please return my call.  

## 2015-03-01 NOTE — Telephone Encounter (Signed)
Urine culture reviewed; no growth, but that is really inconsistent with the way her urine looked; I wonder if she took an antibiotic prior to seeing me which maybe inhibited growth Nevertheless, will send another Rx I spoke with patient, explained new Rx sent

## 2015-03-01 NOTE — Telephone Encounter (Signed)
Routing to Dr. Lada 

## 2015-03-04 ENCOUNTER — Telehealth: Payer: Self-pay

## 2015-03-04 NOTE — Telephone Encounter (Signed)
Patient states that the Onglyza is 95 dollars a month. She would like to know if there is something different that she can take that is cheaper Patient also states that the prescription for the walker needs a ICD 10 code FYI: she was able to get her diabetic shoes without a form.

## 2015-03-05 NOTE — Telephone Encounter (Signed)
I called, pt not there right now, back in 45 minutes female said; I'll call back

## 2015-03-05 NOTE — Telephone Encounter (Signed)
-----   Message from Pala sent at 03/04/2015  2:35 PM EST ----- Patient was taking cholesterol medication, but had eaten.

## 2015-03-08 MED ORDER — HUGO ROLLING WALKER ELITE MISC: Status: DC

## 2015-03-08 MED ORDER — NYSTATIN 100000 UNIT/GM EX CREA: 1.0000 "application " | TOPICAL_CREAM | Freq: Two times a day (BID) | CUTANEOUS | Status: DC

## 2015-03-08 MED ORDER — SITAGLIPTIN PHOSPHATE 100 MG PO TABS: 100.0000 mg | ORAL_TABLET | Freq: Every day | ORAL | Status: DC

## 2015-03-08 NOTE — Telephone Encounter (Signed)
Still having itching and rash under belly button, now up to her neck; using powder She can't pick up the walker, 4 wheels and a seat; M51.37 New rx sent to pharmacy for cream nystatin, new walker (talked w/pharmacy staff), and new [place]

## 2015-03-23 ENCOUNTER — Encounter: Payer: Self-pay | Admitting: Anesthesiology

## 2015-03-23 ENCOUNTER — Ambulatory Visit: Payer: Medicare Other | Attending: Anesthesiology | Admitting: Anesthesiology

## 2015-03-23 VITALS — BP 117/78 | HR 77 | Temp 98.1°F | Resp 16 | Ht 62.0 in | Wt 154.0 lb

## 2015-03-23 DIAGNOSIS — F329 Major depressive disorder, single episode, unspecified: Secondary | ICD-10-CM | POA: Insufficient documentation

## 2015-03-23 DIAGNOSIS — M5416 Radiculopathy, lumbar region: Secondary | ICD-10-CM

## 2015-03-23 DIAGNOSIS — M5030 Other cervical disc degeneration, unspecified cervical region: Secondary | ICD-10-CM

## 2015-03-23 DIAGNOSIS — M545 Low back pain: Secondary | ICD-10-CM | POA: Diagnosis not present

## 2015-03-23 DIAGNOSIS — Q675 Congenital deformity of spine: Secondary | ICD-10-CM | POA: Insufficient documentation

## 2015-03-23 DIAGNOSIS — M5431 Sciatica, right side: Secondary | ICD-10-CM

## 2015-03-23 DIAGNOSIS — M4120 Other idiopathic scoliosis, site unspecified: Secondary | ICD-10-CM

## 2015-03-23 DIAGNOSIS — M5441 Lumbago with sciatica, right side: Secondary | ICD-10-CM | POA: Diagnosis not present

## 2015-03-23 DIAGNOSIS — G8929 Other chronic pain: Secondary | ICD-10-CM | POA: Insufficient documentation

## 2015-03-23 DIAGNOSIS — M5137 Other intervertebral disc degeneration, lumbosacral region: Secondary | ICD-10-CM

## 2015-03-23 DIAGNOSIS — M5116 Intervertebral disc disorders with radiculopathy, lumbar region: Secondary | ICD-10-CM | POA: Diagnosis not present

## 2015-03-23 MED ORDER — OXYCODONE HCL 15 MG PO TABS: 15.0000 mg | ORAL_TABLET | Freq: Two times a day (BID) | ORAL | Status: DC

## 2015-03-23 NOTE — Patient Instructions (Signed)
You were given a prescription for Oxycodone today. 

## 2015-03-23 NOTE — Progress Notes (Signed)
   Subjective:    Patient ID: Deborah Blackwell, female    DOB: Jun 20, 1960, 55 y.o.   MRN: [id number]  HPI  This patient returned to the clinic today indicating that she is doing well Her pain is reasonably well controlled Subjective pain intensity rating is 40% She volunteers that she is not as depressed as she has been in the past and she was in fact concerned that I thought she was depressed. Nevertheless she continues to be under psychiatric care    Review of Systems  Constitutional: Negative.   HENT: Negative.   Eyes: Negative.   Cardiovascular: Negative.   Gastrointestinal: Negative.   Genitourinary: Negative.   Musculoskeletal: Positive for myalgias, back pain and arthralgias. Negative for joint swelling, gait problem, neck pain and neck stiffness.  Skin: Negative.   Allergic/Immunologic: Negative.   Neurological: Negative.   Hematological: Negative.   Psychiatric/Behavioral:       This patient is being treated by Dr. Faith Rogue for depression She looks quite well today and in fact she tells me that she is not as depressed as she was before       Objective:   Physical Exam  Cardiovascular:  This patient is alert and appears well-oriented Apparently her depression is improving significantly Blood pressure is 117/78 mmHg Pulse is 77 bpm Equal and regular Heart sounds 1 and 2 were heard in all areas There were no audible murmurs Temperature was 98.61F Respirations were 16 breaths per minute Chest is clinically clear There are no adventitious sounds Abdomen is soft and nontender There is no palpable organomegaly And no significant lymphadenopathy Pupils are equal and reactive Cranial nerves are intact There are no new neurological or musculoskeletal findings  Nursing note and vitals reviewed.         Assessment & Plan:   Assessment 1 chronic low back pain 2 lumbar degenerative disc disease 3 lumbar radiculopathy 4 congenital scoliosis 5 status post  depression  Plan of management 1 Will gradually begin to decrease her Roxicodone 2 Will continue her on Roxicodone 15 mg one to 2 tablets twice a day and will give her 45 instead of 60 tablets 3 Will follow-up with her in one month    Established patient    level III    Lance Bosch M.D.

## 2015-03-23 NOTE — Progress Notes (Signed)
Safety precautions to be maintained throughout the outpatient stay will include: orient to surroundings, keep bed in low position, maintain call bell within reach at all times, provide assistance with transfer out of bed and ambulation.  

## 2015-03-25 ENCOUNTER — Telehealth: Payer: Self-pay | Admitting: Anesthesiology

## 2015-03-25 NOTE — Telephone Encounter (Signed)
Cannot make it on just one pill a day, pain is to much and she cannot function like this, please call

## 2015-03-26 NOTE — Telephone Encounter (Signed)
Spoke with patient and she is concerned that she is having to take more than 1 pill per day at the present to control; her pain.  Informed patient that on the days she can take just 1, then she needed to do that to make her pills last till her next appointment.  Patient states understanding and just wanted it documented that she called.

## 2015-03-30 ENCOUNTER — Ambulatory Visit: Payer: Self-pay | Admitting: Psychiatry

## 2015-04-20 ENCOUNTER — Telehealth: Payer: Self-pay | Admitting: Anesthesiology

## 2015-04-20 ENCOUNTER — Encounter: Payer: Self-pay | Admitting: Anesthesiology

## 2015-04-20 ENCOUNTER — Ambulatory Visit: Payer: Medicare Other | Attending: Anesthesiology | Admitting: Anesthesiology

## 2015-04-20 VITALS — BP 120/86 | HR 80 | Temp 97.7°F | Resp 16 | Ht 61.0 in | Wt 142.0 lb

## 2015-04-20 DIAGNOSIS — M4806 Spinal stenosis, lumbar region: Secondary | ICD-10-CM | POA: Diagnosis not present

## 2015-04-20 DIAGNOSIS — M545 Low back pain: Secondary | ICD-10-CM | POA: Insufficient documentation

## 2015-04-20 DIAGNOSIS — M5137 Other intervertebral disc degeneration, lumbosacral region: Secondary | ICD-10-CM

## 2015-04-20 DIAGNOSIS — M5116 Intervertebral disc disorders with radiculopathy, lumbar region: Secondary | ICD-10-CM | POA: Diagnosis not present

## 2015-04-20 DIAGNOSIS — M5416 Radiculopathy, lumbar region: Secondary | ICD-10-CM

## 2015-04-20 DIAGNOSIS — G8929 Other chronic pain: Secondary | ICD-10-CM | POA: Diagnosis not present

## 2015-04-20 DIAGNOSIS — M5431 Sciatica, right side: Secondary | ICD-10-CM

## 2015-04-20 DIAGNOSIS — M5441 Lumbago with sciatica, right side: Secondary | ICD-10-CM | POA: Diagnosis not present

## 2015-04-20 MED ORDER — OXYCODONE HCL 15 MG PO TABS: 15.0000 mg | ORAL_TABLET | Freq: Two times a day (BID) | ORAL | Status: DC

## 2015-04-20 NOTE — Telephone Encounter (Signed)
Patient will reschedule appt for today at 1300.

## 2015-04-20 NOTE — Telephone Encounter (Signed)
Out of meds 3 days, has appt 04-22-15 wants to come today, wants to speak with Dena asap

## 2015-04-20 NOTE — Progress Notes (Signed)
   Subjective:    Patient ID: Deborah Blackwell, female    DOB: Oct 05, 1960, 55 y.o.   MRN: [id number]  HPI  This pleasant lady return to the clinic today indicating that she was coping quite well following her last procedure Her subjective pain intensity rating was 40% She indicates that because of the oxycodone she is able to help take care of her husband who recently had a stroke She is able to perform her activities a little slower than before but she still able to do so.   Review of Systems  Constitutional: Negative.   HENT: Negative.   Eyes: Negative.   Respiratory: Negative.   Cardiovascular: Negative.   Gastrointestinal: Negative.   Endocrine: Negative.   Genitourinary: Negative.   Musculoskeletal: Negative.   Skin: Negative.   Allergic/Immunologic: Negative.   Neurological: Negative.   Hematological: Negative.   Psychiatric/Behavioral: Negative.        Objective:   Physical Exam  Cardiovascular:  This patient is in no distress Her blood pressure is 120/86 mmHg Her pulse is 80 bpm Equal and regular Heart sounds 1 and 2 were heard in all areas There were no audible murmurs Temperature is 97.19F Respirations are 16 breaths per minute SPO2 was 98% Chest is clinically clear There are no adventitious sounds Abdomen is soft and nontender There is no palpable organomegaly There Is no significant lymphadenopathy Pupils are equal and reactive Cranial nerves are intact There are no new neurological or musculoskeletal findings  Nursing note and vitals reviewed.         Assessment & Plan:    ssessment 1 chronic low back pain 2  lumbar degenerative disc disease 3 lumbar radiculopathy For lumbar spinal stenosis   Plan of management We'll defer all interventions for the time being We'll plan to continue her on oxycodone 5 milligrams twice a day and give her 60 tablets We'll follow up with her in 1 month   Established patient       Level III   Lance Bosch M.D.

## 2015-04-20 NOTE — Progress Notes (Signed)
premenstrual tension syndrome  

## 2015-04-22 ENCOUNTER — Ambulatory Visit: Payer: Medicare Other | Admitting: Anesthesiology

## 2015-05-21 ENCOUNTER — Ambulatory Visit: Payer: Medicare Other | Attending: Anesthesiology | Admitting: Anesthesiology

## 2015-05-21 ENCOUNTER — Encounter: Payer: Self-pay | Admitting: Anesthesiology

## 2015-05-21 VITALS — BP 112/80 | HR 86 | Temp 97.5°F | Resp 18 | Ht 61.0 in | Wt 142.0 lb

## 2015-05-21 DIAGNOSIS — G8929 Other chronic pain: Secondary | ICD-10-CM | POA: Insufficient documentation

## 2015-05-21 DIAGNOSIS — M4120 Other idiopathic scoliosis, site unspecified: Secondary | ICD-10-CM | POA: Insufficient documentation

## 2015-05-21 DIAGNOSIS — M5137 Other intervertebral disc degeneration, lumbosacral region: Secondary | ICD-10-CM | POA: Diagnosis not present

## 2015-05-21 DIAGNOSIS — M5010 Cervical disc disorder with radiculopathy, unspecified cervical region: Secondary | ICD-10-CM

## 2015-05-21 DIAGNOSIS — F329 Major depressive disorder, single episode, unspecified: Secondary | ICD-10-CM | POA: Diagnosis not present

## 2015-05-21 DIAGNOSIS — F1190 Opioid use, unspecified, uncomplicated: Secondary | ICD-10-CM | POA: Insufficient documentation

## 2015-05-21 DIAGNOSIS — M545 Low back pain: Secondary | ICD-10-CM | POA: Diagnosis not present

## 2015-05-21 DIAGNOSIS — M542 Cervicalgia: Secondary | ICD-10-CM | POA: Diagnosis not present

## 2015-05-21 DIAGNOSIS — M5416 Radiculopathy, lumbar region: Secondary | ICD-10-CM | POA: Diagnosis not present

## 2015-05-21 DIAGNOSIS — M5441 Lumbago with sciatica, right side: Secondary | ICD-10-CM | POA: Diagnosis not present

## 2015-05-21 DIAGNOSIS — M5030 Other cervical disc degeneration, unspecified cervical region: Secondary | ICD-10-CM | POA: Insufficient documentation

## 2015-05-21 MED ORDER — OXYCODONE HCL 15 MG PO TABS: 15.0000 mg | ORAL_TABLET | Freq: Two times a day (BID) | ORAL | Status: DC

## 2015-05-21 NOTE — Progress Notes (Signed)
   Subjective:    Patient ID: Deborah Blackwell, female    DOB: 1960-07-06, 55 y.o.   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 and that continued use of opioids especially in high doses puts her at risk to opioid-induced respiratory depression and possible death She appeared to understand this explanation and I further pointed out to her that I would be aggressively working to decrease the amount of opioids and doses that she is taking She reluctantly understood and agreed with my rationale Today her subjective pain intensity rating is 80% and this possibly rub presents pain dramatization and some pain exaggeration probably secondary to continued opioid use She has been takes taking Roxicodone 15 mg 3 times a day and I told her that she would be only getting 50 mg twice a day She accepted this recommendation  Review of Systems  Constitutional: Negative.        Patient ambulates with the assistance of a walker  Eyes: Negative.   Respiratory: Negative.   Cardiovascular: Negative.   Endocrine: Negative.   Genitourinary: Negative.   Allergic/Immunologic: Negative.   Neurological: Negative.   Hematological: Negative.   Psychiatric/Behavioral:       Patient has a long history of severe depression and is under the care of her psychiatrist for continued management of her depression       Objective:   Physical Exam  Cardiovascular:  Patient does not appear  to be in any distress Blood pressure is 112/80 mmHg Pulse is 86 bpm Equal and regular Heart sounds 1 and 2 were heard in all areas There were no audible murmurs Temperature is 97.75F Respirations 18 breaths per minute SPO2 was 99% Chest is clinically clear No  adventitious sounds Abdomen is soft and nontender No palpable organomegaly There is no significant lymphadenopathy Pupils are equal and reactive Cranial nerves are intact  Nursing note and vitals reviewed.         Assessment & Plan:    Assessment 1 chronic low back pain 2. Idiopathic scoliosis 3 chronic cervical pain 4 cervical degenerative disc disease 5 chronic depression 6 long-term opioid usage    Plan of management 1 Will continue the patient on Roxicodone 15 mg every 12 hours when necessary and will give her 60 tablets 2 we'll follow-up with her in 1 month    Established patient     level III    Lance Bosch M.D.

## 2015-05-21 NOTE — Progress Notes (Signed)
Safety precautions to be maintained throughout the outpatient stay will include: orient to surroundings, keep bed in low position, maintain call bell within reach at all times, provide assistance with transfer out of bed and ambulation.  

## 2015-05-21 NOTE — Patient Instructions (Signed)
You were given a prescription for Oxycodone today. 

## 2015-05-25 ENCOUNTER — Ambulatory Visit (INDEPENDENT_AMBULATORY_CARE_PROVIDER_SITE_OTHER): Payer: 59 | Admitting: Psychiatry

## 2015-05-25 ENCOUNTER — Encounter: Payer: Self-pay | Admitting: Psychiatry

## 2015-05-25 VITALS — BP 122/86 | HR 78 | Temp 97.4°F | Ht 61.0 in | Wt 149.0 lb

## 2015-05-25 DIAGNOSIS — F332 Major depressive disorder, recurrent severe without psychotic features: Secondary | ICD-10-CM

## 2015-05-25 DIAGNOSIS — F411 Generalized anxiety disorder: Secondary | ICD-10-CM

## 2015-05-25 MED ORDER — HYDROXYZINE PAMOATE 25 MG PO CAPS: 25.0000 mg | ORAL_CAPSULE | Freq: Two times a day (BID) | ORAL | Status: DC | PRN

## 2015-05-25 MED ORDER — ARIPIPRAZOLE 10 MG PO TABS: 10.0000 mg | ORAL_TABLET | Freq: Every day | ORAL | Status: DC

## 2015-05-25 MED ORDER — ALPRAZOLAM 1 MG PO TABS: ORAL_TABLET | ORAL | Status: DC

## 2015-05-25 MED ORDER — FLUOXETINE HCL 40 MG PO CAPS: 40.0000 mg | ORAL_CAPSULE | Freq: Every day | ORAL | Status: DC

## 2015-05-25 NOTE — Progress Notes (Signed)
Patient ID: Deborah Blackwell, female   DOB: 1960/10/16, 55 y.o.   MRN: [id number] [facility] MD/PA/NP OP Progress Note  05/25/2015 10:17 AM BLAKESLEY GEHMAN  MRN:  [id number]  Subjective:  Patient returns for follow-up of her major depressive disorder, severe without psychotic features.and generalized anxiety. Patient was previously seen by Dr.Williams and this is the first appointment for this patient. Patient presents with her husband today. She states that she's been doing okay. States she is been taking her medications regularly. Sleeping okay and eating okay. Denies any suicidal thoughts. Discussed with patient the high amount of alprazolam she is on and how there is no evidence for long-term efficacy of the alprazolam. Patient states that she's been taking it for 30 years and she is very anxious about coming off it. Patient was given the choice that she would see a different physician to continue this medication on the good work on a taper and adjust other medications. Patient states that she will give this a try.   Chief Complaint: Good Chief Complaint    Follow-up; Medication Refill     Visit Diagnosis:     ICD-9-CM ICD-10-CM   1. Major depressive disorder, recurrent, severe without psychotic features ([place]) 296.33 F33.2     Past Medical History:  Past Medical History  Diagnosis Date  . History of scoliosis     was casted as a child  . Angina at rest [facility])   . Arthritis   . Asthma   . GERD (gastroesophageal reflux disease)   . COPD (chronic obstructive pulmonary disease) ([place])   . Diabetes mellitus without complication ([place])   . Hypertension   . Hyperlipidemia   . Gout   . RA (rheumatoid arthritis) ([place])   . History of kidney stones   . Migraines   . Depression   . Panic attack   . Bipolar disorder ([place])   . Congenital scoliosis   . DDD (degenerative disc disease), lumbar     Past Surgical History  Procedure Laterality Date  . Abdominal hysterectomy  2003    one ovary remains: due to  heavy bleeding/endometrioma/cysts  . Carpal tunnel release    . Back surgery      and injections  . Foot surgery Right     bone spur  . Polypectomy      uterine   Family History:  Family History  Problem Relation Age of Onset  . Alzheimer's disease Mother   . Dementia Mother   . Cancer Mother     colon  . Heart disease Mother   . Hypertension Mother   . Depression Mother   . Alzheimer's disease Father   . Dementia Father   . Hypertension Father   . Cancer Brother     lung  . Lung disease Brother   . Hypertension Sister    Social History:  Social History   Social History  . Marital Status: Married    Spouse Name: N/A  . Number of Children: N/A  . Years of Education: N/A   Social History Main Topics  . Smoking status: Former Smoker -- 2.00 packs/day for 5 years    Types: Cigarettes    Quit date: 02/02/1991  . Smokeless tobacco: Never Used  . Alcohol Use: No  . Drug Use: No  . Sexual Activity: No   Other Topics Concern  . None   Social History Narrative   Additional History:   Assessment:   Musculoskeletal: Strength & Muscle Tone: within normal  limits Gait & Station: Walks slowly with a limp Patient leans: N/A  Psychiatric Specialty Exam: HPI  Review of Systems  Musculoskeletal: Positive for joint pain (in pain management).  Psychiatric/Behavioral: Negative for depression, suicidal ideas, hallucinations, memory loss and substance abuse. The patient is not nervous/anxious and does not have insomnia.   All other systems reviewed and are negative.   Blood pressure 122/86, pulse 78, temperature 97.4 F (36.3 C), temperature source Tympanic, height 5\' 1"  (1.549 m), weight 149 lb (67.586 kg), SpO2 95 %.Body mass index is 28.17 kg/(m^2).  General Appearance: Well Groomed  Eye Contact:  Good  Speech:  Normal Rate  Volume:  Normal  Mood:  Good  Affect:  , smiling  Thought Process:  Linear and Logical  Orientation:  Full (Time, Place, and Person)   Thought Content:  Negative  Suicidal Thoughts:  No  Homicidal Thoughts:  No  Memory:  Immediate;   Good Recent;   Good Remote;   Good  Judgement:  Good  Insight:  Good  Psychomotor Activity:  Negative  Concentration:  Good  Recall:  Good  Fund of Knowledge: Good  Language: Good  Akathisia:  Negative  Handed:  Right  AIMS (if indicated):  Done at last visit and was normal   Assets:  Communication Skills Desire for Improvement Social Support  ADL's:  Intact  Cognition: WNL  Sleep:  fair   Is the patient at risk to self?  No. Has the patient been a risk to self in the past 6 months?  No. Has the patient been a risk to self within the distant past?  Yes.   Is the patient a risk to others?  No. Has the patient been a risk to others in the past 6 months?  No. Has the patient been a risk to others within the distant past?  No.  Current Medications: Current Outpatient Prescriptions  Medication Sig Dispense Refill  . albuterol (VENTOLIN HFA) 108 (90 BASE) MCG/ACT inhaler Inhale 2 puffs into the lungs every 6 (six) hours as needed for wheezing or shortness of breath.    . ALPRAZolam (XANAX) 1 MG tablet Take 1 tablet by mouth twice daily for one month and return to the clinic. Plan is to taper Xanax after that to 0.5 mg twice daily for one month and then 0.5 mg once daily for 1 month 60 tablet 0  . amLODipine (NORVASC) 10 MG tablet Take 10 mg by mouth daily. Reported on 03/23/2015    . ARIPiprazole (ABILIFY) 10 MG tablet Take 1 tablet (10 mg total) by mouth daily. 30 tablet 1  . atenolol (TENORMIN) 50 MG tablet Take 1 tablet (50 mg total) by mouth 2 (two) times daily. 180 tablet 1  . atorvastatin (LIPITOR) 40 MG tablet Take 40 mg by mouth at bedtime.    . budesonide-formoterol (SYMBICORT) 160-4.5 MCG/ACT inhaler Inhale 2 puffs into the lungs 2 (two) times daily.    Marland Kitchen FLUoxetine (PROZAC) 40 MG capsule Take 1 capsule (40 mg total) by mouth daily. 30 capsule 1  . furosemide (LASIX) 40 MG  tablet Take 40 mg by mouth daily. Reported on 03/23/2015    . levothyroxine (SYNTHROID, LEVOTHROID) 75 MCG tablet Take 1 tablet (75 mcg total) by mouth daily before breakfast. 5 tablet 0  . losartan (COZAAR) 100 MG tablet Take 1 tablet (100 mg total) by mouth daily. 90 tablet 1  . oxyCODONE (ROXICODONE) 15 MG immediate release tablet Take 1 tablet (15 mg total) by mouth  2 (two) times daily after a meal. 60 tablet 0  . sitaGLIPtin (JANUVIA) 100 MG tablet Take 1 tablet (100 mg total) by mouth daily. 30 tablet 3  . hydrOXYzine (VISTARIL) 25 MG capsule Take 1 capsule (25 mg total) by mouth 2 (two) times daily as needed. 30 capsule 0   No current facility-administered medications for this visit.    Medical Decision Making:  Established Problem, Stable/Improving (1) and Review of Medication Regimen & Side Effects (2)  Treatment Plan Summary:Medication management and Plan patient has been stable on her regimen without any side effects.   Major depressive disorder, recurrent severe without psychosis. Continue her Prozac 40 mg a day, Abilify 10 mg daily.   Generalized anxieties order- Decrease alprazolam to 1 mg twice daily for one month. Patient was given a prescription for 60 tablets. Plan is to taper further to 0.5 mg twice daily for one month and then taper 2.5 mg once daily for a month. Patient states she is nervous about this but willing to give it a try. She also requested a list of other physicians. She stated that her primary care physician is not willing to prescribe her the Xanax. Patient prescribed Vistaril 25 mg to take twice daily as needed for anxiety.  Return to clinic in 1 month's time or call before if needed.   Joette Schmoker 05/25/2015, 10:17 AM

## 2015-05-26 ENCOUNTER — Ambulatory Visit: Payer: Self-pay | Admitting: Family Medicine

## 2015-06-02 ENCOUNTER — Ambulatory Visit: Payer: Self-pay | Admitting: Family Medicine

## 2015-06-18 ENCOUNTER — Encounter: Payer: Self-pay | Admitting: Anesthesiology

## 2015-06-18 ENCOUNTER — Ambulatory Visit: Payer: Medicare Other | Attending: Anesthesiology | Admitting: Anesthesiology

## 2015-06-18 VITALS — BP 119/73 | HR 96 | Temp 98.5°F | Resp 18 | Ht 61.0 in | Wt 132.0 lb

## 2015-06-18 DIAGNOSIS — F329 Major depressive disorder, single episode, unspecified: Secondary | ICD-10-CM | POA: Diagnosis not present

## 2015-06-18 DIAGNOSIS — M545 Low back pain: Secondary | ICD-10-CM | POA: Diagnosis not present

## 2015-06-18 DIAGNOSIS — M5441 Lumbago with sciatica, right side: Secondary | ICD-10-CM | POA: Diagnosis not present

## 2015-06-18 DIAGNOSIS — G8929 Other chronic pain: Secondary | ICD-10-CM | POA: Diagnosis not present

## 2015-06-18 DIAGNOSIS — M5137 Other intervertebral disc degeneration, lumbosacral region: Secondary | ICD-10-CM | POA: Diagnosis not present

## 2015-06-18 DIAGNOSIS — M5431 Sciatica, right side: Secondary | ICD-10-CM

## 2015-06-18 DIAGNOSIS — M5416 Radiculopathy, lumbar region: Secondary | ICD-10-CM | POA: Diagnosis not present

## 2015-06-18 DIAGNOSIS — M5116 Intervertebral disc disorders with radiculopathy, lumbar region: Secondary | ICD-10-CM | POA: Diagnosis not present

## 2015-06-18 MED ORDER — OXYCODONE HCL 15 MG PO TABS: 15.0000 mg | ORAL_TABLET | Freq: Two times a day (BID) | ORAL | Status: DC

## 2015-06-18 NOTE — Progress Notes (Signed)
Safety precautions to be maintained throughout the outpatient stay will include: orient to surroundings, keep bed in low position, maintain call bell within reach at all times, provide assistance with transfer out of bed and ambulation.  

## 2015-06-18 NOTE — Patient Instructions (Signed)
You were given a prescription for Oxycodone today. 

## 2015-06-21 NOTE — Progress Notes (Signed)
   Subjective:    Patient ID: Deborah Blackwell, female    DOB: 02-23-1960, 55 y.o.   MRN: [id number]  HPI  This patient return to the clinic today indicating that she is doing quite well Her subjective pain intensity rating is 40% She is well maintained on Roxicodone 15 mg  She has been taken this medication for quite some time She has been in fact taking more medication  but she is currently weaned down to  Roxicodone 15 mg twice a day She has a long history of depression And she is currently under psychiatric  Care I have raised the subject of continuing to reduce her opioids but she gets very agitated and upset at that thought.  Review of Systems  Constitutional: Negative.   HENT: Negative.   Eyes: Negative.   Respiratory: Negative.   Cardiovascular: Negative.   Gastrointestinal: Negative.   Endocrine: Negative.   Genitourinary: Negative.   Musculoskeletal: Negative.   Skin: Negative.   Allergic/Immunologic: Negative.   Neurological: Negative.   Hematological: Negative.   Psychiatric/Behavioral: Negative.        She has a long history of depression and is being  Treated by her psychiatrist at this time       Objective:   Physical Exam  Cardiovascular:  This patient appears to be in no distress  She is currently doing fine Her vital signs are stable Blood pressure is 119/73 mmHg Her pulse is 96 bpm Temperature is 98.70F  Respirations are 18 progress per minute SPO2 is 99% There are no new neurological nor musculoskeletal findings  Nursing note and vitals reviewed.         Assessment & Plan:   Assessment 1 chronic low back pain 2 lumbar degenerative disc disease 3 lumbar radiculopathy   Plan of management 1 we will continue Roxicodone 15 mg twice a day # 60 tablets 2 we'll follow-up with her in 1 month   Established patient       Level III   Lance Bosch M.D.

## 2015-06-24 ENCOUNTER — Ambulatory Visit (INDEPENDENT_AMBULATORY_CARE_PROVIDER_SITE_OTHER): Payer: 59 | Admitting: Psychiatry

## 2015-06-24 ENCOUNTER — Encounter: Payer: Self-pay | Admitting: Psychiatry

## 2015-06-24 VITALS — BP 127/87 | HR 77 | Temp 97.7°F | Ht 61.0 in | Wt 145.8 lb

## 2015-06-24 DIAGNOSIS — F411 Generalized anxiety disorder: Secondary | ICD-10-CM

## 2015-06-24 DIAGNOSIS — F332 Major depressive disorder, recurrent severe without psychotic features: Secondary | ICD-10-CM | POA: Diagnosis not present

## 2015-06-24 MED ORDER — ALPRAZOLAM 0.5 MG PO TABS: ORAL_TABLET | ORAL | Status: DC

## 2015-06-24 MED ORDER — ARIPIPRAZOLE 10 MG PO TABS: 10.0000 mg | ORAL_TABLET | Freq: Every day | ORAL | Status: DC

## 2015-06-24 MED ORDER — FLUOXETINE HCL 20 MG PO CAPS: 60.0000 mg | ORAL_CAPSULE | Freq: Every day | ORAL | Status: DC

## 2015-06-24 MED ORDER — FLUOXETINE HCL 40 MG PO CAPS: 40.0000 mg | ORAL_CAPSULE | Freq: Every day | ORAL | Status: DC

## 2015-06-24 NOTE — Progress Notes (Signed)
Patient ID: Deborah Blackwell, female   DOB: 1960/12/02, 55 y.o.   MRN: [id number]  [facility] MD/PA/NP OP Progress Note  06/24/2015 10:36 AM DELENN FETTING  MRN:  [id number]  Subjective:  Patient returns for follow-up of her major depressive disorder, severe without psychotic features.and generalized anxiety. States she has been able to taper off the Xanax to 1mg  twice daily, states she is agreeable to a furthur taper. Sleeping okay and eating okay. Patient reports being very stressed since her husband had another stroke and had to be hospitalized. States that he is back home but has a speech impediment and some trouble swallowing. States that he also has congestive heart failure and has had 2 strokes so far. States that the doctors have said that it is a matter of time. Patient became very tearful in the session and stated that she has been married for 33 years and he is her best friend. States that she wakes up very early in the mornings and thinks about her life without him. Denies any suicidal thoughts.   Chief Complaint: Stressed about husband's health  Visit Diagnosis:     ICD-9-CM ICD-10-CM   1. Major depressive disorder, recurrent, severe without psychotic features ([place]) 296.33 F33.2   2. Generalized anxiety disorder 300.02 F41.1     Past Medical History:  Past Medical History  Diagnosis Date  . History of scoliosis     was casted as a child  . Angina at rest [facility])   . Arthritis   . Asthma   . GERD (gastroesophageal reflux disease)   . COPD (chronic obstructive pulmonary disease) ([place])   . Diabetes mellitus without complication ([place])   . Hypertension   . Hyperlipidemia   . Gout   . RA (rheumatoid arthritis) ([place])   . History of kidney stones   . Migraines   . Depression   . Panic attack   . Bipolar disorder ([place])   . Congenital scoliosis   . DDD (degenerative disc disease), lumbar     Past Surgical History  Procedure Laterality Date  . Abdominal hysterectomy  2003    one ovary  remains: due to heavy bleeding/endometrioma/cysts  . Carpal tunnel release    . Back surgery      and injections  . Foot surgery Right     bone spur  . Polypectomy      uterine   Family History:  Family History  Problem Relation Age of Onset  . Alzheimer's disease Mother   . Dementia Mother   . Cancer Mother     colon  . Heart disease Mother   . Hypertension Mother   . Depression Mother   . Alzheimer's disease Father   . Dementia Father   . Hypertension Father   . Cancer Brother     lung  . Lung disease Brother   . Hypertension Sister    Social History:  Social History   Social History  . Marital Status: Married    Spouse Name: N/A  . Number of Children: N/A  . Years of Education: N/A   Social History Main Topics  . Smoking status: Former Smoker -- 2.00 packs/day for 5 years    Types: Cigarettes    Quit date: 02/02/1991  . Smokeless tobacco: Never Used  . Alcohol Use: No  . Drug Use: No  . Sexual Activity: No   Other Topics Concern  . None   Social History Narrative   Additional History:   Assessment:  Musculoskeletal: Strength & Muscle Tone: within normal limits Gait & Station: Walks slowly with a limp Patient leans: N/A  Psychiatric Specialty Exam: HPI  Review of Systems  Musculoskeletal: Positive for joint pain (in pain management).  Psychiatric/Behavioral: Negative for depression, suicidal ideas, hallucinations, memory loss and substance abuse. The patient is not nervous/anxious and does not have insomnia.   All other systems reviewed and are negative.   Blood pressure 127/87, pulse 77, temperature 97.7 F (36.5 C), height 5\' 1"  (1.549 m), weight 145 lb 12.8 oz (66.134 kg), SpO2 94 %.Body mass index is 27.56 kg/(m^2).  General Appearance: Well Groomed  Eye Contact:  Good  Speech:  Normal Rate  Volume:  Normal  Mood:  okay  Affect:  stressed  Thought Process:  Linear and Logical  Orientation:  Full (Time, Place, and Person)  Thought  Content:  Negative  Suicidal Thoughts:  No  Homicidal Thoughts:  No  Memory:  Immediate;   Good Recent;   Good Remote;   Good  Judgement:  Good  Insight:  Good  Psychomotor Activity:  Negative  Concentration:  Good  Recall:  Good  Fund of Knowledge: Good  Language: Good  Akathisia:  Negative  Handed:  Right  AIMS (if indicated):  Done at last visit and was normal   Assets:  Communication Skills Desire for Improvement Social Support  ADL's:  Intact  Cognition: WNL  Sleep:  fair   Is the patient at risk to self?  No. Has the patient been a risk to self in the past 6 months?  No. Has the patient been a risk to self within the distant past?  Yes.   Is the patient a risk to others?  No. Has the patient been a risk to others in the past 6 months?  No. Has the patient been a risk to others within the distant past?  No.  Current Medications: Current Outpatient Prescriptions  Medication Sig Dispense Refill  . albuterol (VENTOLIN HFA) 108 (90 BASE) MCG/ACT inhaler Inhale 2 puffs into the lungs every 6 (six) hours as needed for wheezing or shortness of breath.    . ALPRAZolam (XANAX) 1 MG tablet Take 1 tablet by mouth twice daily for one month and return to the clinic. Plan is to taper Xanax after that to 0.5 mg twice daily for one month and then 0.5 mg once daily for 1 month 60 tablet 0  . amLODipine (NORVASC) 10 MG tablet Take 10 mg by mouth daily. Reported on 03/23/2015    . ARIPiprazole (ABILIFY) 10 MG tablet Take 1 tablet (10 mg total) by mouth daily. 30 tablet 1  . atenolol (TENORMIN) 50 MG tablet Take 1 tablet (50 mg total) by mouth 2 (two) times daily. 180 tablet 1  . atorvastatin (LIPITOR) 40 MG tablet Take 40 mg by mouth at bedtime.    . budesonide-formoterol (SYMBICORT) 160-4.5 MCG/ACT inhaler Inhale 2 puffs into the lungs 2 (two) times daily.    Marland Kitchen FLUoxetine (PROZAC) 40 MG capsule Take 1 capsule (40 mg total) by mouth daily. 30 capsule 1  . furosemide (LASIX) 40 MG tablet Take  40 mg by mouth daily. Reported on 03/23/2015    . hydrOXYzine (VISTARIL) 25 MG capsule Take 1 capsule (25 mg total) by mouth 2 (two) times daily as needed. 30 capsule 0  . levothyroxine (SYNTHROID, LEVOTHROID) 75 MCG tablet Take 1 tablet (75 mcg total) by mouth daily before breakfast. 5 tablet 0  . losartan (COZAAR) 100 MG tablet  Take 1 tablet (100 mg total) by mouth daily. 90 tablet 1  . oxyCODONE (ROXICODONE) 15 MG immediate release tablet Take 1 tablet (15 mg total) by mouth 2 (two) times daily after a meal. 60 tablet 0  . sitaGLIPtin (JANUVIA) 100 MG tablet Take 1 tablet (100 mg total) by mouth daily. 30 tablet 3   No current facility-administered medications for this visit.    Medical Decision Making:  Established Problem, Stable/Improving (1) and Review of Medication Regimen & Side Effects (2)  Treatment Plan Summary:Medication management and Plan patient has been stable on her regimen without any side effects.   Major depressive disorder, recurrent severe without psychosis. Increase Prozac to 60 mg daily, Abilify 10 mg daily.   Generalized anxieties order- Decrease alprazolam to 0.5 mg twice daily for one month. Patient was given a prescription for 60 tablets. Plan is to taper further to 0.5 mg  once daily for a month.   Return to clinic in 1 month's time or call before if needed.   Mckenzey Parcell 06/24/2015, 10:36 AM

## 2015-07-16 ENCOUNTER — Ambulatory Visit: Payer: Medicare Other | Attending: Anesthesiology | Admitting: Anesthesiology

## 2015-07-16 ENCOUNTER — Encounter: Payer: Self-pay | Admitting: Anesthesiology

## 2015-07-16 VITALS — BP 151/96 | HR 55 | Temp 98.5°F | Resp 16 | Ht 61.0 in | Wt 132.0 lb

## 2015-07-16 DIAGNOSIS — G8929 Other chronic pain: Secondary | ICD-10-CM | POA: Insufficient documentation

## 2015-07-16 DIAGNOSIS — M5416 Radiculopathy, lumbar region: Secondary | ICD-10-CM | POA: Diagnosis not present

## 2015-07-16 DIAGNOSIS — M5431 Sciatica, right side: Secondary | ICD-10-CM

## 2015-07-16 DIAGNOSIS — M5441 Lumbago with sciatica, right side: Secondary | ICD-10-CM | POA: Diagnosis not present

## 2015-07-16 DIAGNOSIS — M545 Low back pain: Secondary | ICD-10-CM | POA: Diagnosis not present

## 2015-07-16 DIAGNOSIS — F329 Major depressive disorder, single episode, unspecified: Secondary | ICD-10-CM | POA: Diagnosis not present

## 2015-07-16 DIAGNOSIS — M5116 Intervertebral disc disorders with radiculopathy, lumbar region: Secondary | ICD-10-CM | POA: Diagnosis not present

## 2015-07-16 DIAGNOSIS — M5137 Other intervertebral disc degeneration, lumbosacral region: Secondary | ICD-10-CM

## 2015-07-16 MED ORDER — OXYCODONE HCL 15 MG PO TABS: 15.0000 mg | ORAL_TABLET | Freq: Two times a day (BID) | ORAL | Status: DC

## 2015-07-16 NOTE — Patient Instructions (Signed)
You were given a prescription for Oxycodone today. 

## 2015-07-16 NOTE — Progress Notes (Signed)
Safety precautions to be maintained throughout the outpatient stay will include: orient to surroundings, keep bed in low position, maintain call bell within reach at all times, provide assistance with transfer out of bed and ambulation.  

## 2015-07-17 NOTE — Progress Notes (Signed)
   Subjective:    Patient ID: Deborah Blackwell, female    DOB: [birth year], 55 y.o.   MRN: [id number]  HPI    Review of Systems     Objective:   Physical Exam        Assessment & Plan:   Assessment 1 chronic low back pain 2 lumbar degerative disc disease 3 lumbar radiculopathy    Plan of management Would continue the patient on Roxicodone 15 mg twice a day # 60 tablets We'll follow-up with her in 1 month    Established patient      Level III    Lance Bosch M.D.

## 2015-07-17 NOTE — Progress Notes (Signed)
   Subjective:    Patient ID: Deborah Blackwell, female    DOB: [birth year], 55 y.o.   MRN: [id number]  HPI  This patient returned to the clinic today indicating that she was doing quite well Her pain was reasonably well controlled Subjective pain intensity rating was 40% The patient indicates that she was well  Controlled on the current medication although there were periods when she had a flare up of her pain She requested an increase in the frequency of her medication  But this was denied on the basis that this increased frequency might produce undesirable adverse effects The patient was also on their the care her psychiatrist for the management of depression   Review of Systems  Constitutional: Negative.   HENT: Negative.   Eyes: Negative.   Respiratory: Negative.   Cardiovascular: Negative.   Gastrointestinal: Negative.   Endocrine: Negative.   Genitourinary: Negative.   Musculoskeletal: Negative.   Skin: Negative.   Allergic/Immunologic: Negative.   Neurological: Negative.   Hematological: Negative.   Psychiatric/Behavioral:       The patient is on the care of her psychiatrist for the  Management of depression       Objective:   Physical Exam  Cardiovascular:  Patient was in no distress Vital signs were relatively stable Subjective pain intensity rating was 40% Blood pressure was 151/96 mmHg Pulse was 55 bpm Respirations was 16 breaths per minute SPO2 was 98% Temperature was 98.39F There were no new neurological nor musculoskeletal  Findings.  Nursing note and vitals reviewed.         Assessment & Plan:

## 2015-07-22 ENCOUNTER — Encounter: Payer: Self-pay | Admitting: Psychiatry

## 2015-07-22 ENCOUNTER — Ambulatory Visit (INDEPENDENT_AMBULATORY_CARE_PROVIDER_SITE_OTHER): Payer: 59 | Admitting: Psychiatry

## 2015-07-22 VITALS — BP 122/68 | HR 79 | Temp 97.1°F | Ht 61.0 in | Wt 141.0 lb

## 2015-07-22 DIAGNOSIS — F332 Major depressive disorder, recurrent severe without psychotic features: Secondary | ICD-10-CM

## 2015-07-22 DIAGNOSIS — F411 Generalized anxiety disorder: Secondary | ICD-10-CM

## 2015-07-22 MED ORDER — ARIPIPRAZOLE 10 MG PO TABS: 10.0000 mg | ORAL_TABLET | Freq: Every day | ORAL | Status: DC

## 2015-07-22 MED ORDER — FLUOXETINE HCL 20 MG PO CAPS: 60.0000 mg | ORAL_CAPSULE | Freq: Every day | ORAL | Status: DC

## 2015-07-22 MED ORDER — TRAZODONE HCL 50 MG PO TABS: 50.0000 mg | ORAL_TABLET | Freq: Every day | ORAL | Status: DC

## 2015-07-22 MED ORDER — ALPRAZOLAM 0.5 MG PO TABS: ORAL_TABLET | ORAL | Status: DC

## 2015-07-22 NOTE — Progress Notes (Signed)
Patient ID: Deborah Blackwell, female   DOB: 1960/09/08, 55 y.o.   MRN: [id number]  [facility] MD/PA/NP OP Progress Note  07/22/2015 10:41 AM Deborah Blackwell  MRN:  [id number]  Subjective:  Patient returns for follow-up of her major depressive disorder, severe without psychotic features.and generalized anxiety. States she has been able to taper off the Xanax to 0.5mg  twice daily, states she is agreeable to a furthur taper and then stopping completely. Reports trouble sleeping, since husband is home and has residual symptoms from his stroke. States that he is back home but has a speech impediment and some trouble swallowing. States that he also has congestive heart failure and has had 2 strokes so far. She is having financial stressors and states they lost their car. Denies any suicidal thoughts.   Chief Complaint: Stressed about husband's health Chief Complaint    Follow-up; Medication Refill     Visit Diagnosis:     ICD-9-CM ICD-10-CM   1. Major depressive disorder, recurrent, severe without psychotic features ([place]) 296.33 F33.2   2. Generalized anxiety disorder 300.02 F41.1     Past Medical History:  Past Medical History  Diagnosis Date  . History of scoliosis     was casted as a child  . Angina at rest [facility])   . Arthritis   . Asthma   . GERD (gastroesophageal reflux disease)   . COPD (chronic obstructive pulmonary disease) ([place])   . Diabetes mellitus without complication ([place])   . Hypertension   . Hyperlipidemia   . Gout   . RA (rheumatoid arthritis) ([place])   . History of kidney stones   . Migraines   . Depression   . Panic attack   . Bipolar disorder ([place])   . Congenital scoliosis   . DDD (degenerative disc disease), lumbar     Past Surgical History  Procedure Laterality Date  . Abdominal hysterectomy  2003    one ovary remains: due to heavy bleeding/endometrioma/cysts  . Carpal tunnel release    . Back surgery      and injections  . Foot surgery Right     bone spur  . Polypectomy       uterine   Family History:  Family History  Problem Relation Age of Onset  . Alzheimer's disease Mother   . Dementia Mother   . Cancer Mother     colon  . Heart disease Mother   . Hypertension Mother   . Depression Mother   . Alzheimer's disease Father   . Dementia Father   . Hypertension Father   . Cancer Brother     lung  . Lung disease Brother   . Hypertension Sister    Social History:  Social History   Social History  . Marital Status: Married    Spouse Name: N/A  . Number of Children: N/A  . Years of Education: N/A   Social History Main Topics  . Smoking status: Former Smoker -- 2.00 packs/day for 5 years    Types: Cigarettes    Quit date: 02/02/1991  . Smokeless tobacco: Never Used  . Alcohol Use: No  . Drug Use: No  . Sexual Activity: No   Other Topics Concern  . None   Social History Narrative   Additional History:   Assessment:   Musculoskeletal: Strength & Muscle Tone: within normal limits Gait & Station: Walks slowly with a limp Patient leans: N/A  Psychiatric Specialty Exam: HPI  Review of Systems  Musculoskeletal: Positive for joint  pain (in pain management).  Psychiatric/Behavioral: Negative for depression, suicidal ideas, hallucinations, memory loss and substance abuse. The patient is not nervous/anxious and does not have insomnia.   All other systems reviewed and are negative.   Blood pressure 122/68, pulse 79, temperature 97.1 F (36.2 C), temperature source Tympanic, height 5\' 1"  (1.549 m), weight 141 lb (63.957 kg), SpO2 96 %.Body mass index is 26.66 kg/(m^2).  General Appearance: Well Groomed  Eye Contact:  Good  Speech:  Normal Rate  Volume:  Normal  Mood:  okay  Affect:  stressed  Thought Process:  Linear and Logical  Orientation:  Full (Time, Place, and Person)  Thought Content:  Negative  Suicidal Thoughts:  No  Homicidal Thoughts:  No  Memory:  Immediate;   Good Recent;   Good Remote;   Good  Judgement:  Good   Insight:  Good  Psychomotor Activity:  Negative  Concentration:  Good  Recall:  Good  Fund of Knowledge: Good  Language: Good  Akathisia:  Negative  Handed:  Right  AIMS (if indicated):  Done at last visit and was normal   Assets:  Communication Skills Desire for Improvement Social Support  ADL's:  Intact  Cognition: WNL  Sleep:  fair   Is the patient at risk to self?  No. Has the patient been a risk to self in the past 6 months?  No. Has the patient been a risk to self within the distant past?  Yes.   Is the patient a risk to others?  No. Has the patient been a risk to others in the past 6 months?  No. Has the patient been a risk to others within the distant past?  No.  Current Medications: Current Outpatient Prescriptions  Medication Sig Dispense Refill  . albuterol (VENTOLIN HFA) 108 (90 BASE) MCG/ACT inhaler Inhale 2 puffs into the lungs every 6 (six) hours as needed for wheezing or shortness of breath.    . ALPRAZolam (XANAX) 0.5 MG tablet Take 1 tablet by mouth twice daily for one month 60 tablet 0  . amLODipine (NORVASC) 10 MG tablet Take 10 mg by mouth daily. Reported on 03/23/2015    . ARIPiprazole (ABILIFY) 10 MG tablet Take 1 tablet (10 mg total) by mouth daily. 30 tablet 1  . atenolol (TENORMIN) 50 MG tablet Take 1 tablet (50 mg total) by mouth 2 (two) times daily. 180 tablet 1  . atorvastatin (LIPITOR) 40 MG tablet Take 40 mg by mouth at bedtime.    . budesonide-formoterol (SYMBICORT) 160-4.5 MCG/ACT inhaler Inhale 2 puffs into the lungs 2 (two) times daily.    Marland Kitchen FLUoxetine (PROZAC) 20 MG capsule Take 3 capsules (60 mg total) by mouth daily. 90 capsule 1  . furosemide (LASIX) 40 MG tablet Take 40 mg by mouth daily. Reported on 03/23/2015    . hydrOXYzine (VISTARIL) 25 MG capsule Take 1 capsule (25 mg total) by mouth 2 (two) times daily as needed. 30 capsule 0  . levothyroxine (SYNTHROID, LEVOTHROID) 75 MCG tablet Take 1 tablet (75 mcg total) by mouth daily before  breakfast. 5 tablet 0  . losartan (COZAAR) 100 MG tablet Take 1 tablet (100 mg total) by mouth daily. 90 tablet 1  . oxyCODONE (ROXICODONE) 15 MG immediate release tablet Take 1 tablet (15 mg total) by mouth 2 (two) times daily after a meal. 60 tablet 0  . sitaGLIPtin (JANUVIA) 100 MG tablet Take 1 tablet (100 mg total) by mouth daily. 30 tablet 3   No  current facility-administered medications for this visit.    Medical Decision Making:  Established Problem, Stable/Improving (1) and Review of Medication Regimen & Side Effects (2)  Treatment Plan Summary:Medication management and Plan patient has been stable on her regimen without any side effects.   Major depressive disorder, recurrent severe without psychosis. Continue Prozac to 60 mg daily, Abilify 10 mg daily.   Generalized anxieties order- Decrease alprazolam to 0.5 mg once daily for   Insomnia- Start trazodone at 50 mg daily.  Return to clinic in 2 month's time or call before if needed.   Madyson Lukach 07/22/2015, 10:41 AM

## 2015-08-13 ENCOUNTER — Encounter: Payer: Medicare Other | Admitting: Anesthesiology

## 2015-08-16 ENCOUNTER — Other Ambulatory Visit: Payer: Self-pay

## 2015-08-16 NOTE — Telephone Encounter (Signed)
Pt.notified

## 2015-08-16 NOTE — Telephone Encounter (Signed)
Denied Patient cancelled two appts with me in May

## 2015-08-16 NOTE — Telephone Encounter (Signed)
Pt was seeing Dr. Idelia Salm over at [facility] pain clinic and was informed on frriday that he had left she is out of pain medicine and they told her to ask pcp for refill?  Last fill date was 07/16/15 and is completely out.  Oxycodone hcl 15mg  1 tab bid # 60

## 2015-08-20 ENCOUNTER — Encounter: Payer: Medicare Other | Admitting: Anesthesiology

## 2015-09-06 ENCOUNTER — Telehealth: Payer: Self-pay | Admitting: Anesthesiology

## 2015-09-06 NOTE — Telephone Encounter (Signed)
Mrs. Hudley is calling about her referral, she was patient of dr Idelia Salm and wants to see if one of the other phys. Will take her as a patient.

## 2015-09-22 ENCOUNTER — Ambulatory Visit: Payer: Medicare Other | Admitting: Psychiatry

## 2015-09-30 ENCOUNTER — Encounter: Payer: Self-pay | Admitting: Family Medicine

## 2015-09-30 ENCOUNTER — Ambulatory Visit (INDEPENDENT_AMBULATORY_CARE_PROVIDER_SITE_OTHER): Payer: Medicare Other | Admitting: Family Medicine

## 2015-09-30 VITALS — BP 124/64 | HR 95 | Temp 98.1°F | Resp 14 | Wt 131.9 lb

## 2015-09-30 DIAGNOSIS — Z5181 Encounter for therapeutic drug level monitoring: Secondary | ICD-10-CM | POA: Diagnosis not present

## 2015-09-30 DIAGNOSIS — E039 Hypothyroidism, unspecified: Secondary | ICD-10-CM

## 2015-09-30 DIAGNOSIS — E1140 Type 2 diabetes mellitus with diabetic neuropathy, unspecified: Secondary | ICD-10-CM

## 2015-09-30 DIAGNOSIS — E785 Hyperlipidemia, unspecified: Secondary | ICD-10-CM | POA: Diagnosis not present

## 2015-09-30 DIAGNOSIS — M109 Gout, unspecified: Secondary | ICD-10-CM

## 2015-09-30 DIAGNOSIS — F32A Depression, unspecified: Secondary | ICD-10-CM

## 2015-09-30 DIAGNOSIS — F1910 Other psychoactive substance abuse, uncomplicated: Secondary | ICD-10-CM | POA: Diagnosis not present

## 2015-09-30 DIAGNOSIS — F329 Major depressive disorder, single episode, unspecified: Secondary | ICD-10-CM | POA: Diagnosis not present

## 2015-09-30 DIAGNOSIS — Z23 Encounter for immunization: Secondary | ICD-10-CM

## 2015-09-30 DIAGNOSIS — M5137 Other intervertebral disc degeneration, lumbosacral region: Secondary | ICD-10-CM | POA: Diagnosis not present

## 2015-09-30 DIAGNOSIS — Z765 Malingerer [conscious simulation]: Secondary | ICD-10-CM

## 2015-09-30 DIAGNOSIS — R300 Dysuria: Secondary | ICD-10-CM | POA: Diagnosis not present

## 2015-09-30 DIAGNOSIS — N3000 Acute cystitis without hematuria: Secondary | ICD-10-CM | POA: Diagnosis not present

## 2015-09-30 LAB — POCT URINALYSIS DIPSTICK
BILIRUBIN UA: NEGATIVE
GLUCOSE UA: NEGATIVE
Ketones, UA: NEGATIVE
NITRITE UA: NEGATIVE
Protein, UA: NEGATIVE
RBC UA: NEGATIVE
Spec Grav, UA: 1.025
Urobilinogen, UA: 0.2
pH, UA: 5.5

## 2015-09-30 MED ORDER — SULFAMETHOXAZOLE-TRIMETHOPRIM 800-160 MG PO TABS: 1.0000 | ORAL_TABLET | Freq: Two times a day (BID) | ORAL | 0 refills | Status: AC

## 2015-09-30 NOTE — Assessment & Plan Note (Addendum)
Try other modalities for pain; physical therapy, yoga, stretching, turmeric, heat/ice, topical agents; I am not going to prescribe her any narcotics

## 2015-09-30 NOTE — Assessment & Plan Note (Signed)
Check labs 

## 2015-09-30 NOTE — Progress Notes (Signed)
BP 124/64   Pulse 95   Temp 98.1 F (36.7 C) (Oral)   Resp 14   Wt 131 lb 14.4 oz (59.8 kg)   SpO2 97%   BMI 24.92 kg/m    Subjective:    Patient ID: Deborah Blackwell, female    DOB: 1961-01-02, 55 y.o.   MRN: [id number]  HPI: Deborah Blackwell is a 55 y.o. female  Chief Complaint  Patient presents with  . Follow-up  . Urinary Tract Infection    heamatkis,dysuria   She here for follow-up  Type 2 diabetes; running 119 at home; trying to stay away from white bread and sugary drinks  Under significant stress with husband having two "brain strokes" and a heart attack all since May  Her psychiatrist left and her pain clinic doctor left, Dr. Faith Rogue She has not heard anything yet about seeing another psychiatrist He had her on prozac, abilify, and alprazolam I got ready to call the psychiatrist to find out why they didn't get her in to see another provider, and then she did see another doctor there, Dr. Einar Grad, psychiatrist at that practice The abilify was not helpful anyway she says and she does not want to go back Dr. Einar Grad wanted her to go up to 80 mg of Prozac and Dr. Einar Grad did not want to prescribe her any alprazolam  Pain medicine doctor left and she has not been on any medicine since June 28th; she has an appointment with pain clinic on October 2nd; then she says she won't get any medicine for another medicine; she had been on pain medicine since her 20's; she has chronic pain, scoliosis, degenerative disc disease, "you name it"; she saw a back doctor year ago She has taken "everything"; she has been on Vicodin, "oh Lord", tramadol; the Vicodin would help her, "I'd go back to Vicodin" she says, asking for something until she could get back in with the pain clinic She tried cymbalta, Celebrex, none of them helped She has seen the physical therapist, but that apparently back in 2008  High cholesterol; trying to stay away from fatty foods; taking atorvastatin every night; little  bit of stomach upset; fasting  Hypothyroidism; managed here; she has been losing weight; loose stools, had diarrhea for two weeks coming off of pain medicine; stools have evened out  Burning with urination; saw a little blood; symptoms for week and a half; no fevers  She came back to the pain medicine; she says she has been off of pain medicine for two months and asked if I could prescribe her something until she gets in there  Depression screen [facility] 2/9 09/30/2015 07/16/2015 06/18/2015 05/21/2015 04/20/2015  Decreased Interest 0 0 0 0 0  Down, Depressed, Hopeless 1 0 0 0 0  PHQ - 2 Score 1 0 0 0 0   Relevant past medical, surgical, family and social history reviewed Past Medical History:  Diagnosis Date  . Angina at rest [facility])   . Arthritis   . Asthma   . Bipolar disorder ([place])   . Congenital scoliosis   . COPD (chronic obstructive pulmonary disease) ([place])   . DDD (degenerative disc disease), lumbar   . Depression   . Diabetes mellitus without complication ([place])   . GERD (gastroesophageal reflux disease)   . Gout   . History of kidney stones   . History of scoliosis    was casted as a child  . Hyperlipidemia   . Hypertension   .  Migraines   . Panic attack   . RA (rheumatoid arthritis) ([place])    Past Surgical History:  Procedure Laterality Date  . ABDOMINAL HYSTERECTOMY  2003   one ovary remains: due to heavy bleeding/endometrioma/cysts  . BACK SURGERY     and injections  . CARPAL TUNNEL RELEASE    . FOOT SURGERY Right    bone spur  . POLYPECTOMY     uterine   Family History  Problem Relation Age of Onset  . Alzheimer's disease Mother   . Dementia Mother   . Cancer Mother     colon  . Heart disease Mother   . Hypertension Mother   . Depression Mother   . Alzheimer's disease Father   . Dementia Father   . Hypertension Father   . Cancer Brother     lung  . Lung disease Brother   . Hypertension Sister    Social History  Substance Use Topics  . Smoking status:  Former Smoker    Packs/day: 2.00    Years: 5.00    Types: Cigarettes    Quit date: 02/02/1991  . Smokeless tobacco: Never Used  . Alcohol use No   Interim medical history since last visit reviewed. Allergies and medications reviewed  Review of Systems Per HPI unless specifically indicated above     Objective:    BP 124/64   Pulse 95   Temp 98.1 F (36.7 C) (Oral)   Resp 14   Wt 131 lb 14.4 oz (59.8 kg)   SpO2 97%   BMI 24.92 kg/m   Wt Readings from Last 3 Encounters:  09/30/15 131 lb 14.4 oz (59.8 kg)  07/22/15 141 lb (64 kg)  07/16/15 132 lb (59.9 kg)    Physical Exam  Constitutional: She appears well-developed and well-nourished. No distress.  HENT:  Head: Normocephalic and atraumatic.  Eyes: No scleral icterus.  Neck: No JVD present.  Cardiovascular: Normal rate and regular rhythm.   Pulmonary/Chest: Effort normal and breath sounds normal.  Abdominal: Soft. She exhibits no distension.  Neurological: She is alert.  Skin: Skin is warm. She is not diaphoretic. No pallor.  Psychiatric: Her mood appears anxious. Her speech is not rapid and/or pressured. She is not hyperactive. She does not exhibit a depressed mood.  good eye contact with examiner; appeared irritated, upset when learning that I would not prescribe her narcotics or Xanax She is attentive.   Diabetic Foot Form - Detailed   Diabetic Foot Exam - detailed Diabetic Foot exam was performed with the following findings:  Yes 09/30/2015  2:24 PM  Visual Foot Exam completed.:  Yes  Normal Range of Motion:  Yes Pulse Foot Exam completed.:  Yes  Right Dorsalis Pedis:  Present Left Dorsalis Pedis:  Present  Sensory Foot Exam Completed.:  Yes Swelling:  No Semmes-Weinstein Monofilament Test         Assessment & Plan:   Problem List Items Addressed This Visit      Endocrine   Type 2 diabetes, controlled, with neuropathy ([place])    Foot exam by MD today; check A1c and microalbumin:creatinine      Relevant  Orders   Hemoglobin A1c (Completed)   Microalbumin / creatinine urine ratio (Completed)   Hypothyroidism (Chronic)    Check TSH      Relevant Orders   TSH (Completed)   Diabetes mellitus with diabetic neuropathy ([place]) - Primary (Chronic)    Foot exam by MD; check A1c  Musculoskeletal and Integument   DDD (degenerative disc disease), lumbosacral (Chronic)    Try other modalities for pain; physical therapy, yoga, stretching, turmeric, heat/ice, topical agents; I am not going to prescribe her any narcotics        Genitourinary   Acute cystitis    Start antibiotic; hydrate; culture pending      Relevant Orders   Urine Culture (Completed)     Other   Medication monitoring encounter    Check labs      Relevant Orders   CBC with Differential/Platelet (Completed)   COMPLETE METABOLIC PANEL WITH GFR (Completed)   Hyperlipidemia    Check fasting lipids today; continue statin      Relevant Orders   Lipid panel (Completed)   Gout    Check uric acid level today      Relevant Orders   Uric acid (Completed)   Drug-seeking behavior    I explained that I am not going to prescribe narcotics for her pain, and I am not going to prescribe Xanax for her anxiety; she was encouraged to keep her appt with her pain clinic, and was encouraged to go back to see her psychiatrist; no controlled substances prescribed here      Depression    Patient did not desire to return to see her psychiatrist; I gave her a list of counselors in the area and urged her to schedule an appt       Other Visit Diagnoses    Needs flu shot       Relevant Orders   Flu Vaccine QUAD 36+ mos PF IM (Fluarix & Fluzone Quad PF) (Completed)   Dysuria       Relevant Orders   POCT urinalysis dipstick (Completed)   Urine Culture (Completed)       Follow up plan: Return 3-4 weeks, for visit, gout, COPD, anxiety.  An after-visit summary was printed and given to the patient at [place].  Please see the  patient instructions which may contain other information and recommendations beyond what is mentioned above in the assessment and plan.  Meds ordered this encounter  Medications  . sulfamethoxazole-trimethoprim (BACTRIM DS,SEPTRA DS) 800-160 MG tablet    Sig: Take 1 tablet by mouth 2 (two) times daily.    Dispense:  6 tablet    Refill:  0    Orders Placed This Encounter  Procedures  . Urine Culture  . Flu Vaccine QUAD 36+ mos PF IM (Fluarix & Fluzone Quad PF)  . CBC with Differential/Platelet  . Lipid panel  . Hemoglobin A1c  . Microalbumin / creatinine urine ratio  . TSH  . Uric acid  . COMPLETE METABOLIC PANEL WITH GFR  . POCT urinalysis dipstick

## 2015-09-30 NOTE — Assessment & Plan Note (Signed)
Patient did not desire to return to see her psychiatrist; I gave her a list of counselors in the area and urged her to schedule an appt

## 2015-09-30 NOTE — Assessment & Plan Note (Signed)
Check TSH 

## 2015-09-30 NOTE — Assessment & Plan Note (Signed)
Check fasting lipids today; continue statin 

## 2015-09-30 NOTE — Assessment & Plan Note (Addendum)
Start antibiotic; hydrate; culture pending

## 2015-09-30 NOTE — Assessment & Plan Note (Signed)
Check uric acid level today 

## 2015-09-30 NOTE — Assessment & Plan Note (Signed)
Foot exam by MD today; check A1c and microalbumin:creatinine

## 2015-09-30 NOTE — Patient Instructions (Addendum)
You are welcome to call any pain clinic you'd like and if they need a referral from [place], let us know I'll encourage you to consider non-narcotic options such as non-steroidals, tylenol, turmeric, topicals, head or ice, massage therapy, chiropractic therapy, acupuncture Return for discussion and follow-up of other health issues in 3-4 weeks Your goal blood pressure is less than 130 mmHg on top. Try to follow the DASH guidelines (DASH stands for Dietary Approaches to Stop Hypertension) Try to limit the sodium in your diet.  Ideally, consume less than 1.5 grams (less than 1,500mg ) per day. Do not add salt when cooking or at the table.  Check the sodium amount on labels when shopping, and choose items lower in sodium when given a choice. Avoid or limit foods that already contain a lot of sodium. Eat a diet rich in fruits and vegetables and whole grains. Try to limit saturated fats in your diet (bologna, hot dogs, barbeque, cheeseburgers, hamburgers, steak, bacon, sausage, cheese, etc.) and get more fresh fruits, vegetables, and whole grains Please do schedule an appointment to start working with a therapist (see list)

## 2015-09-30 NOTE — Assessment & Plan Note (Signed)
Foot exam by MD; check A1c 

## 2015-10-01 ENCOUNTER — Encounter: Payer: Self-pay | Admitting: Family Medicine

## 2015-10-01 LAB — COMPLETE METABOLIC PANEL WITH GFR
ALBUMIN: 3.8 g/dL (ref 3.6–5.1)
ALK PHOS: 69 U/L (ref 33–130)
ALT: 11 U/L (ref 6–29)
AST: 16 U/L (ref 10–35)
BILIRUBIN TOTAL: 0.8 mg/dL (ref 0.2–1.2)
BUN: 9 mg/dL (ref 7–25)
CALCIUM: 9.1 mg/dL (ref 8.6–10.4)
CO2: 28 mmol/L (ref 20–31)
CREATININE: 0.78 mg/dL (ref 0.50–1.05)
Chloride: 104 mmol/L (ref 98–110)
GFR, Est Non African American: 85 mL/min (ref >=60)
Glucose, Bld: 84 mg/dL (ref 65–99)
Potassium: 4.3 mmol/L (ref 3.5–5.3)
Sodium: 142 mmol/L (ref 135–146)
TOTAL PROTEIN: 6.7 g/dL (ref 6.1–8.1)

## 2015-10-01 LAB — LIPID PANEL
Cholesterol: 175 mg/dL (ref 125–200)
HDL: 37 mg/dL — ABNORMAL LOW (ref >=46)
LDL CALC: 120 mg/dL (ref <130)
Total CHOL/HDL Ratio: 4.7 Ratio (ref <=5.0)
Triglycerides: 88 mg/dL (ref <150)
VLDL: 18 mg/dL (ref <30)

## 2015-10-01 LAB — CBC WITH DIFFERENTIAL/PLATELET
BASOS PCT: 0 %
Basophils Absolute: 0 cells/uL (ref 0–200)
EOS PCT: 2 %
Eosinophils Absolute: 164 cells/uL (ref 15–500)
HCT: 44.3 % (ref 35.0–45.0)
Hemoglobin: 14.7 g/dL (ref 11.7–15.5)
LYMPHS ABS: 2952 {cells}/uL (ref 850–3900)
Lymphocytes Relative: 36 %
MCH: 29.4 pg (ref 27.0–33.0)
MCHC: 33.2 g/dL (ref 32.0–36.0)
MCV: 88.6 fL (ref 80.0–100.0)
MONOS PCT: 9 %
MPV: 10.4 fL (ref 7.5–12.5)
Monocytes Absolute: 738 cells/uL (ref 200–950)
NEUTROS ABS: 4346 {cells}/uL (ref 1500–7800)
Neutrophils Relative %: 53 %
PLATELETS: 300 10*3/uL (ref 140–400)
RBC: 5 MIL/uL (ref 3.80–5.10)
RDW: 14.1 % (ref 11.0–15.0)
WBC: 8.2 10*3/uL (ref 3.8–10.8)

## 2015-10-01 LAB — HEMOGLOBIN A1C
HEMOGLOBIN A1C: 5.5 % (ref <5.7)
MEAN PLASMA GLUCOSE: 111 mg/dL

## 2015-10-01 LAB — MICROALBUMIN / CREATININE URINE RATIO
CREATININE, URINE: 258 mg/dL (ref 20–320)
MICROALB UR: 1 mg/dL
Microalb Creat Ratio: 4 mcg/mg creat (ref <30)

## 2015-10-01 LAB — TSH: TSH: 1.59 mIU/L

## 2015-10-01 LAB — URIC ACID: URIC ACID, SERUM: 3.8 mg/dL (ref 2.5–7.0)

## 2015-10-01 NOTE — Progress Notes (Signed)
Letter re: labs 

## 2015-10-02 LAB — URINE CULTURE: ORGANISM ID, BACTERIA: NO GROWTH

## 2015-10-04 ENCOUNTER — Other Ambulatory Visit: Payer: Self-pay

## 2015-10-04 ENCOUNTER — Other Ambulatory Visit: Payer: Self-pay | Admitting: Family Medicine

## 2015-10-04 DIAGNOSIS — E039 Hypothyroidism, unspecified: Secondary | ICD-10-CM

## 2015-10-04 DIAGNOSIS — R300 Dysuria: Secondary | ICD-10-CM

## 2015-10-04 DIAGNOSIS — I10 Essential (primary) hypertension: Secondary | ICD-10-CM

## 2015-10-04 NOTE — Progress Notes (Signed)
Refer to urologist 

## 2015-10-05 ENCOUNTER — Telehealth: Payer: Self-pay | Admitting: Family Medicine

## 2015-10-05 ENCOUNTER — Other Ambulatory Visit: Payer: Self-pay

## 2015-10-05 DIAGNOSIS — I10 Essential (primary) hypertension: Secondary | ICD-10-CM

## 2015-10-05 DIAGNOSIS — E039 Hypothyroidism, unspecified: Secondary | ICD-10-CM

## 2015-10-05 MED ORDER — ATENOLOL 50 MG PO TABS: 50.0000 mg | ORAL_TABLET | Freq: Two times a day (BID) | ORAL | 1 refills | Status: DC

## 2015-10-05 MED ORDER — LEVOTHYROXINE SODIUM 75 MCG PO TABS: 75.0000 ug | ORAL_TABLET | Freq: Every day | ORAL | 1 refills | Status: DC

## 2015-10-05 MED ORDER — LOSARTAN POTASSIUM 100 MG PO TABS: 100.0000 mg | ORAL_TABLET | Freq: Every day | ORAL | 1 refills | Status: DC

## 2015-10-05 NOTE — Telephone Encounter (Signed)
Last labs reviewed; Rxs approved 

## 2015-10-05 NOTE — Telephone Encounter (Signed)
Pt is completely out

## 2015-10-06 DIAGNOSIS — M5030 Other cervical disc degeneration, unspecified cervical region: Secondary | ICD-10-CM | POA: Diagnosis not present

## 2015-10-06 DIAGNOSIS — Z765 Malingerer [conscious simulation]: Secondary | ICD-10-CM | POA: Insufficient documentation

## 2015-10-06 DIAGNOSIS — M5137 Other intervertebral disc degeneration, lumbosacral region: Secondary | ICD-10-CM | POA: Diagnosis not present

## 2015-10-06 NOTE — Assessment & Plan Note (Signed)
I explained that I am not going to prescribe narcotics for her pain, and I am not going to prescribe Xanax for her anxiety; she was encouraged to keep her appt with her pain clinic, and was encouraged to go back to see her psychiatrist; no controlled substances prescribed here

## 2015-10-18 ENCOUNTER — Ambulatory Visit: Payer: Medicare Other | Attending: Pain Medicine | Admitting: Pain Medicine

## 2015-10-18 ENCOUNTER — Encounter: Payer: Self-pay | Admitting: Pain Medicine

## 2015-10-18 VITALS — BP 136/77 | HR 72 | Temp 98.6°F | Ht 61.0 in | Wt 130.0 lb

## 2015-10-18 DIAGNOSIS — M5030 Other cervical disc degeneration, unspecified cervical region: Secondary | ICD-10-CM

## 2015-10-18 DIAGNOSIS — M545 Low back pain: Secondary | ICD-10-CM

## 2015-10-18 DIAGNOSIS — M25511 Pain in right shoulder: Secondary | ICD-10-CM

## 2015-10-18 DIAGNOSIS — Q675 Congenital deformity of spine: Secondary | ICD-10-CM | POA: Diagnosis not present

## 2015-10-18 DIAGNOSIS — M5137 Other intervertebral disc degeneration, lumbosacral region: Secondary | ICD-10-CM

## 2015-10-18 DIAGNOSIS — G8929 Other chronic pain: Secondary | ICD-10-CM

## 2015-10-18 DIAGNOSIS — M069 Rheumatoid arthritis, unspecified: Secondary | ICD-10-CM | POA: Diagnosis not present

## 2015-10-18 DIAGNOSIS — M1288 Other specific arthropathies, not elsewhere classified, other specified site: Secondary | ICD-10-CM

## 2015-10-18 DIAGNOSIS — E1140 Type 2 diabetes mellitus with diabetic neuropathy, unspecified: Secondary | ICD-10-CM | POA: Insufficient documentation

## 2015-10-18 DIAGNOSIS — M47816 Spondylosis without myelopathy or radiculopathy, lumbar region: Secondary | ICD-10-CM

## 2015-10-18 DIAGNOSIS — Z886 Allergy status to analgesic agent status: Secondary | ICD-10-CM | POA: Diagnosis not present

## 2015-10-18 DIAGNOSIS — G894 Chronic pain syndrome: Secondary | ICD-10-CM | POA: Diagnosis not present

## 2015-10-18 DIAGNOSIS — F319 Bipolar disorder, unspecified: Secondary | ICD-10-CM | POA: Insufficient documentation

## 2015-10-18 DIAGNOSIS — Z91041 Radiographic dye allergy status: Secondary | ICD-10-CM | POA: Diagnosis not present

## 2015-10-18 DIAGNOSIS — M5481 Occipital neuralgia: Secondary | ICD-10-CM

## 2015-10-18 DIAGNOSIS — M8580 Other specified disorders of bone density and structure, unspecified site: Secondary | ICD-10-CM | POA: Diagnosis not present

## 2015-10-18 DIAGNOSIS — F1190 Opioid use, unspecified, uncomplicated: Secondary | ICD-10-CM | POA: Diagnosis not present

## 2015-10-18 DIAGNOSIS — M2550 Pain in unspecified joint: Secondary | ICD-10-CM

## 2015-10-18 DIAGNOSIS — M5010 Cervical disc disorder with radiculopathy, unspecified cervical region: Secondary | ICD-10-CM | POA: Insufficient documentation

## 2015-10-18 DIAGNOSIS — M109 Gout, unspecified: Secondary | ICD-10-CM | POA: Diagnosis not present

## 2015-10-18 DIAGNOSIS — I10 Essential (primary) hypertension: Secondary | ICD-10-CM | POA: Insufficient documentation

## 2015-10-18 DIAGNOSIS — J449 Chronic obstructive pulmonary disease, unspecified: Secondary | ICD-10-CM | POA: Diagnosis not present

## 2015-10-18 DIAGNOSIS — M79604 Pain in right leg: Secondary | ICD-10-CM

## 2015-10-18 DIAGNOSIS — Z79891 Long term (current) use of opiate analgesic: Secondary | ICD-10-CM

## 2015-10-18 DIAGNOSIS — E039 Hypothyroidism, unspecified: Secondary | ICD-10-CM | POA: Insufficient documentation

## 2015-10-18 DIAGNOSIS — M542 Cervicalgia: Secondary | ICD-10-CM

## 2015-10-18 DIAGNOSIS — M5116 Intervertebral disc disorders with radiculopathy, lumbar region: Secondary | ICD-10-CM | POA: Diagnosis not present

## 2015-10-18 DIAGNOSIS — M47896 Other spondylosis, lumbar region: Secondary | ICD-10-CM

## 2015-10-18 DIAGNOSIS — Z87891 Personal history of nicotine dependence: Secondary | ICD-10-CM | POA: Insufficient documentation

## 2015-10-18 DIAGNOSIS — E785 Hyperlipidemia, unspecified: Secondary | ICD-10-CM | POA: Insufficient documentation

## 2015-10-18 DIAGNOSIS — R350 Frequency of micturition: Secondary | ICD-10-CM | POA: Diagnosis not present

## 2015-10-18 DIAGNOSIS — K219 Gastro-esophageal reflux disease without esophagitis: Secondary | ICD-10-CM | POA: Insufficient documentation

## 2015-10-18 DIAGNOSIS — M4726 Other spondylosis with radiculopathy, lumbar region: Secondary | ICD-10-CM | POA: Insufficient documentation

## 2015-10-18 DIAGNOSIS — M5441 Lumbago with sciatica, right side: Secondary | ICD-10-CM

## 2015-10-18 DIAGNOSIS — R51 Headache: Secondary | ICD-10-CM

## 2015-10-18 DIAGNOSIS — G4486 Cervicogenic headache: Secondary | ICD-10-CM

## 2015-10-18 NOTE — Progress Notes (Signed)
Safety precautions to be maintained throughout the outpatient stay will include: orient to surroundings, keep bed in low position, maintain call bell within reach at all times, provide assistance with transfer out of bed and ambulation.  

## 2015-10-18 NOTE — Progress Notes (Signed)
Patient's Name: Deborah Blackwell  MRN: [id number]  Referring Provider: Lance Bosch, MD  DOB: [birth year]  PCP: Enid Derry, MD  DOS: 10/18/2015  Note by: Kathlen Brunswick. Dossie Arbour, MD  Service setting: Ambulatory outpatient  Specialty: Interventional Pain Management  Location: [facility] (AMB) Pain Management Facility    Patient type: New patient   Primary Reason(s) for Visit: Initial Patient Evaluation CC: Back Pain (lower); Neck Pain; and Shoulder Pain  HPI  Ms. Deborah Blackwell is a 55 y.o. year old, female patient, who comes today for an initial evaluation. She has COPD (chronic obstructive pulmonary disease) ([place]); Diabetes mellitus with diabetic neuropathy ([place]); Hypertension; Gout; Hyperlipidemia; Hypothyroidism; Congenital scoliosis; Encounter for therapeutic drug level monitoring; Long term prescription opiate use; Urinary frequency; Type 2 diabetes, controlled, with neuropathy ([place]); Depression; Chronic neck pain (Location of Tertiary source of pain) (Right); Cervical disc disorder with radiculopathy of cervical region; Acute cystitis; Drug-seeking behavior; Long term current use of opiate analgesic; Opiate use; Encounter for chronic pain management; Chronic shoulder pain (Right); Chronic low back pain (Location of Primary Source of Pain) (Right); Chronic lower extremity pain (Location of Secondary source of pain) (Right); Chronic pain; Occipital neuralgia (Right); Cervicogenic headache; Arthralgia; Lumbar facet hypertrophy (Bilateral); Lumbar facet syndrome (Location of Primary Source of Pain) (Right); Lumbar spondylosis; and Osteopenia on her problem list.. Her primarily concern today is the Back Pain (lower); Neck Pain; and Shoulder Pain  Pain Assessment: Self-Reported Pain Score: 6  (pain scale information given)/10 Clinically the patient looks like a 2/10 Reported level is inconsistent with clinical observations. Information on the proper use of the pain score provided to the patient today. Pain Type: Chronic  pain Pain Location: Back (right shoulder, neck) Pain Orientation: Lower, Upper Pain Descriptors / Indicators: Sharp, Throbbing Pain Frequency: Constant  Onset and Duration: Sudden and Date of onset: Started over 30 years ago. Cause of pain: The patient believes that it is secondary to the scoliosis that she was born with. Severity: No change since onset, NAS-11 at its worse: 8/10, NAS-11 at its best: 4/10, NAS-11 now: 8/10 and NAS-11 on the average: 4/10 Timing: Not influenced by the time of the day Aggravating Factors: Bending, Climbing, Kneeling, Lifiting, Prolonged sitting, Prolonged standing, Squatting, Stooping , Twisting, Walking, Walking uphill and Working Alleviating Factors: Medications Associated Problems: Fatigue, Pain that wakes patient up and Pain that does not allow patient to sleep Quality of Pain: Constant, Horrible, Sharp, Shooting, Tender and Tingling Previous Examinations or Tests: Bone scan, CT scan, MRI scan, Nerve block, Orthoperdic evaluation, Chiropractic evaluation and Psychiatric evaluation Previous Treatments: Chiropractic manipulations, Epidural steroid injections, Narcotic medications, Physical Therapy, Pool exercises, Relaxation therapy, Steroid treatments by mouth, Strengthening exercises, Stretching exercises, TENS, Traction and Trigger point injections  The patient comes into the clinics today for the first time for a chronic pain management evaluation. This is an ex-patient of Dr. Idelia Salm. The patient indicates her primary pain to be that of the lower back on the right side. His her secondary pain is on her right lower extremity going down to the area of the calf through the posterior aspect of the leg in what seems to be a referred pain pattern. Next is her right posterior neck pain followed by her right occipital headache and her right shoulder pain. The patient does have a prior history of mild lumbar scoliosis (dextroscoliosis).  Today I took the time to  provide the patient with information regarding my pain practice. The patient was informed that my practice is divided into  two sections: an interventional pain management section, as well as a completely separate and distinct medication management section. The interventional portion of my practice takes place on Tuesdays and Thursdays, while the medication management is conducted on Mondays and Wednesdays. Because of the amount of documentation required on both them, they are kept separated. This means that there is the possibility that the patient may be scheduled for a procedure on Tuesday, while also having a medication management appointment on Wednesday. I have also informed the patient that because of current staffing and facility limitations, I no longer take patients for medication management only. To illustrate the reasons for this, I gave the patient the example of a surgeon and how inappropriate it would be to refer a patient to his/her practice so that they write for the post-procedure antibiotics on a surgery done by someone else.   The patient was informed that joining my practice means that they are open to any and all interventional therapies. I clarified for the patient that this does not mean that they will be forced to have any procedures done. What it means is that patients looking for a practitioner to simply write for their pain medications and not take advantage of other interventional techniques will be better served by a different practitioner, other than myself. I made it clear that I prefer to spend my time providing those services that I specialize in.  The patient was also made aware of my Comprehensive Pain Management Safety Guidelines where by joining my practice, they limit all of their nerve blocks and joint injections to those done by our practice, for as long as we are retained to manage their care.   Historic Controlled Substance Pharmacotherapy Review  Previously  Prescribed Controlled Substances: Alprazolam 0.5 mg 3 times a day; Vicodin ES 7.5/752 tablets by mouth every 4 hours (90 mg/day of hydrocodone) (360 tablets per month); hydrocodone/APAP 7.5/300; alprazolam 1 mg 3 times a day; OxyContin 40 mg 1 tablet by mouth twice a day; oxycodone IR 30 mg 4 times a day. Highest historical Analgesic Regimen: Oxycodone IR 30 mg every 6 hours (180 MME/Day) + alprazolam 1 mg 3 times a day. Currently Prescribed Analgesic: Oxycodone 15 mg twice a day from Dr. Idelia Salm, last filled on 07/16/2015. Medications: The patient did not bring the medication(s) to the appointment, as requested in our "New Patient Package" MME/day: 45 mg/day Pharmacodynamics: Analgesic Effect: More than 50% Activity Facilitation: Medication(s) allow patient to sit, stand, walk, and do the basic ADLs Perceived Effectiveness: Described as relatively effective, allowing for increase in activities of daily living (ADL) Side-effects or Adverse reactions: None reported Historical Background Evaluation: [place] PDMP: Five (5) year initial data search conducted. No abnormal patterns identified [place] [employer] Offender Public Information: Non-contributory UDS Results: No UDS results available at this time UDS Interpretation: N/A Medication Assessment Form: Not applicable. Initial evaluation. The patient has not received any medications from our practice Treatment compliance: Not applicable. Initial evaluation Risk Assessment: Aberrant Behavior: None observed or detected today Opioid Fatal Overdose Risk Factors: High daily dosage Non-fatal overdose hazard ratio (HR): Calculation deferred Fatal overdose hazard ratio (HR): Calculation deferred Substance Use Disorder (SUD) Risk Level: Pending results of Medical Psychology Evaluation for SUD Opioid Risk Tool (ORT) Score: Total Score: 0 Low Risk for SUD (Score <3) Depression Scale Score: PHQ-2: PHQ-2 Total Score: 0 No depression (0) PHQ-9: PHQ-9  Total Score: 0 No depression (0-4)  Pharmacologic Plan: Pending ordered tests and/or consults  Historical  Illicit Drug Screen Labs(s): No results found for: MDMA, COCAINSCRNUR, PCPSCRNUR, THCU, ETH  Meds  The patient has a current medication list which includes the following prescription(s): albuterol, amlodipine, atenolol, atorvastatin, budesonide-formoterol, levothyroxine, and losartan.  Current Outpatient Prescriptions on File Prior to Visit  Medication Sig  . albuterol (VENTOLIN HFA) 108 (90 BASE) MCG/ACT inhaler Inhale 2 puffs into the lungs every 6 (six) hours as needed for wheezing or shortness of breath.  Marland Kitchen amLODipine (NORVASC) 10 MG tablet Take 10 mg by mouth daily. Reported on 03/23/2015  . atenolol (TENORMIN) 50 MG tablet Take 1 tablet (50 mg total) by mouth 2 (two) times daily.  Marland Kitchen atorvastatin (LIPITOR) 40 MG tablet Take 40 mg by mouth at bedtime.  . budesonide-formoterol (SYMBICORT) 160-4.5 MCG/ACT inhaler Inhale 2 puffs into the lungs 2 (two) times daily.  Marland Kitchen levothyroxine (SYNTHROID, LEVOTHROID) 75 MCG tablet Take 1 tablet (75 mcg total) by mouth daily before breakfast.  . losartan (COZAAR) 100 MG tablet Take 1 tablet (100 mg total) by mouth daily.   No current facility-administered medications on file prior to visit.    Imaging Review  Lumbosacral Imaging: Lumbar MR wo contrast:  Results for orders placed during the hospital encounter of 10/30/14  MR Lumbar Spine Wo Contrast   Narrative CLINICAL DATA:  Low back pain  EXAM: MRI LUMBAR SPINE WITHOUT CONTRAST  TECHNIQUE: Multiplanar, multisequence MR imaging of the lumbar spine was performed. No intravenous contrast was administered.  COMPARISON:  None.  FINDINGS: Normal lumbar alignment. Negative for fracture or mass lesion. Conus medullaris is normal and terminates at L1-2.  T11-12: Small central disc protrusion  T12-L1:  Negative  L1-2:  Negative  L2-3:  Negative  L3-4: Mild disc degeneration and mild  facet degeneration without significant spinal or foraminal stenosis  L4-5: Mild disc degeneration. Small central disc protrusion without spinal or foraminal stenosis. Bilateral facet hypertrophy.  L5-S1: Small central disc protrusion. Bilateral facet hypertrophy. No significant foraminal stenosis.  IMPRESSION: Mild lumbar degenerative changes with small central disc protrusions at L4-5 and L5-S1. No significant neural impingement.   Electronically Signed   By: Franchot Gallo M.D.   On: 10/30/2014 14:31    Note: Imaging results reviewed.  ROS  Cardiovascular History: Hypertension and Heart catheterization Pulmonary or Respiratory History: Asthma Neurological History: Scoliosis Review of Past Neurological Studies: No results found for this or any previous visit. Psychological-Psychiatric History: Anxiety and Panic Attacks Gastrointestinal History: Negative for peptic ulcer disease, hiatal hernia, GERD, IBS, hepatitis, cirrhosis or pancreatitis Genitourinary History: Nephrolithiasis Hematological History: Negative for anticoagulant therapy, anemia, bruising or bleeding easily, hemophilia, sickle cell disease or trait, thrombocytopenia or coagulupathies Endocrine History: Non-insulin-dependent diabetes mellitus and Hypothyroidism Rheumatologic History: Negative for lupus, osteoarthritis, rheumatoid arthritis, myositis, polymyositis or fibromyagia Musculoskeletal History: Negative for myasthenia gravis, muscular dystrophy, multiple sclerosis or malignant hyperthermia Work History: Disabled since 08/29/2012 due to back problems and anxiety.  Allergies  Ms. Vassey is allergic to contrast media [iodinated diagnostic agents]; aspirin; and macrobid [nitrofurantoin monohyd macro].  Laboratory Chemistry  Inflammation Markers Lab Results  Component Value Date   ESRSEDRATE 27 10/20/2015   CRP 1.6 (H) 10/20/2015   Renal Function Lab Results  Component Value Date   BUN 11 10/20/2015    CREATININE 0.77 10/20/2015   GFRAA >60 10/20/2015   GFRNONAA >60 10/20/2015   Hepatic Function Lab Results  Component Value Date   AST 21 10/20/2015   ALT 16 10/20/2015   ALBUMIN 4.0 10/20/2015   Electrolytes Lab Results  Component Value Date   NA 141 10/20/2015   K 3.3 (L) 10/20/2015   CL 107 10/20/2015   CALCIUM 9.3 10/20/2015   MG 2.0 10/20/2015   Pain Modulating Vitamins Lab Results  Component Value Date   VITAMINB12 515 10/20/2015   Coagulation Parameters Lab Results  Component Value Date   PLT 300 09/30/2015   Cardiovascular Lab Results  Component Value Date   HGB 14.7 09/30/2015   HCT 44.3 09/30/2015    Note: Lab results reviewed.  [place]va  Medical:  Ms. Pakkala  has a past medical history of Angina at rest [facility]); Arthritis; Asthma; Back pain with right-sided sciatica (09/30/2014); Bipolar disorder ([place]); Congenital scoliosis; COPD (chronic obstructive pulmonary disease) ([place]); DDD (degenerative disc disease), lumbar; Depression; Diabetes mellitus without complication ([place]); GERD (gastroesophageal reflux disease); Gout; History of kidney stones; History of scoliosis; Hyperlipidemia; Hypertension; Lumbar radiculopathy (09/30/2014); Migraines; Panic attack; and RA (rheumatoid arthritis) ([place]). Family: family history includes Alzheimer's disease in her father and mother; Cancer in her brother and mother; Dementia in her father and mother; Depression in her mother; Heart disease in her mother; Hypertension in her father, mother, and sister; Lung disease in her brother. Surgical:  has a past surgical history that includes Abdominal hysterectomy (2003); Carpal tunnel release; Back surgery; Foot surgery (Right); and Polypectomy. Tobacco:  reports that she quit smoking about 24 years ago. Her smoking use included Cigarettes. She has a 10.00 pack-year smoking history. She has never used smokeless tobacco. Alcohol:  reports that she does not drink alcohol. Drug:  reports that she  does not use drugs. Active Ambulatory Problems    Diagnosis Date Noted  . COPD (chronic obstructive pulmonary disease) ([place])   . Diabetes mellitus with diabetic neuropathy ([place])   . Hypertension   . Gout 08/07/2014  . Hyperlipidemia 08/07/2014  . Hypothyroidism 08/07/2014  . Congenital scoliosis 08/07/2014  . Encounter for therapeutic drug level monitoring 08/07/2014  . Long term prescription opiate use 08/11/2014  . Urinary frequency 08/07/2014  . Type 2 diabetes, controlled, with neuropathy ([place]) 09/30/2014  . Depression 09/30/2014  . Chronic neck pain (Location of Tertiary source of pain) (Right) 09/30/2014  . Cervical disc disorder with radiculopathy of cervical region 09/30/2014  . Acute cystitis 09/30/2015  . Drug-seeking behavior 10/06/2015  . Long term current use of opiate analgesic 10/18/2015  . Opiate use 10/18/2015  . Encounter for chronic pain management 10/18/2015  . Chronic shoulder pain (Right) 10/18/2015  . Chronic low back pain (Location of Primary Source of Pain) (Right) 10/18/2015  . Chronic lower extremity pain (Location of Secondary source of pain) (Right) 10/18/2015  . Chronic pain 10/18/2015  . Occipital neuralgia (Right) 10/18/2015  . Cervicogenic headache 10/18/2015  . Arthralgia 10/20/2015  . Lumbar facet hypertrophy (Bilateral) 10/20/2015  . Lumbar facet syndrome (Location of Primary Source of Pain) (Right) 10/20/2015  . Lumbar spondylosis 10/20/2015  . Osteopenia 10/20/2015   Resolved Ambulatory Problems    Diagnosis Date Noted  . Back pain with right-sided sciatica 09/30/2014  . Lumbar radiculopathy 09/30/2014   Past Medical History:  Diagnosis Date  . Angina at rest [facility])   . Arthritis   . Asthma   . Back pain with right-sided sciatica 09/30/2014  . Bipolar disorder ([place])   . Congenital scoliosis   . COPD (chronic obstructive pulmonary disease) ([place])   . DDD (degenerative disc disease), lumbar   . Depression   . Diabetes mellitus without  complication ([place])   . GERD (gastroesophageal reflux disease)   .  Gout   . History of kidney stones   . History of scoliosis   . Hyperlipidemia   . Hypertension   . Lumbar radiculopathy 09/30/2014  . Migraines   . Panic attack   . RA (rheumatoid arthritis) ([place])     Constitutional Exam  General appearance: Well nourished, well developed, and well hydrated. In no acute distress Vitals:   10/18/15 1416  BP: 136/77  Pulse: 72  Temp: 98.6 F (37 C)  SpO2: 100%  Weight: 130 lb (59 kg)  Height: 5\' 1"  (1.549 m)  BMI Assessment: Estimated body mass index is 24.56 kg/m as calculated from the following:   Height as of this encounter: 5\' 1"  (1.549 m).   Weight as of this encounter: 130 lb (59 kg).   BMI interpretation: (18.5-24.9 kg/m2) = Ideal body weight BMI Readings from Last 4 Encounters:  10/18/15 24.56 kg/m  09/30/15 24.92 kg/m  07/22/15 26.64 kg/m  07/16/15 24.94 kg/m   Wt Readings from Last 4 Encounters:  10/18/15 130 lb (59 kg)  09/30/15 131 lb 14.4 oz (59.8 kg)  07/22/15 141 lb (64 kg)  07/16/15 132 lb (59.9 kg)  Psych/Mental status: Alert and oriented x 3 (person, place, & time) Eyes: PERLA Respiratory: No evidence of acute respiratory distress  Cervical Spine Exam  Inspection: No masses, redness, or swelling Alignment: Symmetrical Functional ROM: Unrestricted ROM Stability: No instability detected Muscle strength & Tone: Functionally intact Sensory: Unimpaired Palpation: Non-contributory  Upper Extremity (UE) Exam    Side: Right upper extremity  Side: Left upper extremity  Inspection: No masses, redness, swelling, or asymmetry  Inspection: No masses, redness, swelling, or asymmetry  Functional ROM: Unrestricted ROM         Functional ROM: Unrestricted ROM          Muscle strength & Tone: Functionally intact  Muscle strength & Tone: Functionally intact  Sensory: Unimpaired  Sensory: Unimpaired  Palpation: Non-contributory  Palpation: Non-contributory    Thoracic Spine Exam  Inspection: No masses, redness, or swelling Alignment: Symmetrical Functional ROM: Unrestricted ROM Stability: No instability detected Sensory: Unimpaired Muscle strength & Tone: Functionally intact Palpation: Non-contributory  Lumbar Spine Exam  Inspection: No masses, redness, or swelling Alignment: Symmetrical Functional ROM: Unrestricted ROM Stability: No instability detected Muscle strength & Tone: Functionally intact Sensory: Unimpaired Palpation: Non-contributory Provocative Tests: Lumbar Hyperextension and rotation test: evaluation deferred today       Patrick's Maneuver: evaluation deferred today              Gait & Posture Assessment  Ambulation: Patient ambulates using a walker Gait: Limited. Using assistive device to ambulate Posture: Poor   Lower Extremity Exam    Side: Right lower extremity  Side: Left lower extremity  Inspection: No masses, redness, swelling, or asymmetry  Inspection: No masses, redness, swelling, or asymmetry  Functional ROM: Unrestricted ROM          Functional ROM: Unrestricted ROM          Muscle strength & Tone: Deconditioned  Muscle strength & Tone: Deconditioned  Sensory: Unimpaired  Sensory: Unimpaired  Palpation: Non-contributory  Palpation: Non-contributory    Assessment  Primary Diagnosis & Pertinent Problem List: The primary encounter diagnosis was Chronic pain syndrome. Diagnoses of Long term current use of opiate analgesic, Long term prescription opiate use, Opiate use, Encounter for chronic pain management, Chronic right shoulder pain, Chronic low back pain without sciatica, unspecified back pain laterality, Chronic pain of right lower extremity, Chronic neck pain, DDD (degenerative  disc disease), cervical, DDD (degenerative disc disease), lumbosacral, Occipital neuralgia of right side, Cervicogenic headache, Arthralgia, unspecified joint, Lumbar facet hypertrophy (Bilateral), Lumbar facet syndrome (Location  of Primary Source of Pain) (Right), Lumbar spondylosis, Congenital scoliosis, and Osteopenia, unspecified location were also pertinent to this visit.  Visit Diagnosis: 1. Chronic pain syndrome   2. Long term current use of opiate analgesic   3. Long term prescription opiate use   4. Opiate use   5. Encounter for chronic pain management   6. Chronic right shoulder pain   7. Chronic low back pain without sciatica, unspecified back pain laterality   8. Chronic pain of right lower extremity   9. Chronic neck pain   10. DDD (degenerative disc disease), cervical   11. DDD (degenerative disc disease), lumbosacral   12. Occipital neuralgia of right side   13. Cervicogenic headache   14. Arthralgia, unspecified joint   15. Lumbar facet hypertrophy (Bilateral)   16. Lumbar facet syndrome (Location of Primary Source of Pain) (Right)   17. Lumbar spondylosis   18. Congenital scoliosis   19. Osteopenia, unspecified location    Plan of Care  Initial Treatment Plan:  Please be advised that as per protocol, today's visit has been an evaluation only. We have not taken over the patient's controlled substance management.  Problem-Specific Plan: No problem-specific Assessment & Plan notes found for this encounter.  Ordered Lab-work, Procedure(s), & Referral(s) : Orders Placed This Encounter  Procedures  . DG Cervical Spine Complete  . DG Lumbar Spine Complete W/Bend  . DG Shoulder Right  . Compliance Drug Analysis, Ur  . Comprehensive metabolic panel  . C-reactive protein  . Magnesium  . Sedimentation rate  . Vitamin B12  . 25-Hydroxyvitamin D Lcms D2+D3  . Ambulatory referral to Psychology  Referral(s) or Consult(s): Medical psychology consult for substance use disorder evaluation Pharmacotherapy: Medications ordered:  No orders of the defined types were placed in this encounter.  Medications administered during this visit: Ms. Kellar had no medications administered during this visit.    Pharmacotherapy plan under consideration:  The patient was informed that there is no guarantee that she would be a candidate for opioid analgesics. The decision will be made following CDC guidelines. This decision will be based on the results of diagnostic studies, as well as Ms. Mcglaughlin's risk profile.  Gabapentin vs Lyrica Mobic   Interventional Therapies: Interventional procedures under consideration:  Diagnostic Bilateral Lumbar Facet Block under fluoroscopy and IV sedation. Possible bilateral lumbar facet radiofrequency ablation. Diagnostic right intra-articular shoulder joint injection under fluoroscopic guidance, no sedation. Possible diagnostic right-sided suprascapular nerve block under fluoroscopic guidance, with or without sedation. Possible right-sided suprascapular nerve radiofrequency ablation. Right-sided diagnostic CESI under fluoro and IV sedation Diagnostic GONB Possible right-sided occipital nerve radiofrequency ablation    Requested PM Follow-up: Return for 2nd Visit Eval, After MedPsych Eval.  Future Appointments Date Time Provider [place]  10/28/2015 10:40 AM Arnetha Courser, MD [place] None    Primary Care Physician: Enid Derry, MD Location: [facility] Facility Note by: Kathlen Brunswick. Dossie Arbour, M.D, DABA, DABAPM, DABPM, DABIPP, FIPP  Pain Score Disclaimer: We use the NRS-11 scale. This is a self-reported, subjective measurement of pain severity with only modest accuracy. It is used primarily to identify changes within a particular patient. It must be understood that outpatient pain scales are significantly less accurate that those used for research, where they can be applied under ideal controlled circumstances with minimal exposure to variables.  In reality, the score is likely to be a combination of pain intensity and pain affect, where pain affect describes the degree of emotional arousal or changes in action readiness caused by the  sensory experience of pain. Factors such as social and work situation, setting, emotional state, anxiety levels, expectation, and prior pain experience may influence pain perception and show large inter-individual differences that may also be affected by time variables.  Patient instructions provided during this appointment: Patient Instructions  INSTRUCTED TO GET LABWORK AND XRAYS DONE IN THE MEDICAL MALL AS SOON AS POSSIBLE.

## 2015-10-18 NOTE — Patient Instructions (Signed)
INSTRUCTED TO GET LABWORK AND XRAYS DONE IN THE MEDICAL MALL AS SOON AS POSSIBLE.

## 2015-10-20 ENCOUNTER — Ambulatory Visit
Admission: RE | Admit: 2015-10-20 | Discharge: 2015-10-20 | Disposition: A | Discharge status: Home / Self Care | Payer: Medicare Other | Source: Ambulatory Visit | Attending: Pain Medicine | Admitting: Pain Medicine

## 2015-10-20 ENCOUNTER — Other Ambulatory Visit
Admission: RE | Admit: 2015-10-20 | Discharge: 2015-10-20 | Disposition: A | Discharge status: Home / Self Care | Payer: Medicare Other | Source: Ambulatory Visit | Attending: Pain Medicine | Admitting: Pain Medicine

## 2015-10-20 ENCOUNTER — Encounter: Payer: Self-pay | Admitting: Pain Medicine

## 2015-10-20 DIAGNOSIS — M51379 Other intervertebral disc degeneration, lumbosacral region without mention of lumbar back pain or lower extremity pain: Secondary | ICD-10-CM

## 2015-10-20 DIAGNOSIS — M79604 Pain in right leg: Secondary | ICD-10-CM

## 2015-10-20 DIAGNOSIS — M545 Low back pain: Secondary | ICD-10-CM

## 2015-10-20 DIAGNOSIS — M8580 Other specified disorders of bone density and structure, unspecified site: Secondary | ICD-10-CM | POA: Insufficient documentation

## 2015-10-20 DIAGNOSIS — M25511 Pain in right shoulder: Principal | ICD-10-CM

## 2015-10-20 DIAGNOSIS — M5137 Other intervertebral disc degeneration, lumbosacral region: Secondary | ICD-10-CM

## 2015-10-20 DIAGNOSIS — I700 Atherosclerosis of aorta: Secondary | ICD-10-CM | POA: Insufficient documentation

## 2015-10-20 DIAGNOSIS — G894 Chronic pain syndrome: Secondary | ICD-10-CM

## 2015-10-20 DIAGNOSIS — M47816 Spondylosis without myelopathy or radiculopathy, lumbar region: Secondary | ICD-10-CM | POA: Diagnosis not present

## 2015-10-20 DIAGNOSIS — M5030 Other cervical disc degeneration, unspecified cervical region: Secondary | ICD-10-CM | POA: Insufficient documentation

## 2015-10-20 DIAGNOSIS — M47812 Spondylosis without myelopathy or radiculopathy, cervical region: Secondary | ICD-10-CM | POA: Diagnosis not present

## 2015-10-20 DIAGNOSIS — M542 Cervicalgia: Secondary | ICD-10-CM

## 2015-10-20 DIAGNOSIS — G8929 Other chronic pain: Secondary | ICD-10-CM

## 2015-10-20 DIAGNOSIS — M2550 Pain in unspecified joint: Secondary | ICD-10-CM | POA: Insufficient documentation

## 2015-10-20 LAB — COMPREHENSIVE METABOLIC PANEL
ALBUMIN: 4 g/dL (ref 3.5–5.0)
ALT: 16 U/L (ref 14–54)
ANION GAP: 9 (ref 5–15)
AST: 21 U/L (ref 15–41)
Alkaline Phosphatase: 88 U/L (ref 38–126)
BUN: 11 mg/dL (ref 6–20)
CHLORIDE: 107 mmol/L (ref 101–111)
CO2: 25 mmol/L (ref 22–32)
Calcium: 9.3 mg/dL (ref 8.9–10.3)
Creatinine, Ser: 0.77 mg/dL (ref 0.44–1.00)
GFR calc Af Amer: >60 mL/min (ref >60)
GLUCOSE: 130 mg/dL — AB (ref 65–99)
POTASSIUM: 3.3 mmol/L — AB (ref 3.5–5.1)
Sodium: 141 mmol/L (ref 135–145)
TOTAL PROTEIN: 7.8 g/dL (ref 6.5–8.1)
Total Bilirubin: 1.5 mg/dL — ABNORMAL HIGH (ref 0.3–1.2)

## 2015-10-20 LAB — SEDIMENTATION RATE: Sed Rate: 27 mm/h (ref 0–30)

## 2015-10-20 LAB — MAGNESIUM: Magnesium: 2 mg/dL (ref 1.7–2.4)

## 2015-10-20 LAB — VITAMIN B12: Vitamin B-12: 515 pg/mL (ref 180–914)

## 2015-10-20 LAB — C-REACTIVE PROTEIN: CRP: 1.6 mg/dL — ABNORMAL HIGH (ref <1.0)

## 2015-10-23 LAB — 25-HYDROXYVITAMIN D LCMS D2+D3: 25-HYDROXY, VITAMIN D-3: 11 ng/mL

## 2015-10-23 LAB — 25-HYDROXY VITAMIN D LCMS D2+D3
25-Hydroxy, Vitamin D-2: 1.4 ng/mL
25-Hydroxy, Vitamin D: 12 ng/mL — ABNORMAL LOW

## 2015-10-27 LAB — COMPLIANCE DRUG ANALYSIS, UR

## 2015-10-28 ENCOUNTER — Ambulatory Visit: Payer: Self-pay | Admitting: Family Medicine

## 2015-11-10 ENCOUNTER — Other Ambulatory Visit: Payer: Self-pay | Admitting: Pain Medicine

## 2015-11-10 ENCOUNTER — Encounter: Payer: Self-pay | Admitting: Pain Medicine

## 2015-11-10 DIAGNOSIS — R7982 Elevated C-reactive protein (CRP): Secondary | ICD-10-CM | POA: Insufficient documentation

## 2015-11-10 DIAGNOSIS — E559 Vitamin D deficiency, unspecified: Secondary | ICD-10-CM | POA: Insufficient documentation

## 2015-11-10 HISTORY — DX: Elevated C-reactive protein (CRP): R79.82

## 2015-11-10 MED ORDER — VITAMIN D3 50 MCG (2000 UT) PO CAPS: ORAL_CAPSULE | ORAL | 99 refills | Status: DC

## 2015-11-10 MED ORDER — VITAMIN D (ERGOCALCIFEROL) 1.25 MG (50000 UNIT) PO CAPS: ORAL_CAPSULE | ORAL | 0 refills | Status: DC

## 2015-11-10 NOTE — Progress Notes (Signed)

## 2015-11-10 NOTE — Progress Notes (Signed)
-  Normal levels of C-Reactive Protein for our Lab are less than 1.0 mg/L. C-reactive protein (CRP) is produced by the liver. The level of CRP rises when there is inflammation throughout the body. CRP goes up in response to inflammation. High levels suggests the presence of chronic inflammation but do not identify its location or cause. High levels have been observed in obese patients, individuals with bacterial infections, chronic inflammation, or flare-ups of inflammatory conditions. Drops of previously elevated levels suggest that the inflammation or infection is subsiding and/or responding to treatment.   - The combined elevation of the ESR & CRP, may be suggestive of an autoimmune disease. Should this be the case, we will inquire if the patient has had a rheumatologic evaluation looking at  the RF levels, ANA levels, and CBC.

## 2015-11-10 NOTE — Progress Notes (Signed)
-   Potassium levels below 3.6 mmol/L are considered to be low. Levels (less than 2.5 mmol/L) can be life-threatening and requires urgent medical attention. Low potassium (hypokalemia) has many causes. The most common cause is excessive potassium loss in urine due to prescription water or fluid pills (diuretics). Vomiting or diarrhea or both can result in excessive potassium loss from the digestive tract. Causes of potassium loss leading to low potassium include: chronic kidney disease; diabetic ketoacidosis; diarrhea; excessive alcohol use; excessive laxative use; excessive sweating; folic acid deficiency; diuretics; primary aldosteronism; vomiting; and/or some antibiotic use. - Normal fasting (NPO x 8 hours) glucose levels are between 65-99 mg/dl, with 2 hour fasting, levels are usually less than 140 mg/dl. Any random blood glucose level greater than 200 mg/dl is considered to be Diabetes.   - This is a blood test that measures the amount of a substance called bilirubin. This test is used to find out how well your liver is working. It is often given as part of a panel of tests that measure liver function. A small amount of bilirubin in your blood is normal, but a high level may be a sign of liver disease. - The liver makes bile to help you digest food, and bile contains bilirubin. Most bilirubin comes from the body's normal process of breaking down old red blood cells. A healthy liver can normally get rid of bilirubin. But when you have liver problems, it can build up in your body to unhealthy levels. - The reference range of total bilirubin is 0.2-1.2 mg/dL. The reference range of direct bilirubin is 0.1-0.4 mg/dL. - Elevated bilirubin levels (>2.5-3 mg/dL) cause jaundice and can be classified into different anatomical sites of pathology: prehepatic (increased bilirubin production), hepatic (liver dysfunction), or posthepatic (duct obstruction). - Because Total Bilirubin is the sum of the conjugated and the  unconjugated bilirubin, elevated levels may require further testing.

## 2015-11-11 ENCOUNTER — Telehealth: Payer: Self-pay | Admitting: Family Medicine

## 2015-11-11 NOTE — Telephone Encounter (Signed)
Pt.notified

## 2015-11-11 NOTE — Telephone Encounter (Signed)
Please contact patient I received a copy of her labs from Dr. Dossie Arbour He's starting her on Rx vitamin D I'll suggest she eat a banana daily for the next five or six days

## 2015-11-11 NOTE — Telephone Encounter (Signed)
-----   Message from Milinda Pointer, MD sent at 11/10/2015  5:53 PM EDT ----- - Potassium levels below 3.6 mmol/L are considered to be low. Levels (less than 2.5 mmol/L) can be life-threatening and requires urgent medical attention. Low potassium (hypokalemia) has many causes. The most common cause is excessive potassium loss in urine due to prescription water or fluid pills (diuretics). Vomiting or diarrhea or both can result in excessive potassium loss from the digestive tract. Causes of potassium loss leading to low potassium include: chronic kidney disease; diabetic ketoacidosis; diarrhea; excessive alcohol use; excessive laxative use; excessive sweating; folic acid deficiency; diuretics; primary aldosteronism; vomiting; and/or some antibiotic use. - Normal fasting (NPO x 8 hours) glucose levels are between 65-99 mg/dl, with 2 hour fasting, levels are usually less than 140 mg/dl. Any random blood glucose level greater than 200 mg/dl is considered to be Diabetes.   - This is a blood test that measures the amount of a substance called bilirubin. This test is used to find out how well your liver is working. It is often given as part of a panel of tests that measure liver function. A small amount of bilirubin in your blood is normal, but a high level may be a sign of liver disease. - The liver makes bile to help you digest food, and bile contains bilirubin. Most bilirubin comes from the body's normal process of breaking down old red blood cells. A healthy liver can normally get rid of bilirubin. But when you have liver problems, it can build up in your body to unhealthy levels. - The reference range of total bilirubin is 0.2-1.2 mg/dL. The reference range of direct bilirubin is 0.1-0.4 mg/dL. - Elevated bilirubin levels (>2.5-3 mg/dL) cause jaundice and can be classified into different anatomical sites of pathology: prehepatic (increased bilirubin production), hepatic (liver dysfunction), or posthepatic (duct  obstruction). - Because Total Bilirubin is the sum of the conjugated and the unconjugated bilirubin, elevated levels may require further testing.

## 2015-11-11 NOTE — Telephone Encounter (Signed)
Dr. lada would love for her Vit D3 to get higher therefore called pt and suggest foods she can eat that are rich in Vit D3.

## 2015-11-21 NOTE — Telephone Encounter (Signed)
We actually wanted her to eat foods rich in potassium, but I believe message still got intent across

## 2016-01-26 ENCOUNTER — Telehealth: Payer: Self-pay

## 2016-01-26 NOTE — Telephone Encounter (Signed)
Received an letter from patient insurance regarding diabetes Rx monitong as well as cholesterol medicine monitoring. Dr. Sanda Klein would like for me to make sure that she is still taking the her Atorvastatin. No answer , called twice and left voicemail asking patient to give me a call back regarding this matter.

## 2016-02-02 ENCOUNTER — Ambulatory Visit: Payer: Medicare Other | Admitting: Pain Medicine

## 2016-02-22 ENCOUNTER — Ambulatory Visit: Payer: Medicare Other | Admitting: Pain Medicine

## 2016-03-07 ENCOUNTER — Ambulatory Visit: Payer: Medicare Other | Admitting: Pain Medicine

## 2016-03-07 DIAGNOSIS — G894 Chronic pain syndrome: Secondary | ICD-10-CM | POA: Insufficient documentation

## 2016-03-07 NOTE — Progress Notes (Deleted)
Patient's Name: Deborah Blackwell  MRN: [id number]  Referring Provider: Arnetha Courser, MD  DOB: October 22, 1960  PCP: Arnetha Courser, MD  DOS: 03/07/2016  Note by: Kathlen Brunswick. Dossie Arbour, MD  Service setting: Ambulatory outpatient  Specialty: Interventional Pain Management  Location: [facility] (AMB) Pain Management Facility    Patient type: Established   Primary Reason(s) for Visit: Encounter for evaluation before starting new chronic pain management plan of care (Level of risk: moderate) CC: No chief complaint on file.  HPI  Deborah Blackwell is a 56 y.o. year old, female patient, who comes today for a follow-up evaluation to review the test results and decide on a treatment plan. She has COPD (chronic obstructive pulmonary disease) ([place]); Diabetes mellitus with diabetic neuropathy ([place]); Hypertension; Gout; Hyperlipidemia; Hypothyroidism; Congenital scoliosis; Encounter for therapeutic drug level monitoring; Long term prescription opiate use; Urinary frequency; Type 2 diabetes, controlled, with neuropathy ([place]); Depression; Chronic neck pain (Location of Tertiary source of pain) (Right); Cervical disc disorder with radiculopathy of cervical region; Acute cystitis; Drug-seeking behavior; Long term current use of opiate analgesic; Opiate use; Encounter for chronic pain management; Chronic shoulder pain (Right); Chronic low back pain (Location of Primary Source of Pain) (Right); Chronic lower extremity pain (Location of Secondary source of pain) (Right); Occipital neuralgia (Right); Cervicogenic headache; Arthralgia; Lumbar facet hypertrophy (Bilateral); Lumbar facet syndrome (Location of Primary Source of Pain) (Right); Lumbar spondylosis; Osteopenia; Vitamin D deficiency; Elevated C-reactive protein (CRP); and Chronic pain syndrome on her problem list. Her primarily concern today is the No chief complaint on file.  Pain Assessment: Self-Reported Pain Score:  /10             Reported level is compatible with observation.           Deborah Blackwell comes in today for a follow-up visit after her initial evaluation on 10/18/2015. Today we went over the results of her tests. These were explained in "Layman's terms". During today's appointment we went over my diagnostic impression, as well as the proposed treatment plan.  In considering the treatment plan options, Deborah Blackwell was reminded that I no longer take patients for medication management only. I asked her to let me know if she had no intention of taking advantage of the interventional therapies, so that we could make arrangements to provide this space to someone interested. I also made it clear that undergoing interventional therapies for the purpose of getting pain medications is very inappropriate on the part of a patient, and it will not be tolerated in this practice. This type of behavior would suggest true addiction and therefore it requires referral to an addiction specialist.   Further details on both, my assessment(s), as well as the proposed treatment plan, please see below. Controlled Substance Pharmacotherapy Assessment REMS (Risk Evaluation and Mitigation Strategy)  Analgesic: Oxycodone 15 mg twice a day from Dr. Idelia Salm, last filled on 07/16/2015. MME/day: 45 mg/day Pill Count: None expected due to no prior prescriptions written by our practice. Pharmacokinetics: Liberation and absorption (onset of action): WNL Distribution (time to peak effect): WNL Metabolism and excretion (duration of action): WNL         Pharmacodynamics: Desired effects: Analgesia: Deborah Blackwell reports >50% benefit. Functional ability: Patient reports that medication allows her to accomplish basic ADLs Clinically meaningful improvement in function (CMIF): Sustained CMIF goals met Perceived effectiveness: Described as relatively effective, allowing for increase in activities of daily living (ADL) Undesirable effects: Side-effects or Adverse reactions: None reported Monitoring: [place] PMP: Online  review of the past 19-monthperiod previously conducted. Not applicable at this point since we have not taken over the patient's medication management yet. List of all Serum Drug Screening Test(s):  No results found for: AMPHSCRSER, BARBSCRSER, BENZOSCRSER, COCAINSCRSER, PCPSCRSER, THCSCRSER, OPIATESCRSER, OSweet [place]ag[place]oll[place]List of all UDS test(s) done:  Lab Results  Component Value Date   TOXASSSELUR FINAL 01/27/2015   SUMMARY FINAL 10/18/2015   Last UDS on record: ToxAssure Select 13  Date Value Ref Range Status  01/27/2015 FINAL  Final    Comment:    ==================================================================== TOXASSURE SELECT 13 (MW) ==================================================================== Test                             Result       Flag       Units Drug Present and Declared for Prescription Verification   Alprazolam                     307          EXPECTED   ng/mg creat   Alpha-hydroxyalprazolam        774          EXPECTED   ng/mg creat    Source of alprazolam is a scheduled prescription medication.    Alpha-hydroxyalprazolam is an expected metabolite of alprazolam.   Oxycodone                      265          EXPECTED   ng/mg creat   Oxymorphone                    341          EXPECTED   ng/mg creat   Noroxycodone                   2637         EXPECTED   ng/mg creat   Noroxymorphone                 150          EXPECTED   ng/mg creat    Sources of oxycodone are scheduled prescription medications.    Oxymorphone, noroxycodone, and noroxymorphone are expected    metabolites of oxycodone. Oxymorphone is also available as a    scheduled prescription medication. Drug Present not Declared for Prescription Verification   Methadone                      450          UNEXPECTED ng/mg creat   EDDP (Methadone Mtb)           348          UNEXPECTED ng/mg creat    Sources of methadone include scheduled prescription medications.    EDDP is an expected  metabolite of methadone. ==================================================================== Test                      Result    Flag   Units      Ref Range   Creatinine              135              mg/dL      >=20 ==================================================================== Declared Medications:  The flagging and interpretation on this report are based on  the  following declared medications.  Unexpected results may arise from  inaccuracies in the declared medications.  **Note: The testing scope of this panel includes these medications:  Alprazolam (Xanax)  Oxycodone ==================================================================== For clinical consultation, please call [phone]. ====================================================================    Summary  Date Value Ref Range Status  10/18/2015 FINAL  Final    Comment:    ==================================================================== TOXASSURE COMP DRUG ANALYSIS,UR ==================================================================== Test                             Result       Flag       Units Drug Present and Declared for Prescription Verification   Atenolol                       PRESENT      EXPECTED Drug Present not Declared for Prescription Verification   Alprazolam                     98           UNEXPECTED ng/mg creat   Alpha-hydroxyalprazolam        1094         UNEXPECTED ng/mg creat    Source of alprazolam is a scheduled prescription medication.    Alpha-hydroxyalprazolam is an expected metabolite of alprazolam.   Lorazepam                      922          UNEXPECTED ng/mg creat    Source of lorazepam is a scheduled prescription medication.   7-aminoclonazepam              33           UNEXPECTED ng/mg creat    7-aminoclonazepam is an expected metabolite of clonazepam. Source    of clonazepam is a scheduled prescription medication.   Noroxycodone                   63           UNEXPECTED  ng/mg creat   Noroxymorphone                 29           UNEXPECTED ng/mg creat    Noroxycodone and noroxymorphone are expected metabolites of    oxycodone. Noroxymorphone is an expected metabolite of    oxymorphone. Sources of oxycodone and/or oxymorphone are    scheduled prescription medications. ==================================================================== Test                      Result    Flag   Units      Ref Range   Creatinine              219              mg/dL      >=20 ==================================================================== Declared Medications:  The flagging and interpretation on this report are based on the  following declared medications.  Unexpected results may arise from  inaccuracies in the declared medications.  **Note: The testing scope of this panel includes these medications:  Atenolol  **Note: The testing scope of this panel does not include following  reported medications:  Albuterol (Proventil)  Amlodipine  Atorvastatin  Budenoside (Symbicort)  Formoterol (Symbicort)  Levothyroxine  Losartan ==================================================================== For clinical consultation, please  call [phone]. ====================================================================    UDS interpretation: Unexpected findings: Patient informed of the CDC guidelines and recommendations to stay away from the concomitant use of benzodiazepines and opioids due to the increased risk of respiratory depression and death. In addition, the patient had several on the declared medications found on the UDS. Medication Assessment Form: Patient introduced to form today Treatment compliance: Not applicable Risk Assessment Profile: Aberrant behavior: prescription drug misuse Comorbid factors increasing risk of overdose: Concomitant use of Benzodiazepines, A history of substance abuse and COPD or asthma Risk Mitigation Strategies:  Patient opioid safety  counseling: Opioid therapy will not be included in the treatment plan Patient-Prescriber Agreement (PPA): No agreement signed  Controlled substance notification to other providers: None required. No opioid therapy  Pharmacologic Plan: Patient is not an appropriate candidate for opioid therapy at this time  Laboratory Chemistry  Inflammation Markers Lab Results  Component Value Date   ESRSEDRATE 27 10/20/2015   CRP 1.6 (H) 10/20/2015   Renal Function Lab Results  Component Value Date   BUN 11 10/20/2015   CREATININE 0.77 10/20/2015   GFRAA >60 10/20/2015   GFRNONAA >60 10/20/2015   Hepatic Function Lab Results  Component Value Date   AST 21 10/20/2015   ALT 16 10/20/2015   ALBUMIN 4.0 10/20/2015   Electrolytes Lab Results  Component Value Date   NA 141 10/20/2015   K 3.3 (L) 10/20/2015   CL 107 10/20/2015   CALCIUM 9.3 10/20/2015   MG 2.0 10/20/2015   Pain Modulating Vitamins Lab Results  Component Value Date   25OHVITD1 12 (L) 10/20/2015   25OHVITD2 1.4 10/20/2015   25OHVITD3 11 10/20/2015   VITAMINB12 515 10/20/2015   Coagulation Parameters Lab Results  Component Value Date   PLT 300 09/30/2015   Cardiovascular Lab Results  Component Value Date   HGB 14.7 09/30/2015   HCT 44.3 09/30/2015   Note: Lab results reviewed.  Recent Diagnostic Imaging Review  Dg Cervical Spine Complete Result Date: 10/20/2015 CLINICAL DATA:  Chronic pain. No known injury. Initial evaluation . EXAM: CERVICAL SPINE - COMPLETE 4+ VIEW COMPARISON:  No recent prior. FINDINGS: Diffuse multilevel degenerative change. No acute abnormality identified. Ligamentous calcification. Diffuse osteopenia. IMPRESSION: Diffuse degenerative change.  No acute abnormality identified. Electronically Signed   By: Marcello Moores  Register   On: 10/20/2015 12:07   Dg Lumbar Spine Complete W/bend Result Date: 10/20/2015 CLINICAL DATA:  Chronic pain. Radiation down right leg. No known injury . EXAM: LUMBAR SPINE  - COMPLETE WITH BENDING VIEWS COMPARISON:  MRI 10/30/2014. FINDINGS: Mild scoliosis concave right. Diffuse degenerative change. No acute bony abnormality identified. No evidence fracture. Diffuse osteopenia. Pelvic calcification consistent with phlebolith. Aortic atherosclerotic vascular calcification IMPRESSION: 1. Diffuse degenerative change lumbar spine. No acute abnormality identified. Mild scoliosis concave right. No flexion or extension abnormality. 2.  Aortic atherosclerotic vascular disease. Electronically Signed   By: Marcello Moores  Register   On: 10/20/2015 12:09   Dg Shoulder Right Result Date: 10/20/2015 CLINICAL DATA:  Right shoulder pain.  No known injury. EXAM: RIGHT SHOULDER - 2+ VIEW COMPARISON:  No recent prior. FINDINGS: No acute bony or joint abnormality identified. No evidence of fracture or dislocation. IMPRESSION: No acute or focal abnormality. Electronically Signed   By: Marcello Moores  Register   On: 10/20/2015 12:11   Cervical Imaging: Cervical DG complete:  Results for orders placed during the hospital encounter of 10/20/15  DG Cervical Spine Complete   Narrative CLINICAL DATA:  Chronic pain. No known injury. Initial  evaluation .  EXAM: CERVICAL SPINE - COMPLETE 4+ VIEW  COMPARISON:  No recent prior.  FINDINGS: Diffuse multilevel degenerative change. No acute abnormality identified. Ligamentous calcification. Diffuse osteopenia.  IMPRESSION: Diffuse degenerative change.  No acute abnormality identified.   Electronically Signed   By: Marcello Moores  Register   On: 10/20/2015 12:07    Shoulder Imaging: Gaston Islam DG:  Results for orders placed during the hospital encounter of 10/20/15  DG Shoulder Right   Narrative CLINICAL DATA:  Right shoulder pain.  No known injury.  EXAM: RIGHT SHOULDER - 2+ VIEW  COMPARISON:  No recent prior.  FINDINGS: No acute bony or joint abnormality identified. No evidence of fracture or dislocation.  IMPRESSION: No acute or focal  abnormality.   Electronically Signed   By: Marcello Moores  Register   On: 10/20/2015 12:11    Lumbosacral Imaging: Lumbar MR wo contrast:  Results for orders placed during the hospital encounter of 10/30/14  MR Lumbar Spine Wo Contrast   Narrative CLINICAL DATA:  Low back pain  EXAM: MRI LUMBAR SPINE WITHOUT CONTRAST  TECHNIQUE: Multiplanar, multisequence MR imaging of the lumbar spine was performed. No intravenous contrast was administered.  COMPARISON:  None.  FINDINGS: Normal lumbar alignment. Negative for fracture or mass lesion. Conus medullaris is normal and terminates at L1-2.  T11-12: Small central disc protrusion  T12-L1:  Negative  L1-2:  Negative  L2-3:  Negative  L3-4: Mild disc degeneration and mild facet degeneration without significant spinal or foraminal stenosis  L4-5: Mild disc degeneration. Small central disc protrusion without spinal or foraminal stenosis. Bilateral facet hypertrophy.  L5-S1: Small central disc protrusion. Bilateral facet hypertrophy. No significant foraminal stenosis.  IMPRESSION: Mild lumbar degenerative changes with small central disc protrusions at L4-5 and L5-S1. No significant neural impingement.   Electronically Signed   By: Franchot Gallo M.D.   On: 10/30/2014 14:31    Lumbar DG Bending views:  Results for orders placed during the hospital encounter of 10/20/15  DG Lumbar Spine Complete W/Bend   Narrative CLINICAL DATA:  Chronic pain. Radiation down right leg. No known injury .  EXAM: LUMBAR SPINE - COMPLETE WITH BENDING VIEWS  COMPARISON:  MRI 10/30/2014.  FINDINGS: Mild scoliosis concave right. Diffuse degenerative change. No acute bony abnormality identified. No evidence fracture. Diffuse osteopenia. Pelvic calcification consistent with phlebolith. Aortic atherosclerotic vascular calcification  IMPRESSION: 1. Diffuse degenerative change lumbar spine. No acute abnormality identified. Mild scoliosis concave  right. No flexion or extension abnormality.  2.  Aortic atherosclerotic vascular disease.   Electronically Signed   By: Marcello Moores  Register   On: 10/20/2015 12:09    Note: Results of ordered imaging test(s) reviewed and explained to patient in Layman's terms. Copy of results provided to patient  Meds  The patient has a current medication list which includes the following prescription(s): albuterol, amlodipine, atenolol, atorvastatin, budesonide-formoterol, levothyroxine, losartan, and vitamin d (ergocalciferol).  Current Outpatient Prescriptions on File Prior to Visit  Medication Sig  . albuterol (VENTOLIN HFA) 108 (90 BASE) MCG/ACT inhaler Inhale 2 puffs into the lungs every 6 (six) hours as needed for wheezing or shortness of breath.  Marland Kitchen amLODipine (NORVASC) 10 MG tablet Take 10 mg by mouth daily. Reported on 03/23/2015  . atenolol (TENORMIN) 50 MG tablet Take 1 tablet (50 mg total) by mouth 2 (two) times daily.  Marland Kitchen atorvastatin (LIPITOR) 40 MG tablet Take 40 mg by mouth at bedtime.  . budesonide-formoterol (SYMBICORT) 160-4.5 MCG/ACT inhaler Inhale 2 puffs into the lungs 2 (two)  times daily.  Marland Kitchen levothyroxine (SYNTHROID, LEVOTHROID) 75 MCG tablet Take 1 tablet (75 mcg total) by mouth daily before breakfast.  . losartan (COZAAR) 100 MG tablet Take 1 tablet (100 mg total) by mouth daily.  . Vitamin D, Ergocalciferol, (DRISDOL) 50000 units CAPS capsule Take 1 capsule (50,000 Units total) by mouth 2 (two) times a week. X 6 weeks.   No current facility-administered medications on file prior to visit.    ROS  Constitutional: Denies any fever or chills Gastrointestinal: No reported hemesis, hematochezia, vomiting, or acute GI distress Musculoskeletal: Denies any acute onset joint swelling, redness, loss of ROM, or weakness Neurological: No reported episodes of acute onset apraxia, aphasia, dysarthria, agnosia, amnesia, paralysis, loss of coordination, or loss of consciousness  Allergies  Ms.  Blackwell is allergic to contrast media [iodinated diagnostic agents]; aspirin; and macrobid [nitrofurantoin monohyd macro].  PFSH  Drug: Deborah Blackwell  reports that she does not use drugs. Alcohol:  reports that she does not drink alcohol. Tobacco:  reports that she quit smoking about 25 years ago. Her smoking use included Cigarettes. She has a 10.00 pack-year smoking history. She has never used smokeless tobacco. Medical:  has a past medical history of Angina at [facility]); Arthritis; Asthma; Back pain with right-sided sciatica (09/30/2014); Bipolar disorder ([place]); Congenital scoliosis; COPD (chronic obstructive pulmonary disease) ([place]); DDD (degenerative disc disease), lumbar; Depression; Diabetes mellitus without complication ([place]); GERD (gastroesophageal reflux disease); Gout; History of kidney stones; History of scoliosis; Hyperlipidemia; Hypertension; Lumbar radiculopathy (09/30/2014); Migraines; Panic attack; and RA (rheumatoid arthritis) ([place]). Family: family history includes Alzheimer's disease in her father and mother; Cancer in her brother and mother; Dementia in her father and mother; Depression in her mother; Heart disease in her mother; Hypertension in her father, mother, and sister; Lung disease in her brother.  Past Surgical History:  Procedure Laterality Date  . ABDOMINAL HYSTERECTOMY  2003   one ovary remains: due to heavy bleeding/endometrioma/cysts  . BACK SURGERY     and injections  . CARPAL TUNNEL RELEASE    . FOOT SURGERY Right    bone spur  . POLYPECTOMY     uterine   Constitutional Exam  General appearance: Well nourished, well developed, and well hydrated. In no apparent acute distress There were no vitals filed for this visit. BMI Assessment: Estimated body mass index is 24.56 kg/m as calculated from the following:   Height as of 10/18/15: _0  (1.549 m).   Weight as of 10/18/15: 130 lb (59 kg).  BMI interpretation table: BMI level Category Range association with higher  incidence of chronic pain  <18 kg/m2 Underweight   18.5-24.9 kg/m2 Ideal body weight   25-29.9 kg/m2 Overweight Increased incidence by 20%  30-34.9 kg/m2 Obese (Class I) Increased incidence by 68%  35-39.9 kg/m2 Severe obesity (Class II) Increased incidence by 136%  >40 kg/m2 Extreme obesity (Class III) Increased incidence by 254%   BMI Readings from Last 4 Encounters:  10/18/15 24.56 kg/m  09/30/15 24.92 kg/m  07/16/15 24.94 kg/m  06/18/15 24.94 kg/m   Wt Readings from Last 4 Encounters:  10/18/15 130 lb (59 kg)  09/30/15 131 lb 14.4 oz (59.8 kg)  07/16/15 132 lb (59.9 kg)  06/18/15 132 lb (59.9 kg)  Psych/Mental status: Alert, oriented x 3 (person, place, & time)       Eyes: PERLA Respiratory: No evidence of acute respiratory distress  Cervical Spine Exam  Inspection: No masses, redness, or swelling Alignment: Symmetrical Functional ROM: Unrestricted ROM Stability:  No instability detected Muscle strength & Tone: Functionally intact Sensory: Unimpaired Palpation: Non-contributory  Upper Extremity (UE) Exam    Side: Right upper extremity  Side: Left upper extremity  Inspection: No masses, redness, swelling, or asymmetry. No contractures  Inspection: No masses, redness, swelling, or asymmetry. No contractures  Functional ROM: Unrestricted ROM          Functional ROM: Unrestricted ROM          Muscle strength & Tone: Functionally intact  Muscle strength & Tone: Functionally intact  Sensory: Unimpaired  Sensory: Unimpaired  Palpation: Euthermic  Palpation: Euthermic  Specialized Test(s): Deferred         Specialized Test(s): Deferred          Thoracic Spine Exam  Inspection: No masses, redness, or swelling Alignment: Symmetrical Functional ROM: Unrestricted ROM Stability: No instability detected Sensory: Unimpaired Muscle strength & Tone: Functionally intact Palpation: Non-contributory  Lumbar Spine Exam  Inspection: No masses, redness, or swelling Alignment:  Symmetrical Functional ROM: Unrestricted ROM Stability: No instability detected Muscle strength & Tone: Functionally intact Sensory: Unimpaired Palpation: Non-contributory Provocative Tests: Lumbar Hyperextension and rotation test: evaluation deferred today       Patrick's Maneuver: evaluation deferred today              Gait & Posture Assessment  Ambulation: Unassisted Gait: Relatively normal for age and body habitus Posture: WNL   Lower Extremity Exam    Side: Right lower extremity  Side: Left lower extremity  Inspection: No masses, redness, swelling, or asymmetry. No contractures  Inspection: No masses, redness, swelling, or asymmetry. No contractures  Functional ROM: Unrestricted ROM          Functional ROM: Unrestricted ROM          Muscle strength & Tone: Functionally intact  Muscle strength & Tone: Functionally intact  Sensory: Unimpaired  Sensory: Unimpaired  Palpation: No palpable anomalies  Palpation: No palpable anomalies   Assessment & Plan  Primary Diagnosis & Pertinent Problem List: The primary encounter diagnosis was Chronic low back pain (Location of Primary Source of Pain) (Right). Diagnoses of Lumbar facet syndrome (Location of Primary Source of Pain) (Right), Chronic lower extremity pain (Location of Secondary source of pain) (Right), Chronic neck pain (Location of Tertiary source of pain) (Right), and Chronic pain syndrome were also pertinent to this visit.  Visit Diagnosis: 1. Chronic low back pain (Location of Primary Source of Pain) (Right)   2. Lumbar facet syndrome (Location of Primary Source of Pain) (Right)   3. Chronic lower extremity pain (Location of Secondary source of pain) (Right)   4. Chronic neck pain (Location of Tertiary source of pain) (Right)   5. Chronic pain syndrome    Problems updated and reviewed during this visit: Problem  Chronic Pain Syndrome   Problem-specific Plan(s): No problem-specific Assessment & Plan notes found for this  encounter.  Assessment & plan notes cannot be loaded without a specified hospital service.  Plan of Care  Pharmacotherapy (Medications Ordered): No orders of the defined types were placed in this encounter.  Lab-work, procedure(s), and/or referral(s): No orders of the defined types were placed in this encounter.   Pharmacotherapy: Opioid Analgesics: We'll take over management today. See above orders Membrane stabilizer: We have discussed the possibility of optimizing this mode of therapy, if tolerated Muscle relaxant: We have discussed the possibility of a trial NSAID: We have discussed the possibility of a trial Other analgesic(s): To be determined at a later time  Interventional therapies: Planned, scheduled, and/or pending:    ***   Considering:   Diagnostic Bilateral Lumbar Facet Block Possible bilateral lumbar facet radiofrequency ablation. Diagnostic right intra-articular shoulder joint injection Possible diagnostic right-sided suprascapular nerve block Possible right-sided suprascapular nerve radiofrequency ablation. Right-sided diagnostic CESI Diagnostic GONB Possible right-sided occipital nerve radiofrequency ablation    PRN Procedures:   To be determined at a later time   Provider-requested follow-up: No Follow-up on file.  Future Appointments Date Time Provider [place]  03/07/2016 2:00 PM Milinda Pointer, MD [facility] None    Primary Care Physician: Arnetha Courser, MD Location: [facility] Note by: Kathlen Brunswick. Dossie Arbour, M.D, DABA, DABAPM, DABPM, DABIPP, FIPP Date: 03/07/2016; Time: 1:09 PM  Pain Score Disclaimer: We use the NRS-11 scale. This is a self-reported, subjective measurement of pain severity with only modest accuracy. It is used primarily to identify changes within a particular patient. It must be understood that outpatient pain scales are significantly less accurate that those used for research, where they  can be applied under ideal controlled circumstances with minimal exposure to variables. In reality, the score is likely to be a combination of pain intensity and pain affect, where pain affect describes the degree of emotional arousal or changes in action readiness caused by the sensory experience of pain. Factors such as social and work situation, setting, emotional state, anxiety levels, expectation, and prior pain experience may influence pain perception and show large inter-individual differences that may also be affected by time variables.  Patient instructions provided during this appointment: There are no Patient Instructions on file for this visit.

## 2016-04-04 ENCOUNTER — Ambulatory Visit: Payer: Medicare Other | Admitting: Pain Medicine

## 2016-05-02 ENCOUNTER — Encounter: Payer: Self-pay | Admitting: Pain Medicine

## 2016-05-02 ENCOUNTER — Encounter (INDEPENDENT_AMBULATORY_CARE_PROVIDER_SITE_OTHER): Payer: Self-pay

## 2016-05-02 ENCOUNTER — Ambulatory Visit: Payer: Medicare Other | Attending: Pain Medicine | Admitting: Pain Medicine

## 2016-05-02 VITALS — BP 126/70 | HR 106 | Temp 98.0°F | Resp 16 | Ht 61.0 in | Wt 120.0 lb

## 2016-05-02 DIAGNOSIS — M47816 Spondylosis without myelopathy or radiculopathy, lumbar region: Secondary | ICD-10-CM

## 2016-05-02 DIAGNOSIS — I10 Essential (primary) hypertension: Secondary | ICD-10-CM | POA: Insufficient documentation

## 2016-05-02 DIAGNOSIS — Z79899 Other long term (current) drug therapy: Secondary | ICD-10-CM | POA: Diagnosis not present

## 2016-05-02 DIAGNOSIS — E559 Vitamin D deficiency, unspecified: Secondary | ICD-10-CM | POA: Diagnosis not present

## 2016-05-02 DIAGNOSIS — G894 Chronic pain syndrome: Secondary | ICD-10-CM | POA: Insufficient documentation

## 2016-05-02 DIAGNOSIS — Z87891 Personal history of nicotine dependence: Secondary | ICD-10-CM | POA: Insufficient documentation

## 2016-05-02 DIAGNOSIS — N3000 Acute cystitis without hematuria: Secondary | ICD-10-CM | POA: Insufficient documentation

## 2016-05-02 DIAGNOSIS — F1190 Opioid use, unspecified, uncomplicated: Secondary | ICD-10-CM | POA: Diagnosis not present

## 2016-05-02 DIAGNOSIS — Z886 Allergy status to analgesic agent status: Secondary | ICD-10-CM | POA: Diagnosis not present

## 2016-05-02 DIAGNOSIS — M4696 Unspecified inflammatory spondylopathy, lumbar region: Secondary | ICD-10-CM | POA: Diagnosis not present

## 2016-05-02 DIAGNOSIS — M545 Low back pain: Secondary | ICD-10-CM | POA: Diagnosis not present

## 2016-05-02 DIAGNOSIS — M8580 Other specified disorders of bone density and structure, unspecified site: Secondary | ICD-10-CM | POA: Insufficient documentation

## 2016-05-02 DIAGNOSIS — M25511 Pain in right shoulder: Secondary | ICD-10-CM | POA: Diagnosis not present

## 2016-05-02 DIAGNOSIS — E1140 Type 2 diabetes mellitus with diabetic neuropathy, unspecified: Secondary | ICD-10-CM | POA: Diagnosis not present

## 2016-05-02 DIAGNOSIS — Z91041 Radiographic dye allergy status: Secondary | ICD-10-CM | POA: Insufficient documentation

## 2016-05-02 DIAGNOSIS — J449 Chronic obstructive pulmonary disease, unspecified: Secondary | ICD-10-CM | POA: Insufficient documentation

## 2016-05-02 DIAGNOSIS — M5441 Lumbago with sciatica, right side: Secondary | ICD-10-CM | POA: Diagnosis not present

## 2016-05-02 DIAGNOSIS — Z79891 Long term (current) use of opiate analgesic: Secondary | ICD-10-CM

## 2016-05-02 DIAGNOSIS — M109 Gout, unspecified: Secondary | ICD-10-CM | POA: Insufficient documentation

## 2016-05-02 DIAGNOSIS — M069 Rheumatoid arthritis, unspecified: Secondary | ICD-10-CM | POA: Insufficient documentation

## 2016-05-02 DIAGNOSIS — M79604 Pain in right leg: Secondary | ICD-10-CM

## 2016-05-02 DIAGNOSIS — Q675 Congenital deformity of spine: Secondary | ICD-10-CM | POA: Insufficient documentation

## 2016-05-02 DIAGNOSIS — M5136 Other intervertebral disc degeneration, lumbar region: Secondary | ICD-10-CM | POA: Insufficient documentation

## 2016-05-02 DIAGNOSIS — E785 Hyperlipidemia, unspecified: Secondary | ICD-10-CM | POA: Insufficient documentation

## 2016-05-02 DIAGNOSIS — R51 Headache: Secondary | ICD-10-CM | POA: Insufficient documentation

## 2016-05-02 DIAGNOSIS — M5412 Radiculopathy, cervical region: Secondary | ICD-10-CM | POA: Diagnosis not present

## 2016-05-02 DIAGNOSIS — M542 Cervicalgia: Secondary | ICD-10-CM

## 2016-05-02 DIAGNOSIS — K219 Gastro-esophageal reflux disease without esophagitis: Secondary | ICD-10-CM | POA: Insufficient documentation

## 2016-05-02 DIAGNOSIS — F319 Bipolar disorder, unspecified: Secondary | ICD-10-CM | POA: Diagnosis not present

## 2016-05-02 DIAGNOSIS — G8929 Other chronic pain: Secondary | ICD-10-CM

## 2016-05-02 NOTE — Progress Notes (Signed)
Safety precautions to be maintained throughout the outpatient stay will include: orient to surroundings, keep bed in low position, maintain call bell within reach at all times, provide assistance with transfer out of bed and ambulation.  

## 2016-05-02 NOTE — Patient Instructions (Addendum)
Preparing for Procedure with Sedation Instructions: . Oral Intake: Do not eat or drink anything for at least 8 hours prior to your procedure. . Transportation: Public transportation is not allowed. Bring an adult driver. The driver must be physically present in our waiting room before any procedure can be started. . Physical Assistance: Bring an adult capable of physically assisting you, in the event you need help. . Blood Pressure Medicine: Take your blood pressure medicine with a sip of water the morning of the procedure. . Insulin: Take only  of your normal insulin dose. . Preventing infections: Shower with an antibacterial soap the morning of your procedure. . Build-up your immune system: Take 1000 mg of Vitamin C with every meal (3 times a day) the day prior to your procedure. . Pregnancy: If you are pregnant, call and cancel the procedure. . Sickness: If you have a cold, fever, or any active infections, call and cancel the procedure. . Arrival: You must be in the facility at least 30 minutes prior to your scheduled procedure. . Children: Do not bring children with you. . Dress appropriately: Bring dark clothing that you would not mind if they get stained. . Valuables: Do not bring any jewelry or valuables. Procedure appointments are reserved for interventional treatments only. . No Prescription Refills. . No medication changes will be discussed during procedure appointments. No disability issues will be discussed.Facet Blocks Patient Information  Description: The facets are joints in the spine between the vertebrae.  Like any joints in the body, facets can become irritated and painful.  Arthritis can also effect the facets.  By injecting steroids and local anesthetic in and around these joints, we can temporarily block the nerve supply to them.  Steroids act directly on irritated nerves and tissues to reduce selling and inflammation which often leads to decreased pain.  Facet blocks may  be done anywhere along the spine from the neck to the low back depending upon the location of your pain.   After numbing the skin with local anesthetic (like Novocaine), a small needle is passed onto the facet joints under x-ray guidance.  You may experience a sensation of pressure while this is being done.  The entire block usually lasts about 15-25 minutes.   Conditions which may be treated by facet blocks:  Low back/buttock pain Neck/shoulder pain Certain types of headaches  Preparation for the injection:  Do not eat any solid food or dairy products within 8 hours of your appointment. You may drink clear liquid up to 3 hours before appointment.  Clear liquids include water, black coffee, juice or soda.  No milk or cream please. You may take your regular medication, including pain medications, with a sip of water before your appointment.  Diabetics should hold regular insulin (if taken separately) and take 1/2 normal NPH dose the morning of the procedure.  Carry some sugar containing items with you to your appointment. A driver must accompany you and be prepared to drive you home after your procedure. Bring all your current medications with you. An IV may be inserted and sedation may be given at the discretion of the physician. A blood pressure cuff, EKG and other monitors will often be applied during the procedure.  Some patients may need to have extra oxygen administered for a short period. You will be asked to provide medical information, including your allergies and medications, prior to the procedure.  We must know immediately if you are taking blood thinners (like Coumadin/Warfarin) or if   you are allergic to IV iodine contrast (dye).  We must know if you could possible be pregnant.  Possible side-effects:  Bleeding from needle site Infection (rare, may require surgery) Nerve injury (rare) Numbness & tingling (temporary) Difficulty urinating (rare, temporary) Spinal headache (a  headache worse with upright posture) Light-headedness (temporary) Pain at injection site (serveral days) Decreased blood pressure (rare, temporary) Weakness in arm/leg (temporary) Pressure sensation in back/neck (temporary)   Call if you experience:  Fever/chills associated with headache or increased back/neck pain Headache worsened by an upright position New onset, weakness or numbness of an extremity below the injection site Hives or difficulty breathing (go to the emergency room) Inflammation or drainage at the injection site(s) Severe back/neck pain greater than usual New symptoms which are concerning to you  Please note:  Although the local anesthetic injected can often make your back or neck feel good for several hours after the injection, the pain will likely return. It takes 3-7 days for steroids to work.  You may not notice any pain relief for at least one week.  If effective, we will often do a series of 2-3 injections spaced 3-6 weeks apart to maximally decrease your pain.  After the initial series, you may be a candidate for a more permanent nerve block of the facets.  If you have any questions, please [phone] . [facility] 

## 2016-05-02 NOTE — Progress Notes (Signed)
Patient's Name: Deborah Blackwell  MRN: [id number]  Referring Provider: Arnetha Courser, MD  DOB: October 10, 1960  PCP: Arnetha Courser, MD  DOS: 05/02/2016  Note by: Kathlen Brunswick. Dossie Arbour, MD  Service setting: Ambulatory outpatient  Specialty: Interventional Pain Management  Location: [facility] (AMB) Pain Management Facility    Patient type: Established   Primary Reason(s) for Visit: Encounter for evaluation before starting new chronic pain management plan of care (Level of risk: moderate) CC: Shoulder Pain (right); Back Pain (upper and lower ); Hand Pain (both); and Foot Pain (both)  HPI  Deborah Blackwell is a 56 y.o. year old, female patient, who comes today for a follow-up evaluation to review the test results and decide on a treatment plan. She has COPD (chronic obstructive pulmonary disease) ([place]); Diabetes mellitus with diabetic neuropathy ([place]); Hypertension; Gout; Hyperlipidemia; Hypothyroidism; Congenital scoliosis; Encounter for therapeutic drug level monitoring; Long term prescription opiate use; Urinary frequency; Type 2 diabetes, controlled, with neuropathy ([place]); Depression; Chronic neck pain (Location of Tertiary source of pain) (Right); Cervical disc disorder with radiculopathy of cervical region; Acute cystitis; Drug-seeking behavior; Long term current use of opiate analgesic; Opiate use; Encounter for chronic pain management; Chronic shoulder pain (Right); Chronic low back pain (Location of Primary Source of Pain) (Right); Chronic lower extremity pain (Location of Secondary source of pain) (Right); Occipital neuralgia (Right); Cervicogenic headache; Arthralgia; Lumbar facet hypertrophy (Bilateral); Lumbar facet syndrome (Location of Primary Source of Pain) (Right); Lumbar spondylosis; Osteopenia; Vitamin D deficiency; Elevated C-reactive protein (CRP); and Chronic pain syndrome on her problem list. Her primarily concern today is the Shoulder Pain (right); Back Pain (upper and lower ); Hand Pain (both); and Foot  Pain (both)  Pain Assessment: Self-Reported Pain Score: 7 /10 Clinically the patient looks like a 2/10 Reported level is inconsistent with clinical observations. Information on the proper use of the pain scale provided to the patient today Pain Type: Chronic pain Pain Location: Shoulder Pain Orientation: Right Pain Descriptors / Indicators: Aching, Constant, Radiating, Sharp Pain Frequency: Constant  Deborah Blackwell comes in today for a follow-up visit after her initial evaluation on Visit date not found. Today we went over the results of her tests. These were explained in "Layman's terms". During today's appointment we went over my diagnostic impression, as well as the proposed treatment plan. Today I went over the results of her urine drug screen test in an attempt to figure out why she had methadone in the urine. The patient's worst that she has never had a prescription for methadone. In reviewing the [place] and multi state PMP (Prescription Monitoring Program) as well as that [place] going back to 04/25/2010, it is clear that the patient in fact has not received any prescriptions for methadone. This represents a significant issue since this would suggest that the medication was obtained illegally. Today the patient has attempted to dispute the results but I have explained to the patient that the type of urine drug screen test that we do is a 4 and 6 test that he qualitative but also one tentative and it is about 1000 times more accurate than gas chromatography and mass spectrometry. In addition, review of her PMP (Prescription Monitoring Program) reveals that she is being prescribed oxycodone and alprazolam but no all other opioid or benzodiazepine. However, the 10/18/2015 UDS shows that she has also been taking clonazepam and lorazepam. This again is of significant concern and because of this I will not be prescribing any opioids  for this patient. Clearly  she seems to have access to a lot more things than what she has been prescribed. It is this lack of control over the prescribed medications that makes me very nervous. Because of this, today I have limited her treatment plan to interventional therapies only.  In considering the treatment plan options, Deborah Blackwell was reminded that I no longer take patients for medication management only. I asked her to let me know if she had no intention of taking advantage of the interventional therapies, so that we could make arrangements to provide this space to someone interested. I also made it clear that undergoing interventional therapies for the purpose of getting pain medications is very inappropriate on the part of a patient, and it will not be tolerated in this practice. This type of behavior would suggest true addiction and therefore it requires referral to an addiction specialist.   Further details on both, my assessment(s), as well as the proposed treatment plan, please see below. Controlled Substance Pharmacotherapy Assessment REMS (Risk Evaluation and Mitigation Strategy)  Analgesic: Oxycodone 15 mg twice a day from Dr. Idelia Salm, last filled on 07/16/2015. MME/day: 45 mg/day Pharmacokinetics: N/A Pharmacodynamics: N/A Monitoring: [place] PMP: Online review of the past 36-monthperiod previously conducted. It is a great concern the medications found on her UDS cannot be found on her PMP (Prescription Monitoring Program) List of all Serum Drug Screening Test(s):  No results found for: AMPHSCRSER, BARBSCRSER, BENZOSCRSER, COCAINSCRSER, PCPSCRSER, THCSCRSER, OPIATESCRSER, OBethune PBarnumList of all UDS test(s) done:  Lab Results  Component Value Date   TOXASSSELUR FINAL 01/27/2015   SUMMARY FINAL 10/18/2015   Last UDS on record: ToxAssure Select 13  Date Value Ref Range Status  01/27/2015 FINAL  Final    Comment:    ==================================================================== TOXASSURE  SELECT 13 (MW) ==================================================================== Test                             Result       Flag       Units Drug Present and Declared for Prescription Verification   Alprazolam                     307          EXPECTED   ng/mg creat   Alpha-hydroxyalprazolam        774          EXPECTED   ng/mg creat    Source of alprazolam is a scheduled prescription medication.    Alpha-hydroxyalprazolam is an expected metabolite of alprazolam.   Oxycodone                      265          EXPECTED   ng/mg creat   Oxymorphone                    341          EXPECTED   ng/mg creat   Noroxycodone                   2637         EXPECTED   ng/mg creat   Noroxymorphone                 150          EXPECTED   ng/mg creat    Sources of oxycodone  are scheduled prescription medications.    Oxymorphone, noroxycodone, and noroxymorphone are expected    metabolites of oxycodone. Oxymorphone is also available as a    scheduled prescription medication. Drug Present not Declared for Prescription Verification   Methadone                      450          UNEXPECTED ng/mg creat   EDDP (Methadone Mtb)           348          UNEXPECTED ng/mg creat    Sources of methadone include scheduled prescription medications.    EDDP is an expected metabolite of methadone. ==================================================================== Test                      Result    Flag   Units      Ref Range   Creatinine              135              mg/dL      >=20 ==================================================================== Declared Medications:  The flagging and interpretation on this report are based on the  following declared medications.  Unexpected results may arise from  inaccuracies in the declared medications.  **Note: The testing scope of this panel includes these medications:  Alprazolam (Xanax)   Oxycodone ==================================================================== For clinical consultation, please call [phone]. ====================================================================    Summary  Date Value Ref Range Status  10/18/2015 FINAL  Final    Comment:    ==================================================================== TOXASSURE COMP DRUG ANALYSIS,UR ==================================================================== Test                             Result       Flag       Units Drug Present and Declared for Prescription Verification   Atenolol                       PRESENT      EXPECTED Drug Present not Declared for Prescription Verification   Alprazolam                     98           UNEXPECTED ng/mg creat   Alpha-hydroxyalprazolam        1094         UNEXPECTED ng/mg creat    Source of alprazolam is a scheduled prescription medication.    Alpha-hydroxyalprazolam is an expected metabolite of alprazolam.   Lorazepam                      922          UNEXPECTED ng/mg creat    Source of lorazepam is a scheduled prescription medication.   7-aminoclonazepam              33           UNEXPECTED ng/mg creat    7-aminoclonazepam is an expected metabolite of clonazepam. Source    of clonazepam is a scheduled prescription medication.   Noroxycodone                   63           UNEXPECTED ng/mg creat   Noroxymorphone  29           UNEXPECTED ng/mg creat    Noroxycodone and noroxymorphone are expected metabolites of    oxycodone. Noroxymorphone is an expected metabolite of    oxymorphone. Sources of oxycodone and/or oxymorphone are    scheduled prescription medications. ==================================================================== Test                      Result    Flag   Units      Ref Range   Creatinine              219              mg/dL      >=20 ==================================================================== Declared  Medications:  The flagging and interpretation on this report are based on the  following declared medications.  Unexpected results may arise from  inaccuracies in the declared medications.  **Note: The testing scope of this panel includes these medications:  Atenolol  **Note: The testing scope of this panel does not include following  reported medications:  Albuterol (Proventil)  Amlodipine  Atorvastatin  Budenoside (Symbicort)  Formoterol (Symbicort)  Levothyroxine  Losartan ==================================================================== For clinical consultation, please call [phone]. ====================================================================    UDS interpretation: Unexpected findings: Undeclared substances found on both UDS Medication Assessment Form: N/A Treatment compliance: Findings on 2 separate UDS are sufficient to raise significant concerns about the patient's use of controlled substances. Risk Assessment Profile: Aberrant behavior: unsafe use of medication Comorbid factors increasing risk of overdose: Concomitant use of Benzodiazepines Risk Mitigation Strategies:  Patient opioid safety counseling: Completed today. However, he means on the UDS are sufficient to warrant significant concern about the patient's use of controlled substances. Because of these concerns and the possible inability to control her medication intake, I have decided not to prescribe any controlled substances to this patient. Patient-Prescriber Agreement (PPA): No agreement signed  Controlled substance notification to other providers: None required. No opioid therapy  Pharmacologic Plan: Patient is not an appropriate candidate for opioid therapy at this time  Laboratory Chemistry  Inflammation Markers Lab Results  Component Value Date   CRP 1.6 (H) 10/20/2015   ESRSEDRATE 27 10/20/2015   (CRP: Acute Phase) (ESR: Chronic Phase) Renal Function Markers Lab Results  Component  Value Date   BUN 11 10/20/2015   CREATININE 0.77 10/20/2015   GFRAA >60 10/20/2015   GFRNONAA >60 10/20/2015   Hepatic Function Markers Lab Results  Component Value Date   AST 21 10/20/2015   ALT 16 10/20/2015   ALBUMIN 4.0 10/20/2015   ALKPHOS 88 10/20/2015   Electrolytes Lab Results  Component Value Date   NA 141 10/20/2015   K 3.3 (L) 10/20/2015   CL 107 10/20/2015   CALCIUM 9.3 10/20/2015   MG 2.0 10/20/2015   Neuropathy Markers Lab Results  Component Value Date   VITAMINB12 515 10/20/2015   Bone Pathology Markers Lab Results  Component Value Date   ALKPHOS 88 10/20/2015   25OHVITD1 12 (L) 10/20/2015   25OHVITD2 1.4 10/20/2015   25OHVITD3 11 10/20/2015   CALCIUM 9.3 10/20/2015   Coagulation Parameters Lab Results  Component Value Date   PLT 300 09/30/2015   Cardiovascular Markers Lab Results  Component Value Date   HGB 14.7 09/30/2015   HCT 44.3 09/30/2015   Note: Lab results reviewed and explained to patient in Layman's terms.  Recent Diagnostic Imaging Review  Dg Cervical Spine Complete Result Date: 10/20/2015 CLINICAL DATA:  Chronic pain. No  known injury. Initial evaluation . EXAM: CERVICAL SPINE - COMPLETE 4+ VIEW COMPARISON:  No recent prior. FINDINGS: Diffuse multilevel degenerative change. No acute abnormality identified. Ligamentous calcification. Diffuse osteopenia. IMPRESSION: Diffuse degenerative change.  No acute abnormality identified. Electronically Signed   By: Marcello Moores  Register   On: 10/20/2015 12:07   Dg Lumbar Spine Complete W/bend Result Date: 10/20/2015 CLINICAL DATA:  Chronic pain. Radiation down right leg. No known injury . EXAM: LUMBAR SPINE - COMPLETE WITH BENDING VIEWS COMPARISON:  MRI 10/30/2014. FINDINGS: Mild scoliosis concave right. Diffuse degenerative change. No acute bony abnormality identified. No evidence fracture. Diffuse osteopenia. Pelvic calcification consistent with phlebolith. Aortic atherosclerotic vascular  calcification IMPRESSION: 1. Diffuse degenerative change lumbar spine. No acute abnormality identified. Mild scoliosis concave right. No flexion or extension abnormality. 2.  Aortic atherosclerotic vascular disease. Electronically Signed   By: Marcello Moores  Register   On: 10/20/2015 12:09   Dg Shoulder Right Result Date: 10/20/2015 CLINICAL DATA:  Right shoulder pain.  No known injury. EXAM: RIGHT SHOULDER - 2+ VIEW COMPARISON:  No recent prior. FINDINGS: No acute bony or joint abnormality identified. No evidence of fracture or dislocation. IMPRESSION: No acute or focal abnormality. Electronically Signed   By: Marcello Moores  Register   On: 10/20/2015 12:11   Cervical Imaging: Cervical DG complete:  Results for orders placed during the hospital encounter of 10/20/15  DG Cervical Spine Complete   Narrative CLINICAL DATA:  Chronic pain. No known injury. Initial evaluation .  EXAM: CERVICAL SPINE - COMPLETE 4+ VIEW  COMPARISON:  No recent prior.  FINDINGS: Diffuse multilevel degenerative change. No acute abnormality identified. Ligamentous calcification. Diffuse osteopenia.  IMPRESSION: Diffuse degenerative change.  No acute abnormality identified.   Electronically Signed   By: Marcello Moores  Register   On: 10/20/2015 12:07    Shoulder Imaging: Gaston Islam DG:  Results for orders placed during the hospital encounter of 10/20/15  DG Shoulder Right   Narrative CLINICAL DATA:  Right shoulder pain.  No known injury.  EXAM: RIGHT SHOULDER - 2+ VIEW  COMPARISON:  No recent prior.  FINDINGS: No acute bony or joint abnormality identified. No evidence of fracture or dislocation.  IMPRESSION: No acute or focal abnormality.   Electronically Signed   By: Marcello Moores  Register   On: 10/20/2015 12:11    Lumbosacral Imaging: Lumbar MR wo contrast:  Results for orders placed during the hospital encounter of 10/30/14  MR Lumbar Spine Wo Contrast   Narrative CLINICAL DATA:  Low back pain  EXAM: MRI  LUMBAR SPINE WITHOUT CONTRAST  TECHNIQUE: Multiplanar, multisequence MR imaging of the lumbar spine was performed. No intravenous contrast was administered.  COMPARISON:  None.  FINDINGS: Normal lumbar alignment. Negative for fracture or mass lesion. Conus medullaris is normal and terminates at L1-2.  T11-12: Small central disc protrusion  T12-L1:  Negative  L1-2:  Negative  L2-3:  Negative  L3-4: Mild disc degeneration and mild facet degeneration without significant spinal or foraminal stenosis  L4-5: Mild disc degeneration. Small central disc protrusion without spinal or foraminal stenosis. Bilateral facet hypertrophy.  L5-S1: Small central disc protrusion. Bilateral facet hypertrophy. No significant foraminal stenosis.  IMPRESSION: Mild lumbar degenerative changes with small central disc protrusions at L4-5 and L5-S1. No significant neural impingement.   Electronically Signed   By: Franchot Gallo M.D.   On: 10/30/2014 14:31    Lumbar DG Bending views:  Results for orders placed during the hospital encounter of 10/20/15  DG Lumbar Spine Complete W/Bend   Narrative CLINICAL  DATA:  Chronic pain. Radiation down right leg. No known injury .  EXAM: LUMBAR SPINE - COMPLETE WITH BENDING VIEWS  COMPARISON:  MRI 10/30/2014.  FINDINGS: Mild scoliosis concave right. Diffuse degenerative change. No acute bony abnormality identified. No evidence fracture. Diffuse osteopenia. Pelvic calcification consistent with phlebolith. Aortic atherosclerotic vascular calcification  IMPRESSION: 1. Diffuse degenerative change lumbar spine. No acute abnormality identified. Mild scoliosis concave right. No flexion or extension abnormality.  2.  Aortic atherosclerotic vascular disease.   Electronically Signed   By: Marcello Moores  Register   On: 10/20/2015 12:09    Note: Results of ordered imaging test(s) reviewed and explained to patient in Layman's terms. Copy of results provided to  patient  Meds  The patient has a current medication list which includes the following prescription(s): albuterol, amlodipine, atenolol, atorvastatin, budesonide-formoterol, levothyroxine, losartan, and trazodone.  Current Outpatient Prescriptions on File Prior to Visit  Medication Sig  . albuterol (VENTOLIN HFA) 108 (90 BASE) MCG/ACT inhaler Inhale 2 puffs into the lungs every 6 (six) hours as needed for wheezing or shortness of breath.  Marland Kitchen amLODipine (NORVASC) 10 MG tablet Take 10 mg by mouth daily. Reported on 03/23/2015  . atenolol (TENORMIN) 50 MG tablet Take 1 tablet (50 mg total) by mouth 2 (two) times daily.  Marland Kitchen atorvastatin (LIPITOR) 40 MG tablet Take 40 mg by mouth at bedtime.  . budesonide-formoterol (SYMBICORT) 160-4.5 MCG/ACT inhaler Inhale 2 puffs into the lungs 2 (two) times daily.  Marland Kitchen levothyroxine (SYNTHROID, LEVOTHROID) 75 MCG tablet Take 1 tablet (75 mcg total) by mouth daily before breakfast.  . losartan (COZAAR) 100 MG tablet Take 1 tablet (100 mg total) by mouth daily.   No current facility-administered medications on file prior to visit.    ROS  Constitutional: Denies any fever or chills Gastrointestinal: No reported hemesis, hematochezia, vomiting, or acute GI distress Musculoskeletal: Denies any acute onset joint swelling, redness, loss of ROM, or weakness Neurological: No reported episodes of acute onset apraxia, aphasia, dysarthria, agnosia, amnesia, paralysis, loss of coordination, or loss of consciousness  Allergies  Ms. Claus is allergic to contrast media [iodinated diagnostic agents]; aspirin; and macrobid [nitrofurantoin monohyd macro].  PFSH  Drug: Ms. Masoner  reports that she does not use drugs. Alcohol:  reports that she does not drink alcohol. Tobacco:  reports that she quit smoking about 25 years ago. Her smoking use included Cigarettes. She has a 10.00 pack-year smoking history. She has never used smokeless tobacco. Medical:  has a past medical history of  Angina at rest [facility]); Arthritis; Asthma; Back pain with right-sided sciatica (09/30/2014); Bipolar disorder ([place]); Congenital scoliosis; COPD (chronic obstructive pulmonary disease) ([place]); DDD (degenerative disc disease), lumbar; Depression; Diabetes mellitus without complication ([place]); GERD (gastroesophageal reflux disease); Gout; History of kidney stones; History of scoliosis; Hyperlipidemia; Hypertension; Lumbar radiculopathy (09/30/2014); Migraines; Panic attack; and RA (rheumatoid arthritis) ([place]). Family: family history includes Alzheimer's disease in her father and mother; Cancer in her brother and mother; Dementia in her father and mother; Depression in her mother; Heart disease in her mother; Hypertension in her father, mother, and sister; Lung disease in her brother.  Past Surgical History:  Procedure Laterality Date  . ABDOMINAL HYSTERECTOMY  2003   one ovary remains: due to heavy bleeding/endometrioma/cysts  . BACK SURGERY     and injections  . CARPAL TUNNEL RELEASE    . FOOT SURGERY Right    bone spur  . POLYPECTOMY     uterine   Constitutional Exam  General appearance: Well  nourished, well developed, and well hydrated. In no apparent acute distress Vitals:   05/02/16 1152  BP: 126/70  Pulse: (!) 106  Resp: 16  Temp: 98 F (36.7 C)  TempSrc: Oral  SpO2: 98%  Weight: 120 lb (54.4 kg)  Height: 5' 1" (1.549 m)   BMI Assessment: Estimated body mass index is 22.67 kg/m as calculated from the following:   Height as of this encounter: 5' 1" (1.549 m).   Weight as of this encounter: 120 lb (54.4 kg).  BMI interpretation table: BMI level Category Range association with higher incidence of chronic pain  <18 kg/m2 Underweight   18.5-24.9 kg/m2 Ideal body weight   25-29.9 kg/m2 Overweight Increased incidence by 20%  30-34.9 kg/m2 Obese (Class I) Increased incidence by 68%  35-39.9 kg/m2 Severe obesity (Class II) Increased incidence by 136%  >40 kg/m2 Extreme obesity (Class  III) Increased incidence by 254%   BMI Readings from Last 4 Encounters:  05/02/16 22.67 kg/m  10/18/15 24.56 kg/m  09/30/15 24.92 kg/m  07/16/15 24.94 kg/m   Wt Readings from Last 4 Encounters:  05/02/16 120 lb (54.4 kg)  10/18/15 130 lb (59 kg)  09/30/15 131 lb 14.4 oz (59.8 kg)  07/16/15 132 lb (59.9 kg)  Psych/Mental status: Alert, oriented x 3 (person, place, & time)       Eyes: PERLA Respiratory: No evidence of acute respiratory distress  Cervical Spine Exam  Inspection: No masses, redness, or swelling Alignment: Symmetrical Functional ROM: Unrestricted ROM Stability: No instability detected Muscle strength & Tone: Functionally intact Sensory: Unimpaired Palpation: No palpable anomalies  Upper Extremity (UE) Exam    Side: Right upper extremity  Side: Left upper extremity  Inspection: No masses, redness, swelling, or asymmetry. No contractures  Inspection: No masses, redness, swelling, or asymmetry. No contractures  Functional ROM: Unrestricted ROM          Functional ROM: Unrestricted ROM          Muscle strength & Tone: Functionally intact  Muscle strength & Tone: Functionally intact  Sensory: Unimpaired  Sensory: Unimpaired  Palpation: No palpable anomalies  Palpation: No palpable anomalies  Specialized Test(s): Deferred         Specialized Test(s): Deferred          Thoracic Spine Exam  Inspection: No masses, redness, or swelling Alignment: Symmetrical Functional ROM: Unrestricted ROM Stability: No instability detected Sensory: Unimpaired Muscle strength & Tone: No palpable anomalies  Lumbar Spine Exam  Inspection: No masses, redness, or swelling Alignment: Symmetrical Functional ROM: Limited ROM Stability: No instability detected Muscle strength & Tone: Functionally intact Sensory: Movement-associated pain Palpation: Complains of area being tender to palpation Provocative Tests: Lumbar Hyperextension and rotation test: Positive bilaterally for facet  joint pain. Patrick's Maneuver: evaluation deferred today              Gait & Posture Assessment  Ambulation: Patient ambulates using a walker Gait: Very limited, using assistive device to ambulate Posture: Antalgic   Lower Extremity Exam    Side: Right lower extremity  Side: Left lower extremity  Inspection: No masses, redness, swelling, or asymmetry. No contractures  Inspection: No masses, redness, swelling, or asymmetry. No contractures  Functional ROM: Unrestricted ROM          Functional ROM: Unrestricted ROM          Muscle strength & Tone: Functionally intact  Muscle strength & Tone: Functionally intact  Sensory: Unimpaired  Sensory: Unimpaired  Palpation: No palpable anomalies  Palpation:  No palpable anomalies   Assessment & Plan  Primary Diagnosis & Pertinent Problem List: The primary encounter diagnosis was Chronic low back pain (Location of Primary Source of Pain) (Right). Diagnoses of Chronic lower extremity pain (Location of Secondary source of pain) (Right), Lumbar facet syndrome (Location of Primary Source of Pain) (Right), Lumbar spondylosis, Chronic neck pain (Location of Tertiary source of pain) (Right), Vitamin D deficiency, Long term prescription opiate use, Long term current use of opiate analgesic, and Opiate use were also pertinent to this visit.  Visit Diagnosis: 1. Chronic low back pain (Location of Primary Source of Pain) (Right)   2. Chronic lower extremity pain (Location of Secondary source of pain) (Right)   3. Lumbar facet syndrome (Location of Primary Source of Pain) (Right)   4. Lumbar spondylosis   5. Chronic neck pain (Location of Tertiary source of pain) (Right)   6. Vitamin D deficiency   7. Long term prescription opiate use   8. Long term current use of opiate analgesic   9. Opiate use    Problems updated and reviewed during this visit: No problems updated. Problem-specific Plan(s): No problem-specific Assessment & Plan notes found for this  encounter.  Assessment & plan notes cannot be loaded without a specified hospital service.  Plan of Care  Pharmacotherapy (Medications Ordered): No orders of the defined types were placed in this encounter.  Lab-work, procedure(s), and/or referral(s): Orders Placed This Encounter  Procedures  . LUMBAR FACET(MEDIAL BRANCH NERVE BLOCK) MBNB  . ToxASSURE Select 13 (MW), Urine    Pharmacotherapy: Opioid Analgesics: Not an appropriate candidate for opioid therapy Membrane stabilizer: None prescribed at this time Muscle relaxant: None prescribed at this time NSAID: None prescribed at this time Other analgesic(s): None prescribed at this time   Interventional therapies: Planned, scheduled, and/or pending:    None at this time. The patient indicated that she wanted to discuss her options with her husband before deciding.    Considering:   Diagnostic Bilateral Lumbar Facet Block  Possible bilateral lumbar facet RFA. Diagnostic right intra-articular shoulder joint injection  Possible diagnostic right-sided suprascapular nerve block  Possible right-sided suprascapular nerve RFA. Right-sided diagnostic CESI  Diagnostic GONB Possible right-sided occipital nerve RFA    PRN Procedures:   Diagnostic Bilateral Lumbar Facet Block    Provider-requested follow-up: Return if symptoms worsen or fail to improve, for (PRN) procedure.  No future appointments.  Primary Care Physician: Arnetha Courser, MD Location: [facility] Note by: Kathlen Brunswick Dossie Arbour, M.D, DABA, DABAPM, DABPM, DABIPP, FIPP Date: 05/02/2016; Time: 5:54 PM  Pain Score Disclaimer: We use the NRS-11 scale. This is a self-reported, subjective measurement of pain severity with only modest accuracy. It is used primarily to identify changes within a particular patient. It must be understood that outpatient pain scales are significantly less accurate that those used for research, where they can be  applied under ideal controlled circumstances with minimal exposure to variables. In reality, the score is likely to be a combination of pain intensity and pain affect, where pain affect describes the degree of emotional arousal or changes in action readiness caused by the sensory experience of pain. Factors such as social and work situation, setting, emotional state, anxiety levels, expectation, and prior pain experience may influence pain perception and show large inter-individual differences that may also be affected by time variables.  Patient instructions provided during this appointment: Patient Instructions  Preparing for Procedure with Sedation Instructions: . Oral Intake: Do not eat  or drink anything for at least 8 hours prior to your procedure. . Transportation: Public transportation is not allowed. Bring an adult driver. The driver must be physically present in our waiting room before any procedure can be started. Marland Kitchen Physical Assistance: Bring an adult capable of physically assisting you, in the event you need help. . Blood Pressure Medicine: Take your blood pressure medicine with a sip of water the morning of the procedure. . Insulin: Take only  of your normal insulin dose. . Preventing infections: Shower with an antibacterial soap the morning of your procedure. . Build-up your immune system: Take 1000 mg of Vitamin C with every meal (3 times a day) the day prior to your procedure. . Pregnancy: If you are pregnant, call and cancel the procedure. . Sickness: If you have a cold, fever, or any active infections, call and cancel the procedure. . Arrival: You must be in the facility at least 30 minutes prior to your scheduled procedure. . Children: Do not bring children with you. . Dress appropriately: Bring dark clothing that you would not mind if they get stained. . Valuables: Do not bring any jewelry or valuables. Procedure appointments are reserved for interventional treatments  only. Marland Kitchen No Prescription Refills. . No medication changes will be discussed during procedure appointments. No disability issues will be discussed.Facet Blocks Patient Information  Description: The facets are joints in the spine between the vertebrae.  Like any joints in the body, facets can become irritated and painful.  Arthritis can also effect the facets.  By injecting steroids and local anesthetic in and around these joints, we can temporarily block the nerve supply to them.  Steroids act directly on irritated nerves and tissues to reduce selling and inflammation which often leads to decreased pain.  Facet blocks may be done anywhere along the spine from the neck to the low back depending upon the location of your pain.   After numbing the skin with local anesthetic (like Novocaine), a small needle is passed onto the facet joints under x-ray guidance.  You may experience a sensation of pressure while this is being done.  The entire block usually lasts about 15-25 minutes.   Conditions which may be treated by facet blocks:  Low back/buttock pain Neck/shoulder pain Certain types of headaches  Preparation for the injection:  Do not eat any solid food or dairy products within 8 hours of your appointment. You may drink clear liquid up to 3 hours before appointment.  Clear liquids include water, black coffee, juice or soda.  No milk or cream please. You may take your regular medication, including pain medications, with a sip of water before your appointment.  Diabetics should hold regular insulin (if taken separately) and take 1/2 normal NPH dose the morning of the procedure.  Carry some sugar containing items with you to your appointment. A driver must accompany you and be prepared to drive you home after your procedure. Bring all your current medications with you. An IV may be inserted and sedation may be given at the discretion of the physician. A blood pressure cuff, EKG and other monitors  will often be applied during the procedure.  Some patients may need to have extra oxygen administered for a short period. You will be asked to provide medical information, including your allergies and medications, prior to the procedure.  We must know immediately if you are taking blood thinners (like Coumadin/Warfarin) or if you are allergic to IV iodine contrast (dye).  We must  know if you could possible be pregnant.  Possible side-effects:  Bleeding from needle site Infection (rare, may require surgery) Nerve injury (rare) Numbness & tingling (temporary) Difficulty urinating (rare, temporary) Spinal headache (a headache worse with upright posture) Light-headedness (temporary) Pain at injection site (serveral days) Decreased blood pressure (rare, temporary) Weakness in arm/leg (temporary) Pressure sensation in back/neck (temporary)   Call if you experience:  Fever/chills associated with headache or increased back/neck pain Headache worsened by an upright position New onset, weakness or numbness of an extremity below the injection site Hives or difficulty breathing (go to the emergency room) Inflammation or drainage at the injection site(s) Severe back/neck pain greater than usual New symptoms which are concerning to you  Please note:  Although the local anesthetic injected can often make your back or neck feel good for several hours after the injection, the pain will likely return. It takes 3-7 days for steroids to work.  You may not notice any pain relief for at least one week.  If effective, we will often do a series of 2-3 injections spaced 3-6 weeks apart to maximally decrease your pain.  After the initial series, you may be a candidate for a more permanent nerve block of the facets.  If you have any questions, please call #[phone] . [facility]

## 2016-05-03 ENCOUNTER — Telehealth: Payer: Self-pay | Admitting: *Deleted

## 2016-05-03 NOTE — Telephone Encounter (Signed)
Attempted to call patient, message left. 

## 2016-05-03 NOTE — Telephone Encounter (Signed)
Patient call back, she in unclear what Dr. Dossie Arbour discussed with her yesterday at her appointment.Marland Kitchen Spoke with Dr. Dossie Arbour about this, he explained what he told her, and I relayed this information to patient. She requested that her x-ray and lab results be mailed to her. These results were mailed.

## 2016-05-08 LAB — TOXASSURE SELECT 13 (MW), URINE

## 2016-06-09 NOTE — Telephone Encounter (Signed)
RRENOUS

## 2016-08-01 DIAGNOSIS — E113393 Type 2 diabetes mellitus with moderate nonproliferative diabetic retinopathy without macular edema, bilateral: Secondary | ICD-10-CM | POA: Diagnosis not present

## 2016-08-01 LAB — HM DIABETES EYE EXAM

## 2017-01-01 ENCOUNTER — Telehealth: Payer: Self-pay

## 2017-01-01 NOTE — Telephone Encounter (Signed)
We have an opening today at 9:20 or 9:40 if pt would like to do that?   Copied from [place]. Topic: Appointment Scheduling - Scheduling Inquiry for Clinic >> Dec 29, 2016 12:49 PM Percell Belt A wrote: Reason for CRM:   Pt called in and she wanted to make an appt with Dr Sanda Klein.  Her Husband pass away and she is not doing very well.  Pt was upset and crying on the phone.  Dr Sanda Klein 1st available was tues.  Pt said she was ok with that but I was just concerned for pt and if pt could get in today or Monday if that was a possibility

## 2017-01-01 NOTE — Telephone Encounter (Signed)
LFT MESS THAT DR HAS 1 APPT AT 9:40 AM 01-01-17

## 2017-01-02 ENCOUNTER — Encounter: Payer: Self-pay | Admitting: Family Medicine

## 2017-01-02 ENCOUNTER — Ambulatory Visit (INDEPENDENT_AMBULATORY_CARE_PROVIDER_SITE_OTHER): Payer: Medicare Other | Admitting: Family Medicine

## 2017-01-02 VITALS — BP 138/86 | HR 89 | Temp 98.4°F | Resp 16 | Wt 138.9 lb

## 2017-01-02 DIAGNOSIS — E1140 Type 2 diabetes mellitus with diabetic neuropathy, unspecified: Secondary | ICD-10-CM

## 2017-01-02 DIAGNOSIS — E782 Mixed hyperlipidemia: Secondary | ICD-10-CM

## 2017-01-02 DIAGNOSIS — R0600 Dyspnea, unspecified: Secondary | ICD-10-CM

## 2017-01-02 DIAGNOSIS — R9431 Abnormal electrocardiogram [ECG] [EKG]: Secondary | ICD-10-CM | POA: Diagnosis not present

## 2017-01-02 DIAGNOSIS — E559 Vitamin D deficiency, unspecified: Secondary | ICD-10-CM

## 2017-01-02 DIAGNOSIS — M109 Gout, unspecified: Secondary | ICD-10-CM | POA: Diagnosis not present

## 2017-01-02 DIAGNOSIS — E039 Hypothyroidism, unspecified: Secondary | ICD-10-CM | POA: Diagnosis not present

## 2017-01-02 DIAGNOSIS — Z765 Malingerer [conscious simulation]: Secondary | ICD-10-CM | POA: Diagnosis not present

## 2017-01-02 DIAGNOSIS — F4321 Adjustment disorder with depressed mood: Secondary | ICD-10-CM

## 2017-01-02 DIAGNOSIS — I10 Essential (primary) hypertension: Secondary | ICD-10-CM | POA: Diagnosis not present

## 2017-01-02 LAB — POCT GLYCOSYLATED HEMOGLOBIN (HGB A1C): Hemoglobin A1C: 5.7

## 2017-01-02 MED ORDER — ALBUTEROL SULFATE HFA 108 (90 BASE) MCG/ACT IN AERS: 2.0000 | INHALATION_SPRAY | RESPIRATORY_TRACT | 0 refills | Status: DC | PRN

## 2017-01-02 MED ORDER — LEVOTHYROXINE SODIUM 50 MCG PO TABS: ORAL_TABLET | ORAL | 0 refills | Status: DC

## 2017-01-02 MED ORDER — LOSARTAN POTASSIUM 50 MG PO TABS: 50.0000 mg | ORAL_TABLET | Freq: Every day | ORAL | 0 refills | Status: DC

## 2017-01-02 MED ORDER — SERTRALINE HCL 50 MG PO TABS: ORAL_TABLET | ORAL | 0 refills | Status: DC

## 2017-01-02 MED ORDER — TRAZODONE HCL 50 MG PO TABS
50.0000 mg | ORAL_TABLET | Freq: Every evening | ORAL | 0 refills | Status: DC | PRN
Start: 2017-01-02 — End: 2017-01-05

## 2017-01-02 NOTE — Assessment & Plan Note (Signed)
Check lipids 

## 2017-01-02 NOTE — Patient Instructions (Addendum)
Please do work with a Hospice grief counselor Start back on the sertraline Start back on the thyroid medicine Start back on some blood pressure medicine, but those are both lower doses than before Start back on the medicine for sleep   Living With Anxiety After being diagnosed with an anxiety disorder, you may be relieved to know why you have felt or behaved a certain way. It is natural to also feel overwhelmed about the treatment ahead and what it will mean for your life. With care and support, you can manage this condition and recover from it. How to cope with anxiety Dealing with stress Stress is your body's reaction to life changes and events, both good and bad. Stress can last just a few hours or it can be ongoing. Stress can play a major role in anxiety, so it is important to learn both how to cope with stress and how to think about it differently. Talk with your health care provider or a counselor to learn more about stress reduction. He or she may suggest some stress reduction techniques, such as:  Music therapy. This can include creating or listening to music that you enjoy and that inspires you.  Mindfulness-based meditation. This involves being aware of your normal breaths, rather than trying to control your breathing. It can be done while sitting or walking.  Centering prayer. This is a kind of meditation that involves focusing on a word, phrase, or sacred image that is meaningful to you and that brings you peace.  Deep breathing. To do this, expand your stomach and inhale slowly through your nose. Hold your breath for 3-5 seconds. Then exhale slowly, allowing your stomach muscles to relax.  Self-talk. This is a skill where you identify thought patterns that lead to anxiety reactions and correct those thoughts.  Muscle relaxation. This involves tensing muscles then relaxing them.  Choose a stress reduction technique that fits your lifestyle and personality. Stress reduction  techniques take time and practice. Set aside 5-15 minutes a day to do them. Therapists can offer training in these techniques. The training may be covered by some insurance plans. Other things you can do to manage stress include:  Keeping a stress diary. This can help you learn what triggers your stress and ways to control your response.  Thinking about how you respond to certain situations. You may not be able to control everything, but you can control your reaction.  Making time for activities that help you relax, and not feeling guilty about spending your time in this way.  Therapy combined with coping and stress-reduction skills provides the best chance for successful treatment. Medicines Medicines can help ease symptoms. Medicines for anxiety include:  Anti-anxiety drugs.  Antidepressants.  Beta-blockers.  Medicines may be used as the main treatment for anxiety disorder, along with therapy, or if other treatments are not working. Medicines should be prescribed by a health care provider. Relationships Relationships can play a big part in helping you recover. Try to spend more time connecting with trusted friends and family members. Consider going to couples counseling, taking family education classes, or going to family therapy. Therapy can help you and others better understand the condition. How to recognize changes in your condition Everyone has a different response to treatment for anxiety. Recovery from anxiety happens when symptoms decrease and stop interfering with your daily activities at home or work. This may mean that you will start to:  Have better concentration and focus.  Sleep better.  Be  less irritable.  Have more energy.  Have improved memory.  It is important to recognize when your condition is getting worse. Contact your health care provider if your symptoms interfere with home or work and you do not feel like your condition is improving. Where to find help  and support: You can get help and support from these sources:  Self-help groups.  Online and [employer].  A trusted spiritual leader.  Couples counseling.  Family education classes.  Family therapy.  Follow these instructions at home:  Eat a healthy diet that includes plenty of vegetables, fruits, whole grains, low-fat dairy products, and lean protein. Do not eat a lot of foods that are high in solid fats, added sugars, or salt.  Exercise. Most adults should do the following: ? Exercise for at least 150 minutes each week. The exercise should increase your heart rate and make you sweat (moderate-intensity exercise). ? Strengthening exercises at least twice a week.  Cut down on caffeine, tobacco, alcohol, and other potentially harmful substances.  Get the right amount and quality of sleep. Most adults need 7-9 hours of sleep each night.  Make choices that simplify your life.  Take over-the-counter and prescription medicines only as told by your health care provider.  Avoid caffeine, alcohol, and certain over-the-counter cold medicines. These may make you feel worse. Ask your pharmacist which medicines to avoid.  Keep all follow-up visits as told by your health care provider. This is important. Questions to ask your health care provider  Would I benefit from therapy?  How often should I follow up with a health care provider?  How long do I need to take medicine?  Are there any long-term side effects of my medicine?  Are there any alternatives to taking medicine? Contact a health care provider if:  You have a hard time staying focused or finishing daily tasks.  You spend many hours a day feeling worried about everyday life.  You become exhausted by worry.  You start to have headaches, feel tense, or have nausea.  You urinate more than normal.  You have diarrhea. Get help right away if:  You have a racing heart and shortness of breath.  You have  thoughts of hurting yourself or others. If you ever feel like you may hurt yourself or others, or have thoughts about taking your own life, get help right away. You can go to your nearest emergency department or call:  Your local emergency services (911 in the [place]  A suicide crisis helpline, such as the [facility] at 585-052-4054. This is open 24-hours a day.  Summary  Taking steps to deal with stress can help calm you.  Medicines cannot cure anxiety disorders, but they can help ease symptoms.  Family, friends, and partners can play a big part in helping you recover from an anxiety disorder. This information is not intended to replace advice given to you by your health care provider. Make sure you discuss any questions you have with your health care provider. Document Released: 12/28/2015 Document Revised: 12/28/2015 Document Reviewed: 12/28/2015 Elsevier Interactive Patient Education  2018 [employer].  Complicated Grieving Grief is a normal response to the death of someone close to you. Feelings of fear, anger, and guilt can affect almost everyone who loses a loved one. It is also common to have symptoms of depression while you are grieving. These include problems with sleep, loss of appetite, and lack of energy. They may last for weeks or months  after a loss. Complicated grief is different from normal grief or depression. Normal grieving involves sadness and feelings of loss, but these feelings are not constant. Complicated grief is a constant and severe type of grief. It interferes with your ability to function normally. It may last for several months to a year or longer. Complicated grief may require treatment from a mental health care provider. What are the causes? It is not known why some people continue to struggle with grief and others do not. You may be at higher risk for complicated grief if:  The death of your loved one was sudden or  unexpected.  The death of your loved one was due to a violent event.  Your loved one committed suicide.  Your loved one was a child or a young person.  You were very close to or dependent on the loved one.  You have a history of depression.  What are the signs or symptoms? Signs and symptoms of complicated grief may include:  Feeling disbelief or numbness.  Being unable to enjoy good memories of your loved one.  Needing to avoid anything that reminds you of your loved one.  Being unable to stop thinking about the death.  Feeling intense anger or guilt.  Feeling alone and hopeless.  Feeling that your life is meaningless and empty.  Losing the desire to live.  How is this diagnosed? Your health care provider may diagnose complicated grief if:  You have constant symptoms of grief for 6-12 months or longer.  Your symptoms are interfering with your ability to live your life.  Your health care provider may want you to see a mental health care provider. Many symptoms of depression are similar to the symptoms of complicated grief. It is important to be evaluated for complicated grief along with other mental health conditions. How is this treated? Talk therapy with a mental health provider is the most common treatment for complicated grief. During therapy, you will learn healthy ways to cope with the loss of your loved one. In some cases, your mental health care provider may also recommend antidepressant medicines. Follow these instructions at home:  Take care of yourself. ? Eat regular meals and maintain a healthy diet. Eat plenty of fruits, vegetables, and whole grains. ? Try to get some exercise each day. ? Keep regular hours for sleep. Try to get at least 8 hours of sleep each night.  Do not use drugs or alcohol to ease your symptoms.  Take medicines only as directed by your health care provider.  Spend time with friends and loved ones.  Consider joining a grief  (bereavement) support group to help you deal with your loss.  Keep all follow-up visits as directed by your health care provider. This is important. Contact a health care provider if:  Your symptoms keep you from functioning normally.  Your symptoms do not get better with treatment. Get help right away if:  You have serious thoughts of hurting yourself or someone else.  You have suicidal feelings. This information is not intended to replace advice given to you by your health care provider. Make sure you discuss any questions you have with your health care provider. Document Released: 01/02/2005 Document Revised: 06/10/2015 Document Reviewed: 06/12/2013 Elsevier Interactive Patient Education  [employer].

## 2017-01-02 NOTE — Assessment & Plan Note (Addendum)
Check in-house A1c; patient has been away from this office for over a year; I'm glad that she is back; will get labs today; foot exam by MD; close f/u in 2 weeks; start back on ARB; will see what labs show and most likely start her back on statin; she cannot take aspirin she says

## 2017-01-02 NOTE — Assessment & Plan Note (Signed)
Check uric acid. 

## 2017-01-02 NOTE — Assessment & Plan Note (Signed)
Not controlled; start back on medicine

## 2017-01-02 NOTE — Assessment & Plan Note (Signed)
Will not treat her with any benzodiazepines given her history, abnormal UDS; after prescribing the sertraline, she asked if I was going to give her anything for her anxiety; I explained that the sertraline would be helping that; no benzos from this office

## 2017-01-02 NOTE — Assessment & Plan Note (Signed)
Check level 

## 2017-01-02 NOTE — Assessment & Plan Note (Signed)
Check TSH 

## 2017-01-02 NOTE — Progress Notes (Signed)
BP 138/86   Pulse 89   Temp 98.4 F (36.9 C) (Oral)   Resp 16   Wt 138 lb 14.4 oz (63 kg)   SpO2 99%   BMI 26.24 kg/m    Subjective:    Patient ID: Deborah Blackwell, female    DOB: 1960/07/01, 56 y.o.   MRN: [id number]  HPI: Deborah Blackwell is a 56 y.o. female  Chief Complaint  Patient presents with  . Depression    anxiety and insomnia, husband passed away due to lung and heart failure  . Medication Refill    pt not currently taking any meds due to not being able to come to the doctore because of taking care of husband   HPI Patient is her for an acute visit Her husband died on January 09, 2023 She is going to see a grief counselor on Friday (today is Monday); that is through her living facility, [facility] Ever since this happened; trouble breathing, constantly for 3 weeks; it happens when she starts crying, feels like she can't breathe well; does not think it's her heart She quit taking her atenolol She has not been taking her medicines; no thyroid medicine, has gained 18+ pounds since last visit Her last A1c was in September of 2017; A1c was 5.5 Used to take zoloft, none in about two years She quit taking her thyroid medicine about a year ago; not aware of any family with thyroid disease Unable to take aspirin Watching what she eats for diabetes High cholesterol; avoiding fatty foods High blood pressure  Depression screen [facility] 2/9 01/02/2017 05/02/2016 10/18/2015 09/30/2015 07/16/2015  Decreased Interest 3 0 0 0 0  Down, Depressed, Hopeless 3 0 0 1 0  PHQ - 2 Score 6 0 0 1 0  Altered sleeping 3 - - - -  Tired, decreased energy 3 - - - -  Change in appetite 3 - - - -  Feeling bad or failure about yourself  1 - - - -  Trouble concentrating 3 - - - -  Moving slowly or fidgety/restless 0 - - - -  Suicidal thoughts 0 - - - -  PHQ-9 Score 19 - - - -  Difficult doing work/chores Very difficult - - - -  No SI/HI  Relevant past medical, surgical, family and social history  reviewed Past Medical History:  Diagnosis Date  . Angina at rest [facility])   . Arthritis   . Asthma   . Back pain with right-sided sciatica 09/30/2014  . Bipolar disorder ([place])   . Congenital scoliosis   . COPD (chronic obstructive pulmonary disease) ([place])   . DDD (degenerative disc disease), lumbar   . Depression   . Diabetes mellitus without complication ([place])   . GERD (gastroesophageal reflux disease)   . Gout   . History of kidney stones   . History of scoliosis    was casted as a child  . Hyperlipidemia   . Hypertension   . Lumbar radiculopathy 09/30/2014  . Migraines   . Panic attack   . RA (rheumatoid arthritis) ([place])    Past Surgical History:  Procedure Laterality Date  . ABDOMINAL HYSTERECTOMY  2003   one ovary remains: due to heavy bleeding/endometrioma/cysts  . BACK SURGERY     and injections  . CARPAL TUNNEL RELEASE    . FOOT SURGERY Right    bone spur  . POLYPECTOMY     uterine   Family History  Problem Relation Age of Onset  .  Alzheimer's disease Mother   . Dementia Mother   . Cancer Mother        colon  . Heart disease Mother   . Hypertension Mother   . Depression Mother   . Alzheimer's disease Father   . Dementia Father   . Hypertension Father   . Cancer Brother        lung  . Lung disease Brother   . Hypertension Sister    Social History   Tobacco Use  . Smoking status: Former Smoker    Packs/day: 2.00    Years: 5.00    Pack years: 10.00    Types: Cigarettes    Last attempt to quit: 02/02/1991    Years since quitting: 25.9  . Smokeless tobacco: Never Used  Substance Use Topics  . Alcohol use: No    Alcohol/week: 0.0 oz  . Drug use: No    Interim medical history since last visit reviewed. Allergies and medications reviewed  Review of Systems Per HPI unless specifically indicated above     Objective:    BP 138/86   Pulse 89   Temp 98.4 F (36.9 C) (Oral)   Resp 16   Wt 138 lb 14.4 oz (63 kg)   SpO2 99%   BMI 26.24 kg/m     Wt Readings from Last 3 Encounters:  01/02/17 138 lb 14.4 oz (63 kg)  05/02/16 120 lb (54.4 kg)  10/18/15 130 lb (59 kg)    Physical Exam  Constitutional: She appears well-developed and well-nourished. No distress.  HENT:  Head: Normocephalic and atraumatic.  Eyes: No scleral icterus.  Neck: No JVD present.  Cardiovascular: Normal rate and regular rhythm.  Pulmonary/Chest: Effort normal and breath sounds normal.  Abdominal: Soft. She exhibits no distension.  Neurological: She is alert.  Skin: Skin is warm. She is not diaphoretic. No pallor.  Psychiatric: Her mood appears anxious. Her affect is not angry and not inappropriate. Her speech is not rapid and/or pressured and not delayed. She is not agitated, not hyperactive, not slowed and not withdrawn. Cognition and memory are not impaired. She does not express impulsivity. She exhibits a depressed mood. She expresses no homicidal and no suicidal ideation.  Crying, tearful She is attentive.   Diabetic Foot Form - Detailed   Diabetic Foot Exam - detailed Diabetic Foot exam was performed with the following findings:  Yes 01/02/2017  1:56 PM  Visual Foot Exam completed.:  Yes  Pulse Foot Exam completed.:  Yes  Right Dorsalis Pedis:  Present Left Dorsalis Pedis:  Present  Sensory Foot Exam Completed.:  Yes Semmes-Weinstein Monofilament Test R Site 1-Great Toe:  Pos L Site 1-Great Toe:  Pos        Results for orders placed or performed in visit on 01/02/17  POCT HgB A1C  Result Value Ref Range   Hemoglobin A1C 5.7       Assessment & Plan:   Problem List Items Addressed This Visit      Cardiovascular and Mediastinum   Hypertension (Chronic)    Not controlled; start back on medicine      Relevant Medications   losartan (COZAAR) 50 MG tablet     Endocrine   Type 2 diabetes, controlled, with neuropathy (HCC)    Check in-house A1c; patient has been away from this office for over a year; I'm glad that she is back; will get  labs today; foot exam by MD; close f/u in 2 weeks; start back on ARB; will see  what labs show and most likely start her back on statin; she cannot take aspirin she says      Relevant Medications   losartan (COZAAR) 50 MG tablet   Other Relevant Orders   POCT HgB A1C (Completed)   Lipid panel   Hemoglobin A1c   COMPLETE METABOLIC PANEL WITH GFR   Microalbumin / creatinine urine ratio   Hypothyroidism (Chronic)    Check TSH      Relevant Medications   levothyroxine (SYNTHROID, LEVOTHROID) 50 MCG tablet   Other Relevant Orders   TSH     Other   Vitamin D deficiency    Check level      Relevant Orders   VITAMIN D 25 Hydroxy (Vit-D Deficiency, Fractures)   Hyperlipidemia    Check lipids      Relevant Medications   losartan (COZAAR) 50 MG tablet   Other Relevant Orders   Lipid panel   Gout (Chronic)    Check uric acid      Relevant Orders   Uric acid   Drug-seeking behavior    Will not treat her with any benzodiazepines given her history, abnormal UDS; after prescribing the sertraline, she asked if I was going to give her anything for her anxiety; I explained that the sertraline would be helping that; no benzos from this office       Other Visit Diagnoses    Grief reaction    -  Primary   encouraged couseling; start back on sertraline; she has the number for Hospice grief counselors; no SI/HI   Dyspnea, unspecified type       with chest heaviness x 3 weeks associated with crying; EKG reviewed today; no ST-T wave changes to suggest ischemia; will get ECHO; call 911 if needed   Relevant Orders   EKG 12-Lead   CBC with Differential/Platelet   Abnormal EKG       suggestive of RAA and LAA, but no changes suggestive of ischemia; will get echocardiogram   Relevant Orders   ECHOCARDIOGRAM COMPLETE       Follow up plan: Return in about 2 weeks (around 01/16/2017) for follow-up visit with Dr. Sanda Klein.  An after-visit summary was printed and given to the patient at  [place].  Please see the patient instructions which may contain other information and recommendations beyond what is mentioned above in the assessment and plan.  Meds ordered this encounter  Medications  . albuterol (VENTOLIN HFA) 108 (90 Base) MCG/ACT inhaler    Sig: Inhale 2 puffs into the lungs every 4 (four) hours as needed for wheezing or shortness of breath.    Dispense:  1 Inhaler    Refill:  0  . sertraline (ZOLOFT) 50 MG tablet    Sig: One by mouth daily    Dispense:  30 tablet    Refill:  0  . traZODone (DESYREL) 50 MG tablet    Sig: Take 1 tablet (50 mg total) by mouth at bedtime as needed.    Dispense:  30 tablet    Refill:  0  . levothyroxine (SYNTHROID, LEVOTHROID) 50 MCG tablet    Sig: Take half of a pill daily for six days, then one whole pill    Dispense:  27 tablet    Refill:  0  . losartan (COZAAR) 50 MG tablet    Sig: Take 1 tablet (50 mg total) by mouth daily.    Dispense:  30 tablet    Refill:  0    Orders Placed  This Encounter  Procedures  . VITAMIN D 25 Hydroxy (Vit-D Deficiency, Fractures)  . Uric acid  . Lipid panel  . Hemoglobin A1c  . CBC with Differential/Platelet  . COMPLETE METABOLIC PANEL WITH GFR  . TSH  . Microalbumin / creatinine urine ratio  . POCT HgB A1C  . EKG 12-Lead  . ECHOCARDIOGRAM COMPLETE

## 2017-01-03 ENCOUNTER — Other Ambulatory Visit: Payer: Self-pay | Admitting: Family Medicine

## 2017-01-03 LAB — COMPLETE METABOLIC PANEL WITH GFR
AG RATIO: 1.7 (calc) (ref 1.0–2.5)
ALBUMIN MSPROF: 4.3 g/dL (ref 3.6–5.1)
ALT: 13 U/L (ref 6–29)
AST: 16 U/L (ref 10–35)
Alkaline phosphatase (APISO): 81 U/L (ref 33–130)
BILIRUBIN TOTAL: 0.5 mg/dL (ref 0.2–1.2)
BUN: 14 mg/dL (ref 7–25)
CHLORIDE: 104 mmol/L (ref 98–110)
CO2: 29 mmol/L (ref 20–32)
Calcium: 9.4 mg/dL (ref 8.6–10.4)
Creat: 0.7 mg/dL (ref 0.50–1.05)
GFR, EST AFRICAN AMERICAN: 111 mL/min/{1.73_m2} (ref >=60)
GFR, Est Non African American: 96 mL/min/{1.73_m2} (ref >=60)
Globulin: 2.6 g/dL (calc) (ref 1.9–3.7)
Glucose, Bld: 81 mg/dL (ref 65–99)
POTASSIUM: 4.3 mmol/L (ref 3.5–5.3)
Sodium: 141 mmol/L (ref 135–146)
TOTAL PROTEIN: 6.9 g/dL (ref 6.1–8.1)

## 2017-01-03 LAB — CBC WITH DIFFERENTIAL/PLATELET
BASOS PCT: 0.3 %
Basophils Absolute: 27 cells/uL (ref 0–200)
EOS ABS: 142 {cells}/uL (ref 15–500)
Eosinophils Relative: 1.6 %
HEMATOCRIT: 43.4 % (ref 35.0–45.0)
HEMOGLOBIN: 14.6 g/dL (ref 11.7–15.5)
LYMPHS ABS: 2643 {cells}/uL (ref 850–3900)
MCH: 29.2 pg (ref 27.0–33.0)
MCHC: 33.6 g/dL (ref 32.0–36.0)
MCV: 86.8 fL (ref 80.0–100.0)
MONOS PCT: 5.4 %
MPV: 10.2 fL (ref 7.5–12.5)
NEUTROS ABS: 5607 {cells}/uL (ref 1500–7800)
Neutrophils Relative %: 63 %
Platelets: 285 10*3/uL (ref 140–400)
RBC: 5 10*6/uL (ref 3.80–5.10)
RDW: 12.6 % (ref 11.0–15.0)
Total Lymphocyte: 29.7 %
WBC: 8.9 10*3/uL (ref 3.8–10.8)
WBCMIX: 481 {cells}/uL (ref 200–950)

## 2017-01-03 LAB — LIPID PANEL
CHOL/HDL RATIO: 3.9 (calc) (ref <5.0)
Cholesterol: 195 mg/dL (ref <200)
HDL: 50 mg/dL — AB (ref >50)
LDL Cholesterol (Calc): 129 mg/dL (calc) — ABNORMAL HIGH
NON-HDL CHOLESTEROL (CALC): 145 mg/dL — AB (ref <130)
TRIGLYCERIDES: 69 mg/dL (ref <150)

## 2017-01-03 LAB — MICROALBUMIN / CREATININE URINE RATIO
Creatinine, Urine: 116 mg/dL (ref 20–275)
MICROALB/CREAT RATIO: 6 ug/mg{creat} (ref <30)
Microalb, Ur: 0.7 mg/dL

## 2017-01-03 LAB — HEMOGLOBIN A1C
Hgb A1c MFr Bld: 5.7 % of total Hgb — ABNORMAL HIGH (ref <5.7)
Mean Plasma Glucose: 117 (calc)
eAG (mmol/L): 6.5 (calc)

## 2017-01-03 LAB — VITAMIN D 25 HYDROXY (VIT D DEFICIENCY, FRACTURES): Vit D, 25-Hydroxy: 14 ng/mL — ABNORMAL LOW (ref 30–100)

## 2017-01-03 LAB — TSH: TSH: 1.23 mIU/L (ref 0.40–4.50)

## 2017-01-03 LAB — URIC ACID: Uric Acid, Serum: 3.6 mg/dL (ref 2.5–7.0)

## 2017-01-03 MED ORDER — ATORVASTATIN CALCIUM 20 MG PO TABS: 20.0000 mg | ORAL_TABLET | Freq: Every day | ORAL | 1 refills | Status: DC

## 2017-01-03 MED ORDER — VITAMIN D (ERGOCALCIFEROL) 1.25 MG (50000 UNIT) PO CAPS: 50000.0000 [IU] | ORAL_CAPSULE | ORAL | 1 refills | Status: AC

## 2017-01-03 NOTE — Progress Notes (Signed)
Start vit D

## 2017-01-05 ENCOUNTER — Other Ambulatory Visit: Payer: Self-pay | Admitting: Family Medicine

## 2017-01-05 ENCOUNTER — Telehealth: Payer: Self-pay | Admitting: Family Medicine

## 2017-01-05 DIAGNOSIS — F339 Major depressive disorder, recurrent, unspecified: Secondary | ICD-10-CM | POA: Insufficient documentation

## 2017-01-05 DIAGNOSIS — F331 Major depressive disorder, recurrent, moderate: Secondary | ICD-10-CM

## 2017-01-05 DIAGNOSIS — F4320 Adjustment disorder, unspecified: Secondary | ICD-10-CM | POA: Insufficient documentation

## 2017-01-05 MED ORDER — QUETIAPINE FUMARATE 50 MG PO TABS: 50.0000 mg | ORAL_TABLET | Freq: Every day | ORAL | 0 refills | Status: DC

## 2017-01-05 NOTE — Telephone Encounter (Signed)
CALL CENTER CALLED AND PLACED PATIENT ON THE PHONE 3 WAY AND I LET THE PATIENT KNOW THAT A MESSAGE HAD BEEN SEND ON HER GETTING SOMETHING ELSE TO SLEEP. I TOLD HER THAT USUALLY THE DR WILL NOT PRESCRIBE AND CHANGE A MEDICATION UNLESS THEY SEE YOU . PATIENT HAS APPT ALREADY BUT OFFERED TO GIVE HER ONE SOONER IF SHE WOULD LIKE. ASKED HER TO WAIT IF SHE COULD TILL THE DR RESPONDS TO THE MESSAGE THAT WAS PLACED EARLIER. SHE SAID SHE WOULD WAIT TO HEAR FROM DR.

## 2017-01-05 NOTE — Telephone Encounter (Signed)
Pt called stating "that she has not been able to sleep"; pt recently seen by Dr Sanda Klein on 12/18 18 and prescribed trazadone; pt states that she has been taking the medication but she has not been able to sleep; pt further states that she took 2 pills last night, and still has not been to sleep; explained to pt that the prescription directions are to take 1 pill at bed time and to repeat each night as needed not take multiple pills; pt requests to have something else called in; contacted Suanne Marker at [facility] and initiated conference call; Suanne Marker explained to the pt that they had received her request from earlier today and that Dr Sanda Klein was with pts and has not seen her request; Suanne Marker further states that Dr Sanda Klein usually does not make medication changes without seeing the p, and offered the patient to schedule an appointment before her previously scheduled appointment for 01/18/17; pt states that she will wait for a call back from Dr Sanda Klein; see CRM [phone].

## 2017-01-05 NOTE — Progress Notes (Signed)
Trazodone removed from med list

## 2017-01-05 NOTE — Telephone Encounter (Signed)
She was up walking the floors and looking at the clock She took the 50 mg pill; she misunderstood the instructions and thought the "as needed" meant to repeat if needed She says she hasn't slept in over a year I asked for explanation; her sleep has been really sporadic She has not had any regular sleep for the last year I asked what she has taken in the past that has worked and she said "nothing" She tried tylenol PM but that doesn't work Discussed antipsychotic to help with depression and the sleep, but risk of heart attack She opts to try the new medicine; stop trazodone; don't take both New Rx to pharmacy with note to STOP trazodone on the Rx Patient was encouraged to call me back if needed

## 2017-01-05 NOTE — Telephone Encounter (Signed)
Copied from [place]. Topic: Quick Communication - See Telephone Encounter >> Jan 05, 2017 10:12 AM Aurelio Brash B wrote: CRM for notification. See Telephone encounter for:  Pt was prescribed trazondone  and she states it is not helping her sleep.  Pt  took two last night and still can not sleep.  Pt would like to be prescribed something different 01/05/17.

## 2017-01-05 NOTE — Assessment & Plan Note (Signed)
No hallucinations, no SI/HI; refer to psychiatrist

## 2017-01-05 NOTE — Assessment & Plan Note (Signed)
Refer to psych; add seroquel

## 2017-01-05 NOTE — Addendum Note (Signed)
Addended by: LADA, Satira Anis on: 01/05/2017 12:31 PM   Modules accepted: Orders

## 2017-01-18 ENCOUNTER — Ambulatory Visit: Payer: Self-pay | Admitting: Family Medicine

## 2017-01-23 ENCOUNTER — Encounter: Payer: Self-pay | Admitting: Family Medicine

## 2017-01-23 ENCOUNTER — Ambulatory Visit (INDEPENDENT_AMBULATORY_CARE_PROVIDER_SITE_OTHER): Payer: Medicare Other | Admitting: Family Medicine

## 2017-01-23 VITALS — BP 138/84 | HR 95 | Temp 98.4°F | Ht 61.0 in | Wt 144.0 lb

## 2017-01-23 DIAGNOSIS — E782 Mixed hyperlipidemia: Secondary | ICD-10-CM

## 2017-01-23 DIAGNOSIS — E1140 Type 2 diabetes mellitus with diabetic neuropathy, unspecified: Secondary | ICD-10-CM

## 2017-01-23 DIAGNOSIS — J069 Acute upper respiratory infection, unspecified: Secondary | ICD-10-CM | POA: Diagnosis not present

## 2017-01-23 DIAGNOSIS — F331 Major depressive disorder, recurrent, moderate: Secondary | ICD-10-CM | POA: Diagnosis not present

## 2017-01-23 DIAGNOSIS — E559 Vitamin D deficiency, unspecified: Secondary | ICD-10-CM

## 2017-01-23 MED ORDER — QUETIAPINE FUMARATE 50 MG PO TABS: 50.0000 mg | ORAL_TABLET | Freq: Every day | ORAL | 0 refills | Status: DC

## 2017-01-23 MED ORDER — SERTRALINE HCL 50 MG PO TABS: ORAL_TABLET | ORAL | 0 refills | Status: DC

## 2017-01-23 NOTE — Assessment & Plan Note (Signed)
Foot exam by MD; reviewed last labs

## 2017-01-23 NOTE — Assessment & Plan Note (Signed)
Back on statin. 

## 2017-01-23 NOTE — Progress Notes (Signed)
BP 138/84 (BP Location: Right Arm, Patient Position: Sitting, Cuff Size: Normal)   Pulse 95   Temp 98.4 F (36.9 C) (Oral)   Ht 5\' 1"  (1.549 m)   Wt 144 lb (65.3 kg)   SpO2 97%   BMI 27.21 kg/m    Subjective:    Patient ID: Deborah Blackwell, female    DOB: 1960-08-31, 57 y.o.   MRN: [id number]  HPI: Deborah Blackwell is a 57 y.o. female  Chief Complaint  Patient presents with  . Medication Management    Pt states medication is working, she feels much better, still very tearful but able to sleep about 4hrs a night now,  . URI    lots of sinus pressure and headache     HPI Patient is here for follow-up Her husband died and she was very depressed at last visit Medicine started, and she says she is feeling better now; sleeping 4-5 hours a night No side effects; wishing to continue; no SI/HI  Labs done last visit Vitamin D deficiency; got started on the prescription CBC, CMP normal; TSH normal  Type 2 diabetes Urine microalb:Cr was normal; A1c was 5.7  Having some congestion; blowing nose a lot; clear nasal discharge; some blood, irritated; no fevers; some cough; sneezing a lot; some headache; no ear problems; no flu symptoms like body aches; tried flonase; no nasal saline  Her cholesterol was elevated; started back on the statin; trying to avoid fatty meats  Depression screen [facility] 2/9 01/23/2017 01/02/2017 05/02/2016 10/18/2015 09/30/2015  Decreased Interest 3 3 0 0 0  Down, Depressed, Hopeless 1 3 0 0 1  PHQ - 2 Score 4 6 0 0 1  Altered sleeping 1 3 - - -  Tired, decreased energy 1 3 - - -  Change in appetite 0 3 - - -  Feeling bad or failure about yourself  0 1 - - -  Trouble concentrating 0 3 - - -  Moving slowly or fidgety/restless 0 0 - - -  Suicidal thoughts 0 0 - - -  PHQ-9 Score 6 19 - - -  Difficult doing work/chores - Very difficult - - -    Relevant past medical, surgical, family and social history reviewed Past Medical History:  Diagnosis Date  . Angina at rest  [facility])   . Arthritis   . Asthma   . Back pain with right-sided sciatica 09/30/2014  . Bipolar disorder ([place])   . Congenital scoliosis   . COPD (chronic obstructive pulmonary disease) ([place])   . DDD (degenerative disc disease), lumbar   . Depression   . Diabetes mellitus without complication ([place])   . GERD (gastroesophageal reflux disease)   . Gout   . History of kidney stones   . History of scoliosis    was casted as a child  . Hyperlipidemia   . Hypertension   . Lumbar radiculopathy 09/30/2014  . Migraines   . Panic attack   . RA (rheumatoid arthritis) ([place])    Past Surgical History:  Procedure Laterality Date  . ABDOMINAL HYSTERECTOMY  2003   one ovary remains: due to heavy bleeding/endometrioma/cysts  . BACK SURGERY     and injections  . CARPAL TUNNEL RELEASE    . FOOT SURGERY Right    bone spur  . POLYPECTOMY     uterine   Family History  Problem Relation Age of Onset  . Alzheimer's disease Mother   . Dementia Mother   . Cancer Mother  colon  . Heart disease Mother   . Hypertension Mother   . Depression Mother   . Alzheimer's disease Father   . Dementia Father   . Hypertension Father   . Cancer Brother        lung  . Lung disease Brother   . Hypertension Sister    Social History   Tobacco Use  . Smoking status: Former Smoker    Packs/day: 2.00    Years: 5.00    Pack years: 10.00    Types: Cigarettes    Last attempt to quit: 02/02/1991    Years since quitting: 25.9  . Smokeless tobacco: Never Used  Substance Use Topics  . Alcohol use: No    Alcohol/week: 0.0 oz  . Drug use: No    Interim medical history since last visit reviewed. Allergies and medications reviewed  Review of Systems Per HPI unless specifically indicated above     Objective:    BP 138/84 (BP Location: Right Arm, Patient Position: Sitting, Cuff Size: Normal)   Pulse 95   Temp 98.4 F (36.9 C) (Oral)   Ht 5\' 1"  (1.549 m)   Wt 144 lb (65.3 kg)   SpO2 97%   BMI 27.21  kg/m   Wt Readings from Last 3 Encounters:  01/23/17 144 lb (65.3 kg)  01/02/17 138 lb 14.4 oz (63 kg)  05/02/16 120 lb (54.4 kg)    Physical Exam  Constitutional: She appears well-developed and well-nourished. No distress.  HENT:  Right Ear: Tympanic membrane and ear canal normal.  Left Ear: Tympanic membrane and ear canal normal.  Nose: Mucosal edema, rhinorrhea and septal deviation present.  Mouth/Throat: Oropharynx is clear and moist.  Friable area with hyperemia but no active bleeding along septum of nose LEFT side  Eyes: EOM are normal. No scleral icterus.  Neck: No thyromegaly present.  Cardiovascular: Normal rate and regular rhythm.  Pulmonary/Chest: Effort normal and breath sounds normal. She has no decreased breath sounds. She has no wheezes. She has no rhonchi.  Abdominal: She exhibits no distension.  Lymphadenopathy:    She has no cervical adenopathy.  Skin: No pallor.  Psychiatric: Her behavior is normal. Judgment and thought content normal. Her mood appears anxious. Her affect is not blunt. She exhibits a depressed mood. She expresses no homicidal and no suicidal ideation.  Very mildly depressed; much improved affect; good eye contact with examiner; does not appear despondent   Diabetic Foot Form - Detailed   Diabetic Foot Exam - detailed Diabetic Foot exam was performed with the following findings:  Yes 01/23/2017 11:51 AM  Visual Foot Exam completed.:  Yes  Pulse Foot Exam completed.:  Yes  Right Dorsalis Pedis:  Present Left Dorsalis Pedis:  Present  Sensory Foot Exam Completed.:  Yes Semmes-Weinstein Monofilament Test R Site 1-Great Toe:  Pos L Site 1-Great Toe:  Pos    Comments:  Callus medial aspect both great toes      Results for orders placed or performed in visit on 01/02/17  VITAMIN D 25 Hydroxy (Vit-D Deficiency, Fractures)  Result Value Ref Range   Vit D, 25-Hydroxy 14 (L) 30 - 100 ng/mL  Uric acid  Result Value Ref Range   Uric Acid, Serum 3.6  2.5 - 7.0 mg/dL  Lipid panel  Result Value Ref Range   Cholesterol 195 <200 mg/dL   HDL 50 (L) >50 mg/dL   Triglycerides 69 <150 mg/dL   LDL Cholesterol (Calc) 129 (H) mg/dL (calc)   Total CHOL/HDL  Ratio 3.9 <5.0 (calc)   Non-HDL Cholesterol (Calc) 145 (H) <130 mg/dL (calc)  Hemoglobin A1c  Result Value Ref Range   Hgb A1c MFr Bld 5.7 (H) <5.7 % of total Hgb   Mean Plasma Glucose 117 (calc)   eAG (mmol/L) 6.5 (calc)  CBC with Differential/Platelet  Result Value Ref Range   WBC 8.9 3.8 - 10.8 Thousand/uL   RBC 5.00 3.80 - 5.10 Million/uL   Hemoglobin 14.6 11.7 - 15.5 g/dL   HCT 43.4 35.0 - 45.0 %   MCV 86.8 80.0 - 100.0 fL   MCH 29.2 27.0 - 33.0 pg   MCHC 33.6 32.0 - 36.0 g/dL   RDW 12.6 11.0 - 15.0 %   Platelets 285 140 - 400 Thousand/uL   MPV 10.2 7.5 - 12.5 fL   Neutro Abs 5,607 1,500 - 7,800 cells/uL   Lymphs Abs 2,643 850 - 3,900 cells/uL   WBC mixed population 481 200 - 950 cells/uL   Eosinophils Absolute 142 15 - 500 cells/uL   Basophils Absolute 27 0 - 200 cells/uL   Neutrophils Relative % 63 %   Total Lymphocyte 29.7 %   Monocytes Relative 5.4 %   Eosinophils Relative 1.6 %   Basophils Relative 0.3 %  COMPLETE METABOLIC PANEL WITH GFR  Result Value Ref Range   Glucose, Bld 81 65 - 99 mg/dL   BUN 14 7 - 25 mg/dL   Creat 0.70 0.50 - 1.05 mg/dL   GFR, Est Non African American 96 > OR = 60 mL/min/1.37m2   GFR, Est African American 111 > OR = 60 mL/min/1.39m2   BUN/Creatinine Ratio NOT APPLICABLE 6 - 22 (calc)   Sodium 141 135 - 146 mmol/L   Potassium 4.3 3.5 - 5.3 mmol/L   Chloride 104 98 - 110 mmol/L   CO2 29 20 - 32 mmol/L   Calcium 9.4 8.6 - 10.4 mg/dL   Total Protein 6.9 6.1 - 8.1 g/dL   Albumin 4.3 3.6 - 5.1 g/dL   Globulin 2.6 1.9 - 3.7 g/dL (calc)   AG Ratio 1.7 1.0 - 2.5 (calc)   Total Bilirubin 0.5 0.2 - 1.2 mg/dL   Alkaline phosphatase (APISO) 81 33 - 130 U/L   AST 16 10 - 35 U/L   ALT 13 6 - 29 U/L  TSH  Result Value Ref Range   TSH 1.23 0.40  - 4.50 mIU/L  Microalbumin / creatinine urine ratio  Result Value Ref Range   Creatinine, Urine 116 20 - 275 mg/dL   Microalb, Ur 0.7 mg/dL   Microalb Creat Ratio 6 <30 mcg/mg creat  POCT HgB A1C  Result Value Ref Range   Hemoglobin A1C 5.7       Assessment & Plan:   Problem List Items Addressed This Visit      Endocrine   Type 2 diabetes, controlled, with neuropathy (HCC) (Chronic)    Foot exam by MD; reviewed last labs        Other   Vitamin D deficiency    Taking vit D supplementation      Major depression, recurrent ([place])    Improved; continue medicine for now; will see her back in a few months for reassessment; continue with counseling      Relevant Medications   sertraline (ZOLOFT) 50 MG tablet   Hyperlipidemia (Chronic)    Back on statin       Other Visit Diagnoses    Upper respiratory infection, viral    -  Primary  explained dx; contagious; nasal saline rinse; no antibiotics needed       Follow up plan: Return in about 3 months (around 04/23/2017) for follow-up visit with Dr. Sanda Klein.  An after-visit summary was printed and given to the patient at [place].  Please see the patient instructions which may contain other information and recommendations beyond what is mentioned above in the assessment and plan.  Meds ordered this encounter  Medications  . QUEtiapine (SEROQUEL) 50 MG tablet    Sig: Take 1 tablet (50 mg total) by mouth at bedtime. STOP trazodone    Dispense:  30 tablet    Refill:  0  . sertraline (ZOLOFT) 50 MG tablet    Sig: One by mouth daily    Dispense:  30 tablet    Refill:  0    No orders of the defined types were placed in this encounter.

## 2017-01-23 NOTE — Assessment & Plan Note (Signed)
Taking vit D supplementation

## 2017-01-23 NOTE — Assessment & Plan Note (Signed)
Improved; continue medicine for now; will see her back in a few months for reassessment; continue with counseling

## 2017-01-23 NOTE — Patient Instructions (Addendum)
Try nasal saline rinse for your symptoms Let's continue your current medicines Try vitamin C (orange juice if not diabetic or vitamin C tablets) and drink green tea to help your immune system during your illness Get plenty of rest and hydration   Upper Respiratory Infection, Adult Most upper respiratory infections (URIs) are caused by a virus. A URI affects the nose, throat, and upper air passages. The most common type of URI is often called "the common cold." Follow these instructions at home:  Take medicines only as told by your doctor.  Gargle warm saltwater or take cough drops to comfort your throat as told by your doctor.  Use a warm mist humidifier or inhale steam from a shower to increase air moisture. This may make it easier to breathe.  Drink enough fluid to keep your pee (urine) clear or pale yellow.  Eat soups and other clear broths.  Have a healthy diet.  Rest as needed.  Go back to work when your fever is gone or your doctor says it is okay. ? You may need to stay home longer to avoid giving your URI to others. ? You can also wear a face mask and wash your hands often to prevent spread of the virus.  Use your inhaler more if you have asthma.  Do not use any tobacco products, including cigarettes, chewing tobacco, or electronic cigarettes. If you need help quitting, ask your doctor. Contact a doctor if:  You are getting worse, not better.  Your symptoms are not helped by medicine.  You have chills.  You are getting more short of breath.  You have brown or red mucus.  You have yellow or brown discharge from your nose.  You have pain in your face, especially when you bend forward.  You have a fever.  You have puffy (swollen) neck glands.  You have pain while swallowing.  You have white areas in the back of your throat. Get help right away if:  You have very bad or constant: ? Headache. ? Ear pain. ? Pain in your forehead, behind your eyes, and over  your cheekbones (sinus pain). ? Chest pain.  You have long-lasting (chronic) lung disease and any of the following: ? Wheezing. ? Long-lasting cough. ? Coughing up blood. ? A change in your usual mucus.  You have a stiff neck.  You have changes in your: ? Vision. ? Hearing. ? Thinking. ? Mood. This information is not intended to replace advice given to you by your health care provider. Make sure you discuss any questions you have with your health care provider. Document Released: 06/21/2007 Document Revised: 09/05/2015 Document Reviewed: 04/09/2013 Elsevier Interactive Patient Education  2018 [employer].

## 2017-01-26 ENCOUNTER — Encounter: Payer: Self-pay | Admitting: Family Medicine

## 2017-01-26 ENCOUNTER — Ambulatory Visit (INDEPENDENT_AMBULATORY_CARE_PROVIDER_SITE_OTHER): Payer: Medicare Other | Admitting: Family Medicine

## 2017-01-26 ENCOUNTER — Telehealth: Payer: Self-pay | Admitting: Family Medicine

## 2017-01-26 VITALS — BP 126/84 | HR 93 | Temp 98.2°F | Wt 144.0 lb

## 2017-01-26 DIAGNOSIS — R399 Unspecified symptoms and signs involving the genitourinary system: Secondary | ICD-10-CM

## 2017-01-26 DIAGNOSIS — N3001 Acute cystitis with hematuria: Secondary | ICD-10-CM

## 2017-01-26 LAB — POCT URINALYSIS DIPSTICK
Bilirubin, UA: NEGATIVE
Glucose, UA: NEGATIVE
KETONES UA: NEGATIVE
NITRITE UA: POSITIVE
PROTEIN UA: NEGATIVE
Spec Grav, UA: 1.015 (ref 1.010–1.025)
Urobilinogen, UA: 0.2 E.U./dL
pH, UA: 5 (ref 5.0–8.0)

## 2017-01-26 MED ORDER — CIPROFLOXACIN HCL 250 MG PO TABS: 250.0000 mg | ORAL_TABLET | Freq: Two times a day (BID) | ORAL | 0 refills | Status: AC

## 2017-01-26 MED ORDER — PHENAZOPYRIDINE HCL 200 MG PO TABS: 200.0000 mg | ORAL_TABLET | Freq: Three times a day (TID) | ORAL | 0 refills | Status: DC | PRN

## 2017-01-26 NOTE — Telephone Encounter (Signed)
Left detailed voicemail

## 2017-01-26 NOTE — Telephone Encounter (Signed)
No, I'm sorry; that will require either a MyChart visit or e-visit or acute visit We don't just call in antibiotics I hope she gets evaluated soon

## 2017-01-26 NOTE — Patient Instructions (Addendum)
Start the cipro Please do eat yogurt or kimchi or take a probiotic daily for the next month We want to replace the healthy germs in the gut If you notice foul, watery diarrhea in the next two months, schedule an appointment RIGHT AWAY or go to an urgent care or the emergency room if a holiday or over a weekend  Interstitial Cystitis Interstitial cystitis is a condition that causes inflammation of the bladder. The bladder is a hollow organ in the lower part of your abdomen. It stores urine after the urine is made by your kidneys. With interstitial cystitis, you may have pain in the bladder area. You may also have a frequent and urgent need to urinate. The severity of interstitial cystitis can vary from person to person. You may have flare-ups of the condition, and then it may go away for a while. For many people who have this condition, it becomes a long-term problem. What are the causes? The cause of this condition is not known. What increases the risk? This condition is more likely to develop in women. What are the signs or symptoms? Symptoms of interstitial cystitis vary, and they can change over time. Symptoms may include:  Discomfort or pain in the bladder area. This can range from mild to severe. The pain may change in intensity as the bladder fills with urine or as it empties.  Pelvic pain.  An urgent need to urinate.  Frequent urination.  Pain during sexual intercourse.  Pinpoint bleeding on the bladder [place].  For women, the symptoms often get worse during menstruation. How is this diagnosed? This condition is diagnosed by evaluating your symptoms and ruling out other causes. A physical exam will be done. Various tests may be done to rule out other conditions. Common tests include:  Urine tests.  Cystoscopy. In this test, a tool that is like a very thin telescope is used to look into your bladder.  Biopsy. This involves taking a sample of tissue from the bladder [place] to be  examined under a microscope.  How is this treated? There is no cure for interstitial cystitis, but treatment methods are available to control your symptoms. Work closely with your health care provider to find the treatments that will be most effective for you. Treatment options may include:  Medicines to relieve pain and to help reduce the number of times that you feel the need to urinate.  Bladder training. This involves learning ways to control when you urinate, such as: ? Urinating at scheduled times. ? Training yourself to delay urination. ? Doing exercises (Kegel exercises) to strengthen the muscles that control urine flow.  Lifestyle changes, such as changing your diet or taking steps to control stress.  Use of a device that provides electrical stimulation in order to reduce pain.  A procedure that stretches your bladder by filling it with air or fluid.  Surgery. This is rare. It is only done for extreme cases if other treatments do not help.  Follow these instructions at home:  Take medicines only as directed by your health care provider.  Use bladder training techniques as directed. ? Keep a bladder diary to find out which foods, liquids, or activities make your symptoms worse. ? Use your bladder diary to schedule bathroom trips. If you are away from home, plan to be near a bathroom at each of your scheduled times. ? Make sure you urinate just before you leave the house and just before you go to bed.  Do Kegel  exercises as directed by your health care provider.  Do not drink alcohol.  Do not use any tobacco products, including cigarettes, chewing tobacco, or electronic cigarettes. If you need help quitting, ask your health care provider.  Make dietary changes as directed by your health care provider. You may need to avoid spicy foods and foods that contain a high amount of potassium.  Limit your drinking of beverages that stimulate urination. These include soda, coffee,  and tea.  Keep all follow-up visits as directed by your health care provider. This is important. Contact a health care provider if:  Your symptoms do not get better after treatment.  Your pain and discomfort are getting worse.  You have more frequent urges to urinate.  You have a fever. Get help right away if:  You are not able to control your bladder at all. This information is not intended to replace advice given to you by your health care provider. Make sure you discuss any questions you have with your health care provider. Document Released: 09/03/2003 Document Revised: 06/10/2015 Document Reviewed: 09/09/2013 Elsevier Interactive Patient Education  [employer].

## 2017-01-26 NOTE — Telephone Encounter (Signed)
Copied from [place] [id number]. Topic: Quick Communication - See Telephone Encounter >> Jan 26, 2017  8:03 AM Ether Griffins B wrote: CRM for notification. See Telephone encounter for:  Pt is having pain in her pelvic area and she keeps feeling like  she needs to urinate and when she goes its only a little bit.  She is wanting to know if Dr. Sanda Klein would call something in for her. Requesting cipro  01/26/17.

## 2017-01-26 NOTE — Progress Notes (Signed)
BP 126/84 (BP Location: Right Arm, Patient Position: Sitting, Cuff Size: Normal)   Pulse 93   Temp 98.2 F (36.8 C) (Oral)   Wt 144 lb (65.3 kg)   SpO2 98%   BMI 27.21 kg/m    Subjective:    Patient ID: Deborah Blackwell, female    DOB: 27-Jun-1960, 57 y.o.   MRN: [id number]  HPI: Deborah Blackwell is a 57 y.o. female  Chief Complaint  Patient presents with  . Urinary Tract Infection  . Dysuria    HPI Patient is here for an acute visit Sx started yesterday; urinary frequency; just going a little bit at a time Little bit of blood No fevers No chills Left sided back pain Pretty significant pressure over the bladder Has had these in the past No long car trips or holding urine Has had kidney stones, but has passed them on their own Treated with cipro in the past No remedies tried in the last two days  Depression screen [facility] 2/9 01/26/2017 01/23/2017 01/02/2017 05/02/2016 10/18/2015  Decreased Interest 1 3 3  0 0  Down, Depressed, Hopeless 1 1 3  0 0  PHQ - 2 Score 2 4 6  0 0  Altered sleeping 2 1 3  - -  Tired, decreased energy 2 1 3  - -  Change in appetite 1 0 3 - -  Feeling bad or failure about yourself  0 0 1 - -  Trouble concentrating 0 0 3 - -  Moving slowly or fidgety/restless 0 0 0 - -  Suicidal thoughts 0 0 0 - -  PHQ-9 Score 7 6 19  - -  Difficult doing work/chores - - Very difficult - -    Relevant past medical, surgical, family and social history reviewed Past Medical History:  Diagnosis Date  . Angina at rest [facility])   . Arthritis   . Asthma   . Back pain with right-sided sciatica 09/30/2014  . Bipolar disorder ([place])   . Congenital scoliosis   . COPD (chronic obstructive pulmonary disease) ([place])   . DDD (degenerative disc disease), lumbar   . Depression   . Diabetes mellitus without complication ([place])   . GERD (gastroesophageal reflux disease)   . Gout   . History of kidney stones   . History of scoliosis    was casted as a child  . Hyperlipidemia   .  Hypertension   . Lumbar radiculopathy 09/30/2014  . Migraines   . Panic attack   . RA (rheumatoid arthritis) ([place])    Past Surgical History:  Procedure Laterality Date  . ABDOMINAL HYSTERECTOMY  2003   one ovary remains: due to heavy bleeding/endometrioma/cysts  . BACK SURGERY     and injections  . CARPAL TUNNEL RELEASE    . FOOT SURGERY Right    bone spur  . POLYPECTOMY     uterine   Family History  Problem Relation Age of Onset  . Alzheimer's disease Mother   . Dementia Mother   . Cancer Mother        colon  . Heart disease Mother   . Hypertension Mother   . Depression Mother   . Alzheimer's disease Father   . Dementia Father   . Hypertension Father   . Cancer Brother        lung  . Lung disease Brother   . Hypertension Sister    Social History   Tobacco Use  . Smoking status: Former Smoker    Packs/day: 2.00  Years: 5.00    Pack years: 10.00    Types: Cigarettes    Last attempt to quit: 02/02/1991    Years since quitting: 26.0  . Smokeless tobacco: Never Used  Substance Use Topics  . Alcohol use: No    Alcohol/week: 0.0 oz  . Drug use: No    Interim medical history since last visit reviewed. Allergies and medications reviewed  Review of Systems Per HPI unless specifically indicated above     Objective:    BP 126/84 (BP Location: Right Arm, Patient Position: Sitting, Cuff Size: Normal)   Pulse 93   Temp 98.2 F (36.8 C) (Oral)   Wt 144 lb (65.3 kg)   SpO2 98%   BMI 27.21 kg/m   Wt Readings from Last 3 Encounters:  01/26/17 144 lb (65.3 kg)  01/23/17 144 lb (65.3 kg)  01/02/17 138 lb 14.4 oz (63 kg)    Physical Exam  Constitutional: She appears well-developed and well-nourished. No distress.  HENT:  Mouth/Throat: Mucous membranes are normal.  Eyes: No scleral icterus.  Cardiovascular: Normal rate and regular rhythm.  Pulmonary/Chest: Effort normal and breath sounds normal.  Abdominal: There is tenderness in the suprapubic area. There  is no CVA tenderness.  Skin: She is not diaphoretic. No pallor.  Psychiatric: Her behavior is normal.  Mildly anxious; not depressed; good eye contact with examiner    Results for orders placed or performed in visit on 01/26/17  POCT urinalysis dipstick  Result Value Ref Range   Color, UA pale yellow    Clarity, UA cloudy    Glucose, UA neg    Bilirubin, UA neg    Ketones, UA neg    Spec Grav, UA 1.015 1.010 - 1.025   Blood, UA LARGE    pH, UA 5.0 5.0 - 8.0   Protein, UA neg    Urobilinogen, UA 0.2 0.2 or 1.0 E.U./dL   Nitrite, UA POSITIVE    Leukocytes, UA Moderate (2+) (A) Negative   Appearance cloudy    Odor none       Assessment & Plan:   Problem List Items Addressed This Visit    None    Visit Diagnoses    Acute cystitis with hematuria    -  Primary   start antibiotic, pyridium for symptoms; hydration; reasons to go to urgent care over weekend reviewed; culture pending; recheck urine in 2-3 weeks   Relevant Orders   Urinalysis w microscopic + reflex cultur   UTI symptoms       see abnormal urine dip; reviewed with patient   Relevant Medications   phenazopyridine (PYRIDIUM) 200 MG tablet   Other Relevant Orders   POCT urinalysis dipstick (Completed)   Urine Culture       Follow up plan: Return in about 3 weeks (around 02/16/2017) for urine check only with the CMA.  An after-visit summary was printed and given to the patient at [place].  Please see the patient instructions which may contain other information and recommendations beyond what is mentioned above in the assessment and plan.  Meds ordered this encounter  Medications  . ciprofloxacin (CIPRO) 250 MG tablet    Sig: Take 1 tablet (250 mg total) by mouth 2 (two) times daily for 5 days.    Dispense:  10 tablet    Refill:  0  . phenazopyridine (PYRIDIUM) 200 MG tablet    Sig: Take 1 tablet (200 mg total) by mouth 3 (three) times daily as needed for pain.  Dispense:  6 tablet    Refill:  0     Orders Placed This Encounter  Procedures  . Urine Culture  . Urinalysis w microscopic + reflex cultur  . POCT urinalysis dipstick

## 2017-01-29 ENCOUNTER — Telehealth: Payer: Self-pay

## 2017-01-29 LAB — URINE CULTURE
MICRO NUMBER: 90047800
SPECIMEN QUALITY:: ADEQUATE

## 2017-01-29 NOTE — Telephone Encounter (Signed)
Called pt she states that she is feeling much better, advised pt to continue antibiotic and keep appt in 2wks for follow up urine.

## 2017-01-29 NOTE — Telephone Encounter (Signed)
-----   Message from Arnetha Courser, MD sent at 01/29/2017 11:41 AM EST ----- Please let pt know that the cipro should be working; we hope she's feeling better; thank you

## 2017-02-03 ENCOUNTER — Other Ambulatory Visit: Payer: Self-pay | Admitting: Family Medicine

## 2017-02-03 NOTE — Telephone Encounter (Signed)
Reviewed last tsh, Cr, and K+ Rxs approved Sertraline was a little early, but approved

## 2017-02-16 ENCOUNTER — Other Ambulatory Visit: Payer: Self-pay | Admitting: Family Medicine

## 2017-02-16 ENCOUNTER — Other Ambulatory Visit: Payer: Self-pay

## 2017-02-16 DIAGNOSIS — Z87440 Personal history of urinary (tract) infections: Secondary | ICD-10-CM

## 2017-02-16 NOTE — Progress Notes (Signed)
ur

## 2017-02-17 LAB — URINE CULTURE
MICRO NUMBER:: 90140898
RESULT: NO GROWTH
SPECIMEN QUALITY:: ADEQUATE

## 2017-03-05 ENCOUNTER — Other Ambulatory Visit: Payer: Self-pay | Admitting: Family Medicine

## 2017-03-05 NOTE — Telephone Encounter (Signed)
Last SGPT reviewed; appt future already scheduled rxs approved

## 2017-04-17 DIAGNOSIS — Z79899 Other long term (current) drug therapy: Secondary | ICD-10-CM | POA: Diagnosis not present

## 2017-04-25 ENCOUNTER — Ambulatory Visit: Payer: Self-pay | Admitting: Family Medicine

## 2017-05-21 IMAGING — CR DG CERVICAL SPINE COMPLETE 4+V
5 series · 5 of 5 positions shown · non-contrast
Comparison: No recent prior.

CLINICAL DATA: Chronic pain. No known injury. Initial evaluation .

EXAM:
CERVICAL SPINE - COMPLETE 4+ VIEW

[c-spine lat]
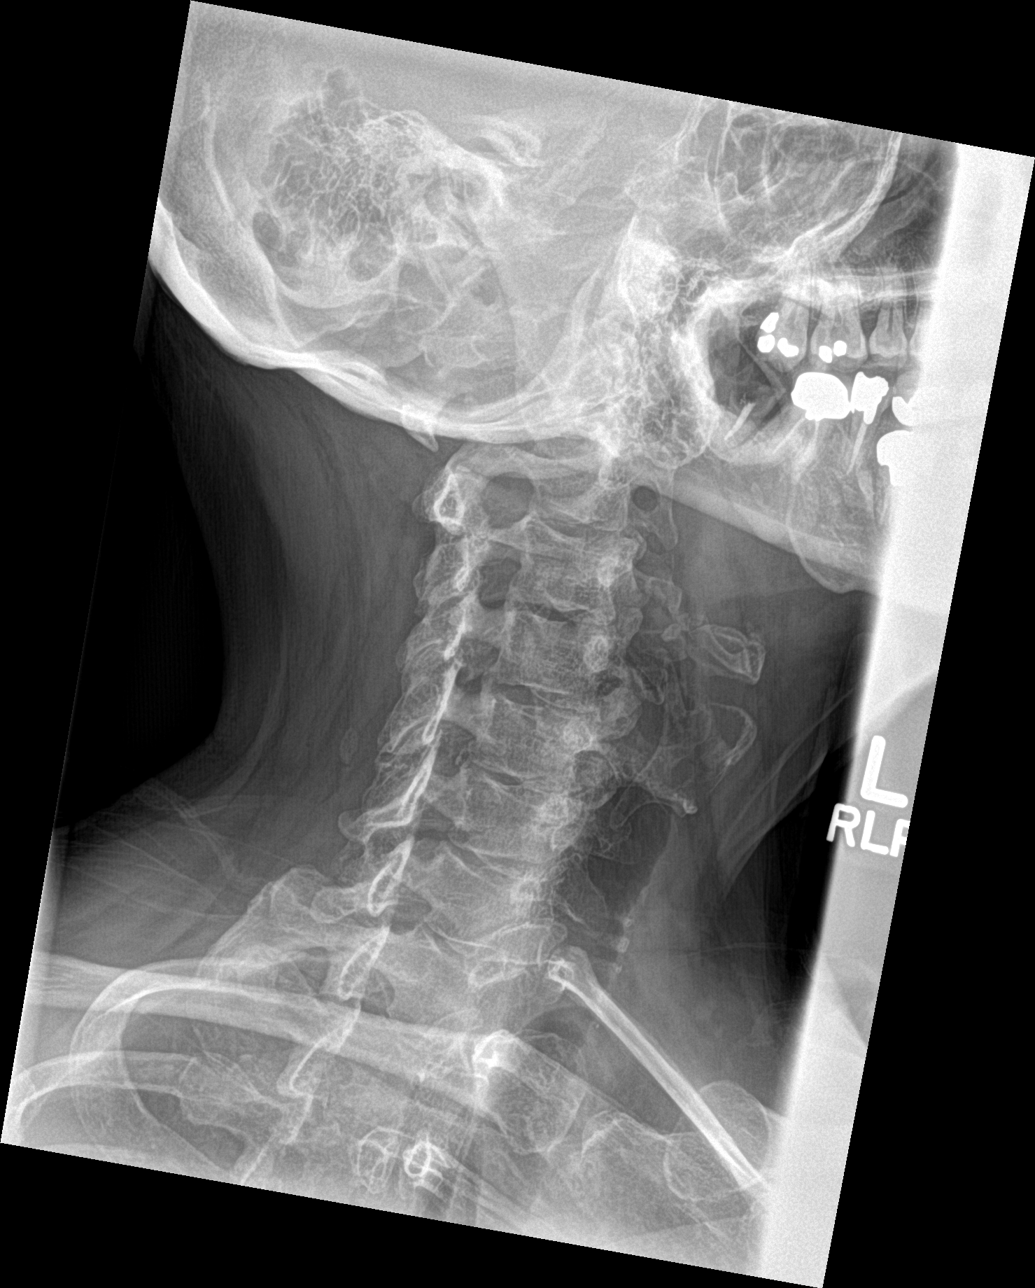

[c-spine obl (1 of 2)]
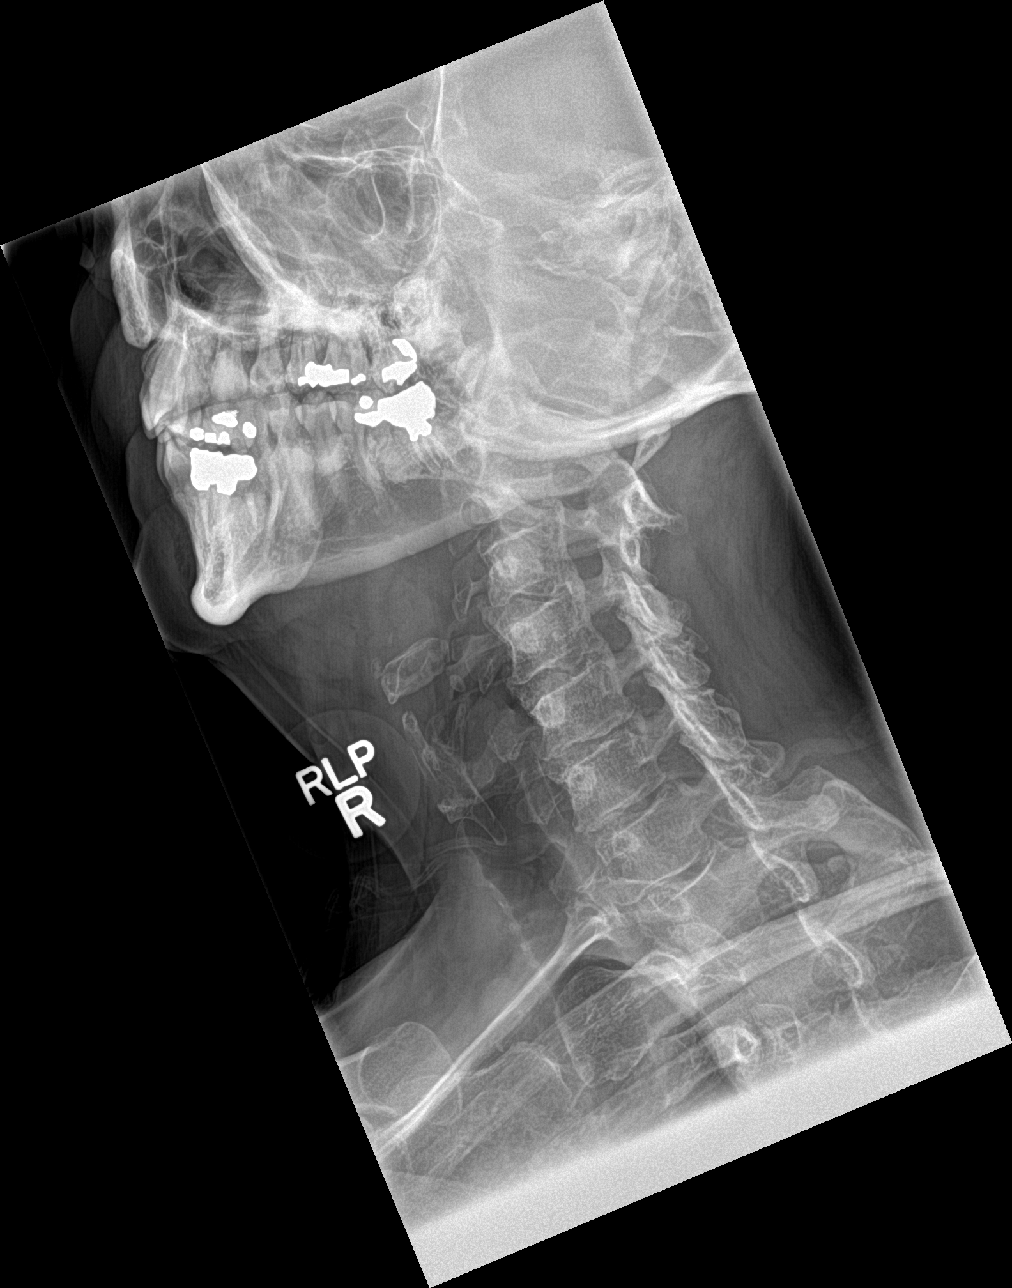

[c-spine obl (2 of 2)]
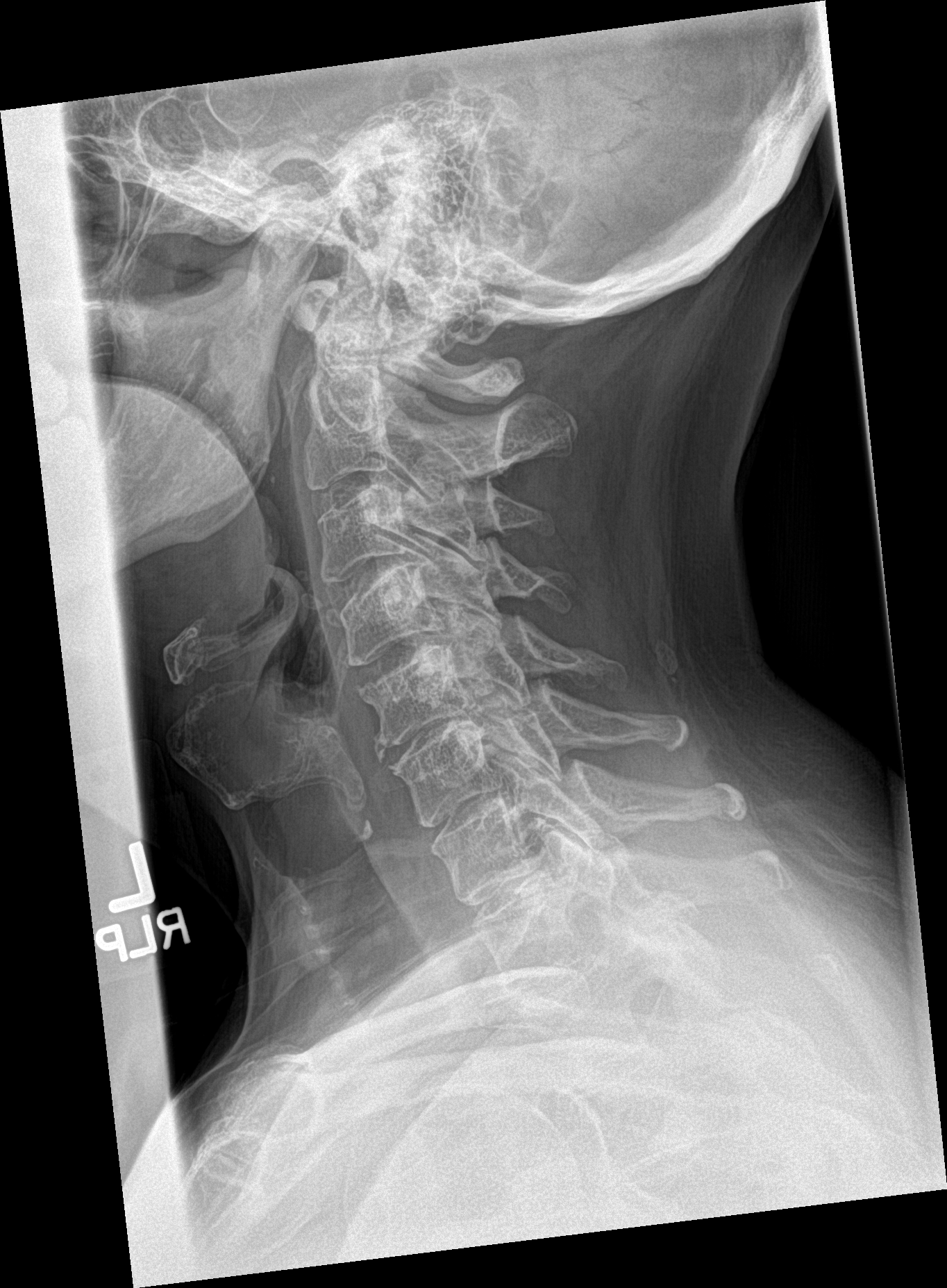

[c-spine ap]
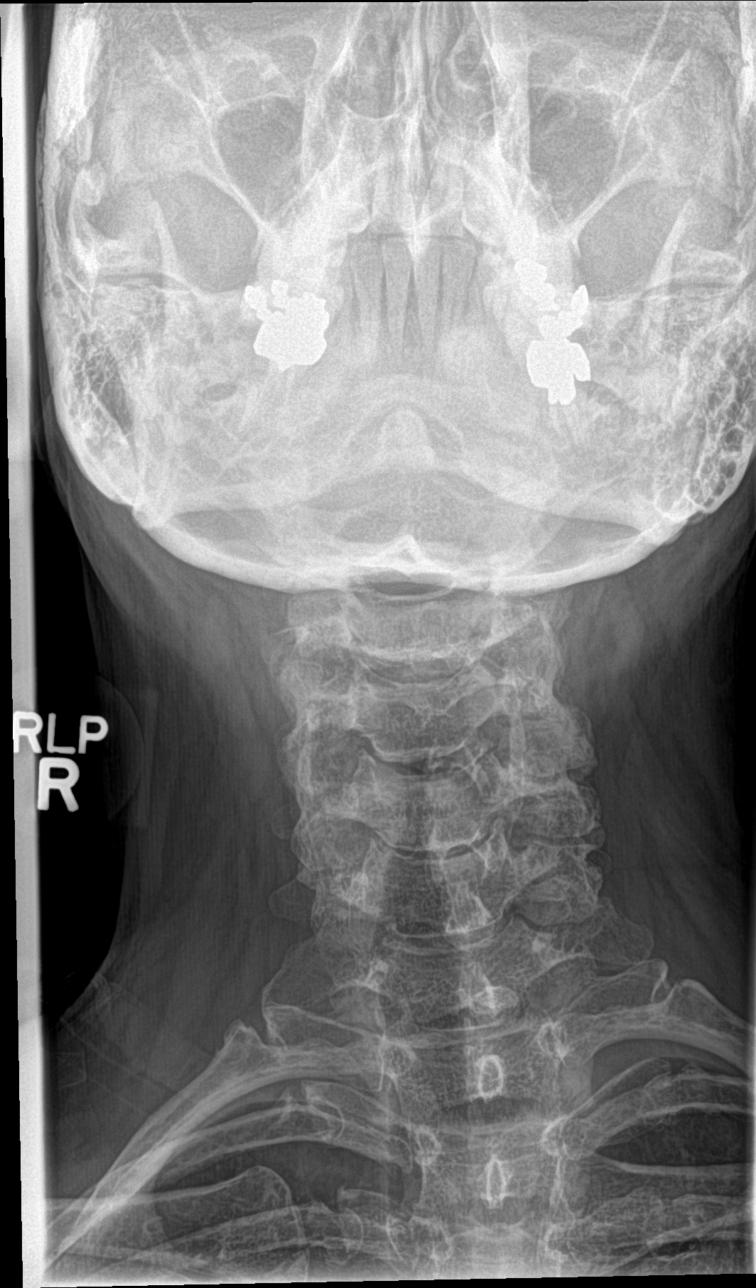

[c-spine open mouth]
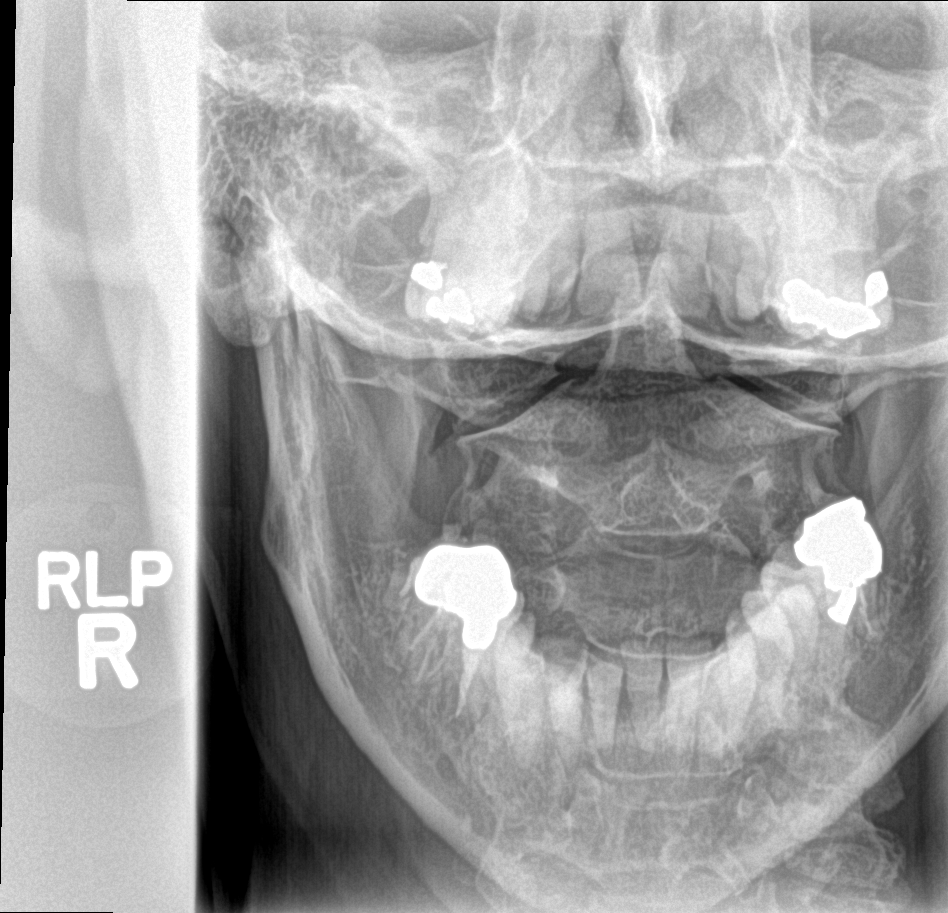

[5 of 5 positions shown; findings below may reference images not displayed]

FINDINGS: Diffuse multilevel degenerative change. No acute abnormality
identified. Ligamentous calcification. Diffuse osteopenia.
IMPRESSION: Diffuse degenerative change.  No acute abnormality identified.

## 2017-05-21 IMAGING — CR DG SHOULDER 2+V*R*
3 series · 3 of 3 positions shown · non-contrast
Comparison: No recent prior.

CLINICAL DATA: Right shoulder pain.  No known injury.

EXAM:
RIGHT SHOULDER - 2+ VIEW

[shoulder grashey]
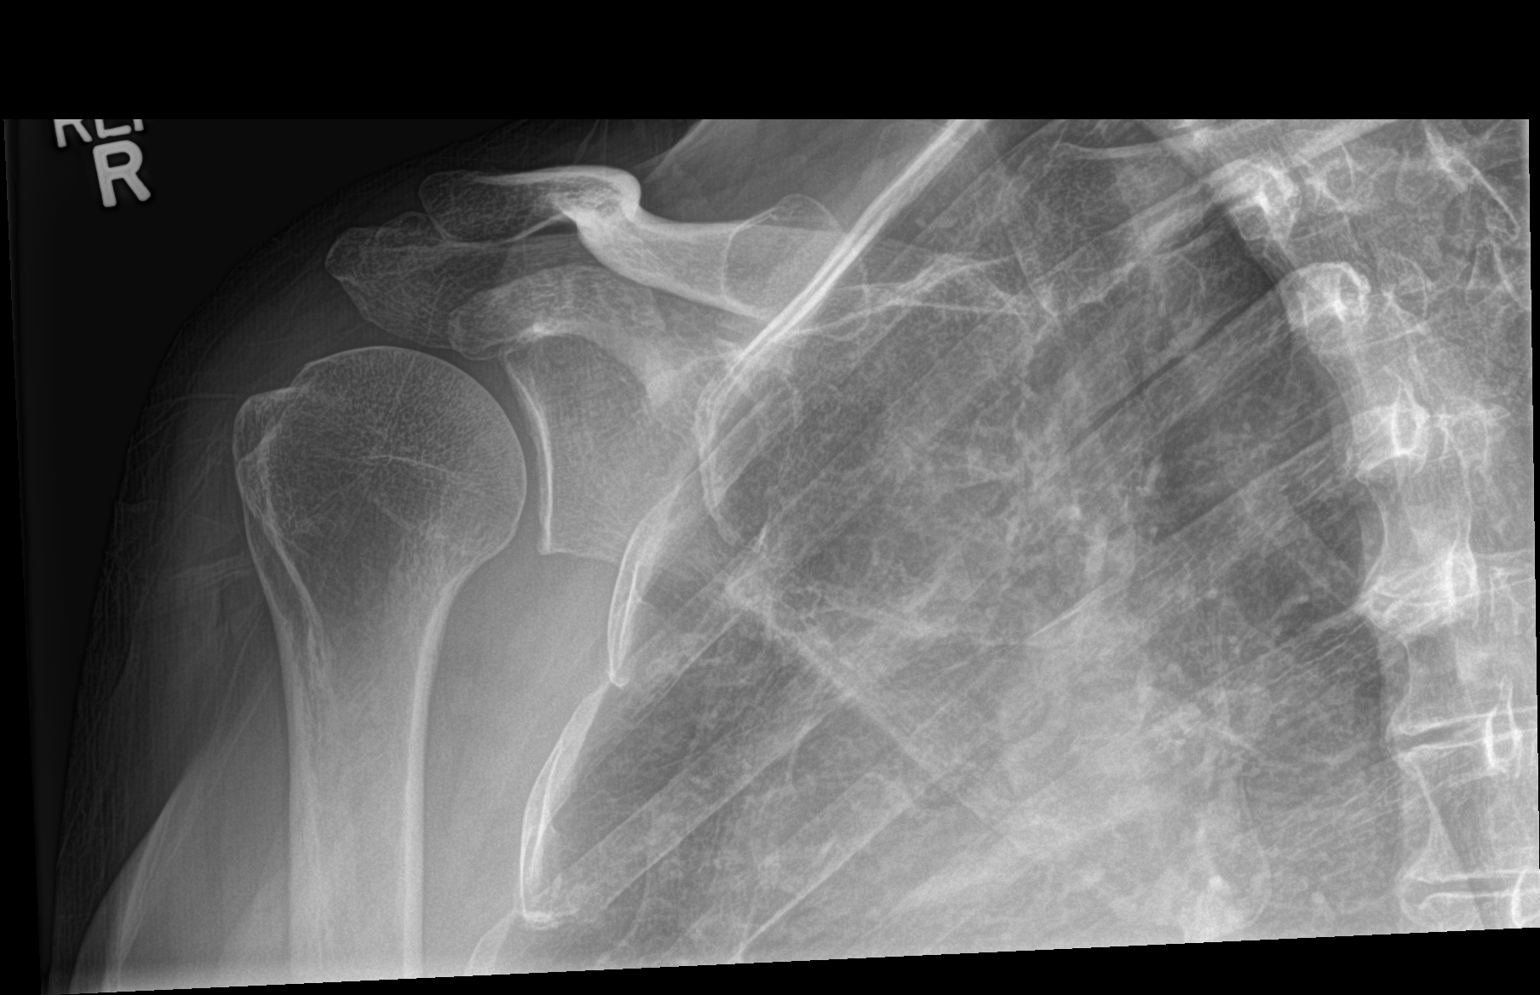

[shoulder y view]
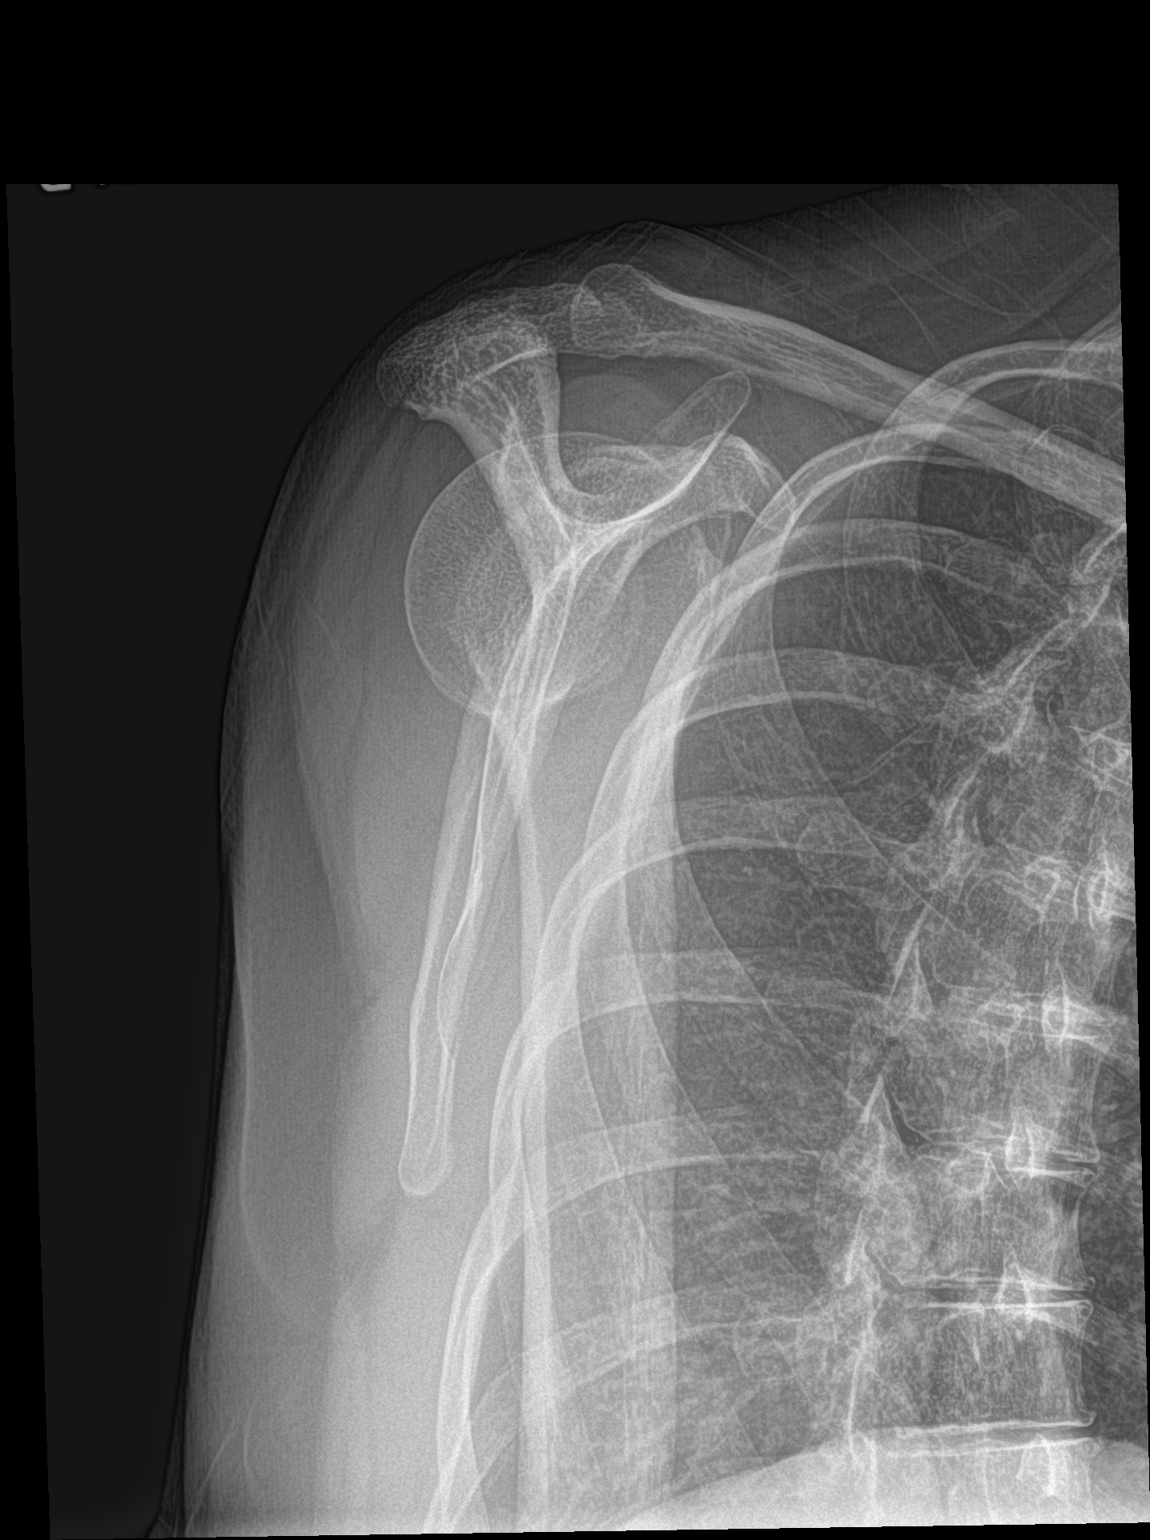

[shoulder axillary]
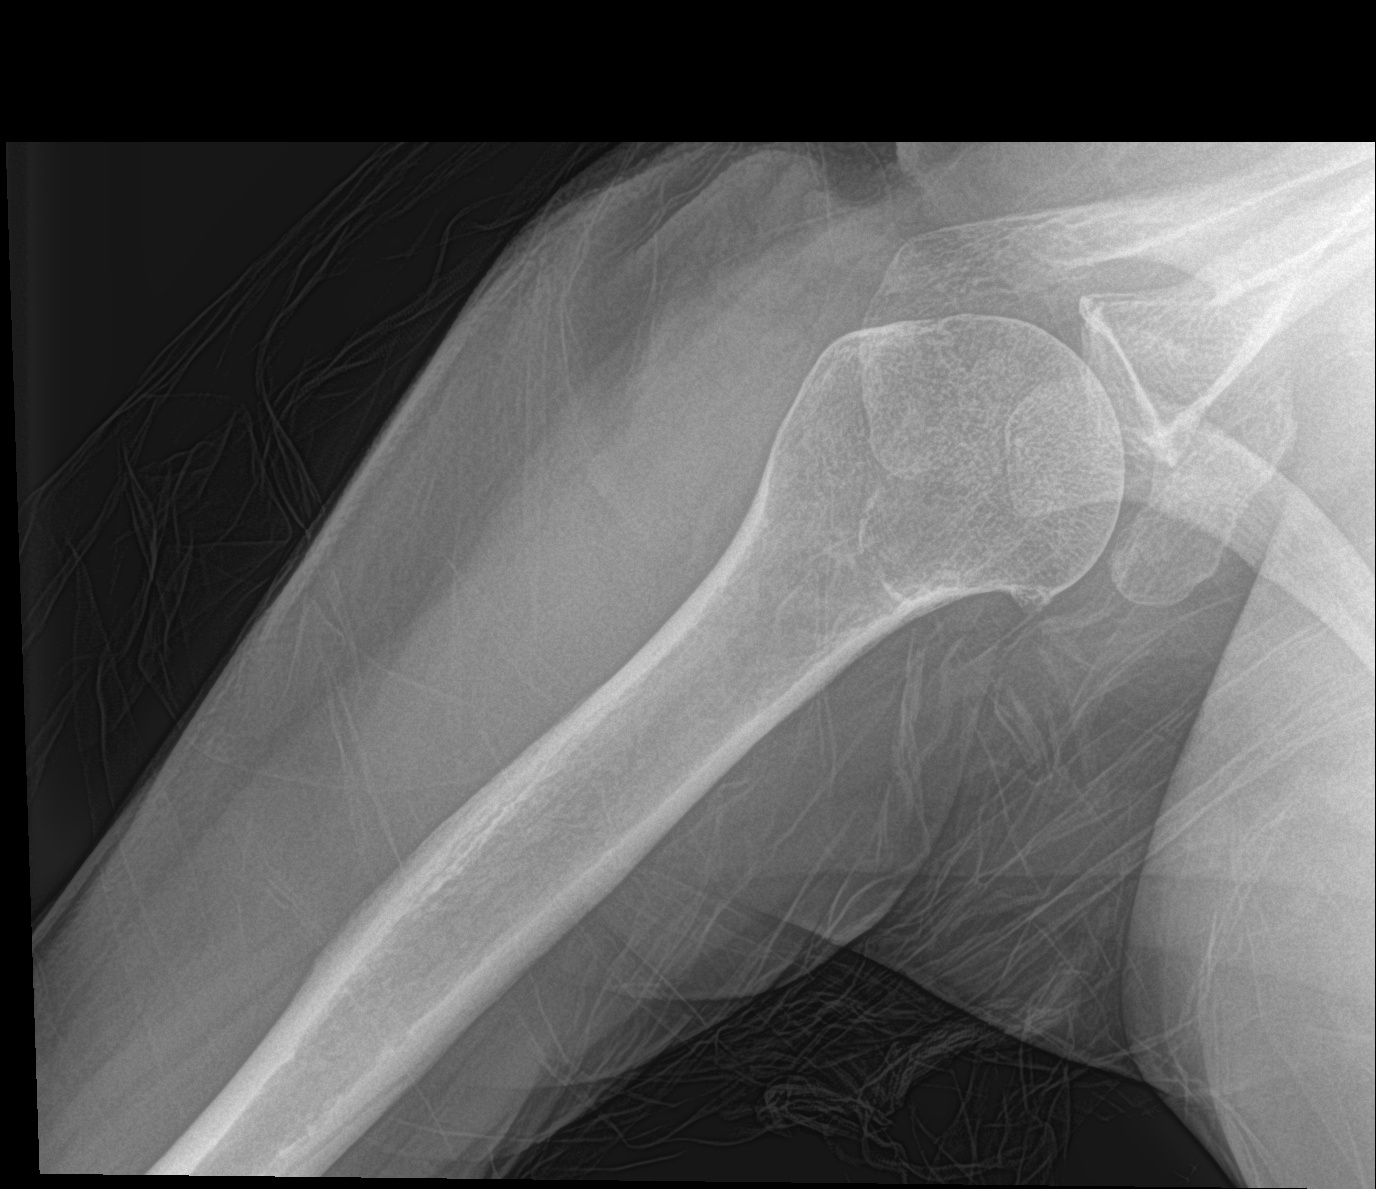

[3 of 3 positions shown; findings below may reference images not displayed]

FINDINGS: No acute bony or joint abnormality identified. No evidence of
fracture or dislocation.
IMPRESSION: No acute or focal abnormality.

## 2017-05-29 ENCOUNTER — Telehealth: Payer: Self-pay | Admitting: Family Medicine

## 2017-05-29 MED ORDER — VALSARTAN 80 MG PO TABS: 80.0000 mg | ORAL_TABLET | Freq: Every day | ORAL | 0 refills | Status: DC

## 2017-05-29 NOTE — Telephone Encounter (Signed)
Pt called back and message given. Offered to make appt for BP check and blood work. Pt stated that she needs to arrange transportation 1st and then will call back to schedule appt for BP check and blood work.

## 2017-05-29 NOTE — Telephone Encounter (Signed)
This encounter was created in error - please disregard.

## 2017-05-29 NOTE — Telephone Encounter (Signed)
Please let the patient know that her losartan has been recalled Stop taking that Start new BP med Come in for CMA BP check and BMP in 10-14 days

## 2017-05-29 NOTE — Telephone Encounter (Signed)
Left detailed voicemail

## 2017-07-04 ENCOUNTER — Other Ambulatory Visit: Payer: Self-pay

## 2017-07-04 NOTE — Patient Outreach (Signed)
[facility]) Care Management  07/04/2017  AMIT MELOY Nov 06, 1960 [id number]   Medication Adherence call to Mrs. Indiah Diclemente left a message for patient to call back patient is due on Atorvatatin 20 mg and Valsartan 80 mg. [facility] pharmacy said patient has not pick up for June. Mrs, Vesey is showing past due under [place].   [place] Management Direct Dial [phone]  Fax [phone] [email or web address]

## 2017-07-17 ENCOUNTER — Other Ambulatory Visit: Payer: Self-pay

## 2017-07-17 NOTE — Patient Outreach (Signed)
[facility]) Care Management  07/17/2017  Deborah Blackwell [birth year] [id number]   Medication Adherence call to Deborah Blackwell patient  did not answer left a message for patient to call back patient is due on Atorvastatin 20 mg and Valsartan 80 mg.Deborah Blackwell is showing past due under [place].  [place] Management Direct Dial [phone]  Fax [phone] [email or web address]

## 2017-09-11 ENCOUNTER — Other Ambulatory Visit: Payer: Self-pay | Admitting: Family Medicine

## 2017-09-14 ENCOUNTER — Other Ambulatory Visit: Payer: Self-pay | Admitting: Family Medicine

## 2017-09-19 ENCOUNTER — Other Ambulatory Visit: Payer: Self-pay | Admitting: Family Medicine

## 2017-09-20 ENCOUNTER — Other Ambulatory Visit: Payer: Self-pay | Admitting: Family Medicine

## 2017-09-24 ENCOUNTER — Other Ambulatory Visit: Payer: Self-pay | Admitting: Family Medicine

## 2017-09-24 NOTE — Telephone Encounter (Signed)
Copied from [place] [id number]. Topic: Quick Communication - Rx Refill/Question >> Sep 24, 2017 10:10 AM Blase Mess A wrote: Medication: valsartan (DIOVAN) 80 MG tablet [[id number]]   Has the patient contacted their pharmacy? Yes  (Agent: If no, request that the patient contact the pharmacy for the refill.) (Agent: If yes, when and what did the pharmacy advise?)  Preferred Pharmacy (with phone number or street name): [place], [place] - [address] [place] [address] Phone: [phone] Fax: [phone]    Agent: Please be advised that RX refills may take up to 3 business days. We ask that you follow-up with your pharmacy.

## 2017-09-25 NOTE — Telephone Encounter (Signed)
Patient checking status, out of meds

## 2017-09-26 MED ORDER — VALSARTAN 80 MG PO TABS: 80.0000 mg | ORAL_TABLET | Freq: Every day | ORAL | 0 refills | Status: DC

## 2017-09-26 NOTE — Telephone Encounter (Signed)
She needs a visit and labs please I sent the Rx

## 2017-09-26 NOTE — Telephone Encounter (Signed)
Rx sent 

## 2017-10-01 ENCOUNTER — Ambulatory Visit (INDEPENDENT_AMBULATORY_CARE_PROVIDER_SITE_OTHER): Payer: Medicare Other | Admitting: Family Medicine

## 2017-10-01 ENCOUNTER — Encounter: Payer: Self-pay | Admitting: Family Medicine

## 2017-10-01 VITALS — BP 126/72 | HR 96 | Temp 98.3°F | Resp 14 | Ht 61.0 in | Wt 168.5 lb

## 2017-10-01 DIAGNOSIS — E782 Mixed hyperlipidemia: Secondary | ICD-10-CM | POA: Diagnosis not present

## 2017-10-01 DIAGNOSIS — J449 Chronic obstructive pulmonary disease, unspecified: Secondary | ICD-10-CM

## 2017-10-01 DIAGNOSIS — E039 Hypothyroidism, unspecified: Secondary | ICD-10-CM | POA: Diagnosis not present

## 2017-10-01 DIAGNOSIS — E559 Vitamin D deficiency, unspecified: Secondary | ICD-10-CM | POA: Diagnosis not present

## 2017-10-01 DIAGNOSIS — R519 Headache, unspecified: Secondary | ICD-10-CM

## 2017-10-01 DIAGNOSIS — R51 Headache: Secondary | ICD-10-CM

## 2017-10-01 DIAGNOSIS — Z23 Encounter for immunization: Secondary | ICD-10-CM | POA: Diagnosis not present

## 2017-10-01 DIAGNOSIS — E1140 Type 2 diabetes mellitus with diabetic neuropathy, unspecified: Secondary | ICD-10-CM

## 2017-10-01 DIAGNOSIS — R5383 Other fatigue: Secondary | ICD-10-CM | POA: Diagnosis not present

## 2017-10-01 DIAGNOSIS — Z1239 Encounter for other screening for malignant neoplasm of breast: Secondary | ICD-10-CM

## 2017-10-01 DIAGNOSIS — F331 Major depressive disorder, recurrent, moderate: Secondary | ICD-10-CM

## 2017-10-01 DIAGNOSIS — Z1231 Encounter for screening mammogram for malignant neoplasm of breast: Secondary | ICD-10-CM

## 2017-10-01 DIAGNOSIS — I10 Essential (primary) hypertension: Secondary | ICD-10-CM

## 2017-10-01 DIAGNOSIS — M109 Gout, unspecified: Secondary | ICD-10-CM

## 2017-10-01 DIAGNOSIS — Z1211 Encounter for screening for malignant neoplasm of colon: Secondary | ICD-10-CM

## 2017-10-01 MED ORDER — ATORVASTATIN CALCIUM 20 MG PO TABS: 20.0000 mg | ORAL_TABLET | Freq: Every day | ORAL | 2 refills | Status: DC

## 2017-10-01 NOTE — Assessment & Plan Note (Signed)
Not using rescue inhaler more than twice a week; quit smoking

## 2017-10-01 NOTE — Assessment & Plan Note (Signed)
Check lipids today; continue statin  And consider zetia; avoid saturated fats

## 2017-10-01 NOTE — Assessment & Plan Note (Signed)
Will check TSH and adjus tmedicine if needed, especially with weight gain

## 2017-10-01 NOTE — Addendum Note (Signed)
Addended by: LADA, Satira Anis on: 10/01/2017 12:01 PM   Modules accepted: Orders

## 2017-10-01 NOTE — Assessment & Plan Note (Signed)
Check labs today; urine was done in Dec and normal; no need to check FSBS right now; eye exam report requested

## 2017-10-01 NOTE — Assessment & Plan Note (Signed)
Check level and replace if needed 

## 2017-10-01 NOTE — Assessment & Plan Note (Addendum)
Will not check uric acid today; avoid triggers; no recent flares

## 2017-10-01 NOTE — Progress Notes (Addendum)
BP 126/72   Pulse 96   Temp 98.3 F (36.8 C) (Oral)   Resp 14   Ht 5\' 1"  (1.549 m)   Wt 168 lb 8 oz (76.4 kg)   SpO2 97%   BMI 31.84 kg/m    Subjective:    Patient ID: Deborah Blackwell, female    DOB: May 16, 1960, 57 y.o.   MRN: [id number]  HPI: Deborah Blackwell is a 57 y.o. female  Chief Complaint  Patient presents with  . Follow-up  . Headache    HPI Patient is here for f/u  Type 2 diabetes; checking FSBS daily; around 120; good for 8 months; no lows; no problems with feet Eye exam UTD and she signed papers for Dr. Gloriann Loan; no problems with retina per patient; requesting records today  Lab Results  Component Value Date   HGBA1C 5.7 (H) 01/02/2017  urine microalbumin:Cr normal Dec 2018  High cholesterol; last LDL 129; not many fatty meats; not much cheese even though she likes it; not many eggs; taking atorvastatin, no problems with that medicines  Hypertension; checks BP at home and gets readings around normal usually; but 3 weeks ago, it was 168/135; she wondered what was going on; did not feel good that day; later on it went down like it should have; maybe some stress; not adding much salt to the food; some headaches lately; across the temples and back of the head; started having them 3-4 x a week, then they dropped off to 2-3 x a week; she takes ibuprofen and sometimes lasts a few hours, sometimes longer; no vision changes; hx of migraines, but these don't feel like migraines; no N/V; she has woken up with headaches; no numbness or weakness of arm or leg; no trouble speaking or swallowing; she does clench teeth at night; seeing psychiatrist for stress; she has gained some weight; hx of vitamin D deficiency, it was 14 nine months ago; wasn't eating right when her husband was sick  Using rescue inhaler, just PRN; no more than two times a week; heat is a trigger; has AC at home  Gout; no recent flares; avoiding triggers  Seeing psychiatrist; grieving still; on medicine;  brother  Depression screen [facility] 2/9 10/01/2017 10/01/2017 10/01/2017 01/26/2017 01/23/2017  Decreased Interest - 0 0 1 3  Down, Depressed, Hopeless - 1 1 1 1   PHQ - 2 Score - 1 1 2 4   Altered sleeping - 0 - 2 1  Tired, decreased energy - 2 - 2 1  Change in appetite - 2 - 1 0  Feeling bad or failure about yourself  - 2 - 0 0  Trouble concentrating - 2 - 0 0  Moving slowly or fidgety/restless - 0 - 0 0  Suicidal thoughts 0 0 - 0 0  PHQ-9 Score - 9 - 7 6  Difficult doing work/chores - Somewhat difficult - - -  Some recent data might be hidden  MD note: suicidal thoughts are a zero  Relevant past medical, surgical, family and social history reviewed Past Medical History:  Diagnosis Date  . Angina at rest [facility])   . Arthritis   . Asthma   . Back pain with right-sided sciatica 09/30/2014  . Bipolar disorder ([place])   . Congenital scoliosis   . COPD (chronic obstructive pulmonary disease) ([place])   . DDD (degenerative disc disease), lumbar   . Depression   . Diabetes mellitus without complication ([place])   . GERD (gastroesophageal reflux disease)   .  Gout   . History of kidney stones   . History of scoliosis    was casted as a child  . Hyperlipidemia   . Hypertension   . Lumbar radiculopathy 09/30/2014  . Migraines   . Panic attack   . RA (rheumatoid arthritis) ([place])    Past Surgical History:  Procedure Laterality Date  . ABDOMINAL HYSTERECTOMY  2003   one ovary remains: due to heavy bleeding/endometrioma/cysts  . BACK SURGERY     and injections  . CARPAL TUNNEL RELEASE    . FOOT SURGERY Right    bone spur  . POLYPECTOMY     uterine   Family History  Problem Relation Age of Onset  . Alzheimer's disease Mother   . Dementia Mother   . Cancer Mother        colon  . Heart disease Mother   . Hypertension Mother   . Depression Mother   . Alzheimer's disease Father   . Dementia Father   . Hypertension Father   . Cancer Brother        lung  . Lung disease Brother   .  Hypertension Sister    Social History   Tobacco Use  . Smoking status: Former Smoker    Packs/day: 2.00    Years: 5.00    Pack years: 10.00    Types: Cigarettes    Last attempt to quit: 02/02/1991    Years since quitting: 26.6  . Smokeless tobacco: Never Used  Substance Use Topics  . Alcohol use: No    Alcohol/week: 0.0 standard drinks  . Drug use: No    Interim medical history since last visit reviewed. Allergies and medications reviewed  Review of Systems Per HPI unless specifically indicated above     Objective:    BP 126/72   Pulse 96   Temp 98.3 F (36.8 C) (Oral)   Resp 14   Ht 5\' 1"  (1.549 m)   Wt 168 lb 8 oz (76.4 kg)   SpO2 97%   BMI 31.84 kg/m   Wt Readings from Last 3 Encounters:  10/01/17 168 lb 8 oz (76.4 kg)  01/26/17 144 lb (65.3 kg)  01/23/17 144 lb (65.3 kg)    Physical Exam  Constitutional: She appears well-developed and well-nourished. No distress.  Weight gain 24 pounds over 8 months per our scales (more per pt)  HENT:  Head: Normocephalic and atraumatic.  Eyes: EOM are normal. No scleral icterus.  Neck: No thyromegaly present.  Cardiovascular: Normal rate, regular rhythm and normal heart sounds.  No murmur heard. Pulmonary/Chest: Effort normal and breath sounds normal. No respiratory distress. She has no wheezes.  Abdominal: Soft. Bowel sounds are normal. She exhibits no distension.  Musculoskeletal: She exhibits no edema.  Neurological: She is alert.  Skin: Skin is warm and dry. She is not diaphoretic. No pallor.  Psychiatric: She has a normal mood and affect. Her behavior is normal. Judgment and thought content normal.    Results for orders placed or performed in visit on 02/16/17  Urine Culture  Result Value Ref Range   MICRO NUMBER: [id number]    SPECIMEN QUALITY: ADEQUATE    Sample Source URINE    STATUS: FINAL    Result: No Growth       Assessment & Plan:   Problem List Items Addressed This Visit      Cardiovascular and  Mediastinum   Hypertension (Chronic)    Controlled, continue ARB; check Cr; try DASH guidelines  Respiratory   COPD (chronic obstructive pulmonary disease) (HCC) (Chronic)    Not using rescue inhaler more than twice a week; quit smoking        Endocrine   Type 2 diabetes, controlled, with neuropathy ([place]) - Primary (Chronic)    Check labs today; urine was done in Dec and normal; no need to check FSBS right now; eye exam report requested      Relevant Orders   Hemoglobin A1c   Lipid panel   Hypothyroidism (Chronic)    Will check TSH and adjus tmedicine if needed, especially with weight gain      Relevant Orders   TSH     Other   Vitamin D deficiency    Check level and replace if needed      Relevant Orders   VITAMIN D 25 Hydroxy (Vit-D Deficiency, Fractures)   Major depression, recurrent (HCC)    Check vit B12 and vit D and TSH; seeing psychiatrist and going to grief share      Relevant Medications   ALPRAZolam (XANAX) 1 MG tablet   Hyperlipidemia (Chronic)    Check lipids today; continue statin  And consider zetia; avoid saturated fats      Gout (Chronic)    Will not check uric acid today; avoid triggers; no recent flares       Other Visit Diagnoses    Screen for colon cancer       Relevant Orders   Ambulatory referral to Gastroenterology   Screening for breast cancer       Relevant Orders   MM 3D SCREEN BREAST BILATERAL   Need for influenza vaccination       Relevant Orders   Flu Vaccine QUAD 6+ mos PF IM (Fluarix Quad PF) (Completed)   Other fatigue       Relevant Orders   CBC with Differential/Platelet   COMPLETE METABOLIC PANEL WITH GFR   Vitamin B12   Nonintractable headache, unspecified chronicity pattern, unspecified headache type       consider night guard for mouth, avoid clenching teeth, talk to psychiatrist; consider roll on therapy; if persistent, then consider ABX for poss sinus infection       Follow up plan: Return in about 3  months (around 12/31/2017).  An after-visit summary was printed and given to the patient at [place].  Please see the patient instructions which may contain other information and recommendations beyond what is mentioned above in the assessment and plan.  No orders of the defined types were placed in this encounter.   Orders Placed This Encounter  Procedures  . MM 3D SCREEN BREAST BILATERAL  . Flu Vaccine QUAD 6+ mos PF IM (Fluarix Quad PF)  . CBC with Differential/Platelet  . COMPLETE METABOLIC PANEL WITH GFR  . Hemoglobin A1c  . Lipid panel  . TSH  . VITAMIN D 25 Hydroxy (Vit-D Deficiency, Fractures)  . Vitamin B12  . Ambulatory referral to Gastroenterology    Insurance would not pay for a B12 level so I had to cancer this

## 2017-10-01 NOTE — Patient Instructions (Signed)
Try to limit saturated fats in your diet (bologna, hot dogs, barbeque, cheeseburgers, hamburgers, steak, bacon, sausage, cheese, etc.) and get more fresh fruits, vegetables, and whole grains  Try to follow the DASH guidelines (DASH stands for Dietary Approaches to Stop Hypertension). Try to limit the sodium in your diet to no more than 1,500mg  of sodium per day. Certainly try to not exceed 2,000 mg per day at the very most. Do not add salt when cooking or at the table.  Check the sodium amount on labels when shopping, and choose items lower in sodium when given a choice. Avoid or limit foods that already contain a lot of sodium. Eat a diet rich in fruits and vegetables and whole grains, and try to lose weight if overweight or obese  Check out the information at familydoctor.org entitled "Nutrition for Weight Loss: What You Need to Know about Fad Diets" Try to lose between 1-2 pounds per week by taking in fewer calories and burning off more calories You can succeed by limiting portions, limiting foods dense in calories and fat, becoming more active, and drinking 8 glasses of water a day (64 ounces) Don't skip meals, especially breakfast, as skipping meals may alter your metabolism Do not use over-the-counter weight loss pills or gimmicks that claim rapid weight loss A healthy BMI (or body mass index) is between 18.5 and 24.9 You can calculate your ideal BMI at the [place] website ClubMonetize.fr

## 2017-10-01 NOTE — Assessment & Plan Note (Signed)
Check vit B12 and vit D and TSH; seeing psychiatrist and going to grief share

## 2017-10-01 NOTE — Assessment & Plan Note (Signed)
Controlled, continue ARB; check Cr; try DASH guidelines

## 2017-10-02 ENCOUNTER — Encounter: Payer: Self-pay | Admitting: Family Medicine

## 2017-10-02 LAB — CBC WITH DIFFERENTIAL/PLATELET
BASOS ABS: 40 {cells}/uL (ref 0–200)
Basophils Relative: 0.6 %
EOS ABS: 264 {cells}/uL (ref 15–500)
EOS PCT: 4 %
HEMATOCRIT: 38.4 % (ref 35.0–45.0)
HEMOGLOBIN: 12.5 g/dL (ref 11.7–15.5)
LYMPHS ABS: 1439 {cells}/uL (ref 850–3900)
MCH: 28.7 pg (ref 27.0–33.0)
MCHC: 32.6 g/dL (ref 32.0–36.0)
MCV: 88.3 fL (ref 80.0–100.0)
MPV: 9.5 fL (ref 7.5–12.5)
Monocytes Relative: 7.6 %
NEUTROS ABS: 4356 {cells}/uL (ref 1500–7800)
NEUTROS PCT: 66 %
Platelets: 285 10*3/uL (ref 140–400)
RBC: 4.35 10*6/uL (ref 3.80–5.10)
RDW: 12.4 % (ref 11.0–15.0)
Total Lymphocyte: 21.8 %
WBC: 6.6 10*3/uL (ref 3.8–10.8)
WBCMIX: 502 {cells}/uL (ref 200–950)

## 2017-10-02 LAB — COMPLETE METABOLIC PANEL WITH GFR
AG RATIO: 1.8 (calc) (ref 1.0–2.5)
ALKALINE PHOSPHATASE (APISO): 69 U/L (ref 33–130)
ALT: 13 U/L (ref 6–29)
AST: 16 U/L (ref 10–35)
Albumin: 4.2 g/dL (ref 3.6–5.1)
BILIRUBIN TOTAL: 0.4 mg/dL (ref 0.2–1.2)
BUN: 11 mg/dL (ref 7–25)
CHLORIDE: 106 mmol/L (ref 98–110)
CO2: 31 mmol/L (ref 20–32)
Calcium: 9.1 mg/dL (ref 8.6–10.4)
Creat: 0.63 mg/dL (ref 0.50–1.05)
GFR, Est African American: 114 mL/min/{1.73_m2} (ref >=60)
GFR, Est Non African American: 98 mL/min/{1.73_m2} (ref >=60)
Globulin: 2.4 g/dL (calc) (ref 1.9–3.7)
Glucose, Bld: 89 mg/dL (ref 65–99)
POTASSIUM: 4.8 mmol/L (ref 3.5–5.3)
Sodium: 143 mmol/L (ref 135–146)
TOTAL PROTEIN: 6.6 g/dL (ref 6.1–8.1)

## 2017-10-02 LAB — LIPID PANEL
CHOL/HDL RATIO: 3.8 (calc) (ref <5.0)
Cholesterol: 200 mg/dL — ABNORMAL HIGH (ref <200)
HDL: 53 mg/dL (ref >50)
LDL Cholesterol (Calc): 130 mg/dL (calc) — ABNORMAL HIGH
Non-HDL Cholesterol (Calc): 147 mg/dL (calc) — ABNORMAL HIGH (ref <130)
Triglycerides: 78 mg/dL (ref <150)

## 2017-10-02 LAB — HEMOGLOBIN A1C
EAG (MMOL/L): 6.8 (calc)
HEMOGLOBIN A1C: 5.9 %{Hb} — AB (ref <5.7)
Mean Plasma Glucose: 123 (calc)

## 2017-10-02 LAB — TSH: TSH: 0.62 m[IU]/L (ref 0.40–4.50)

## 2017-10-02 LAB — VITAMIN D 25 HYDROXY (VIT D DEFICIENCY, FRACTURES): VIT D 25 HYDROXY: 27 ng/mL — AB (ref 30–100)

## 2017-10-08 ENCOUNTER — Other Ambulatory Visit: Payer: Self-pay | Admitting: Family Medicine

## 2017-10-08 ENCOUNTER — Telehealth: Payer: Self-pay

## 2017-10-08 DIAGNOSIS — E782 Mixed hyperlipidemia: Secondary | ICD-10-CM

## 2017-10-08 MED ORDER — GUAIFENESIN ER 600 MG PO TB12: 600.0000 mg | ORAL_TABLET | Freq: Two times a day (BID) | ORAL | 0 refills | Status: DC | PRN

## 2017-10-08 MED ORDER — ATORVASTATIN CALCIUM 40 MG PO TABS: 40.0000 mg | ORAL_TABLET | Freq: Every day | ORAL | 1 refills | Status: DC

## 2017-10-08 MED ORDER — LEVOTHYROXINE SODIUM 50 MCG PO TABS: ORAL_TABLET | ORAL | 2 refills | Status: DC

## 2017-10-08 MED ORDER — FLUTICASONE PROPIONATE 50 MCG/ACT NA SUSP: 2.0000 | Freq: Every day | NASAL | 6 refills | Status: DC

## 2017-10-08 NOTE — Telephone Encounter (Signed)
I thought one was still pending I apologize; the B12 was canceled b/c code not covered Labs responded to in lab results tab

## 2017-10-08 NOTE — Telephone Encounter (Signed)
We don't recommend z-paks to treat sinus infections any more There is greater than 25% resistance to that antibiotic for sinusitis I have sent in two prescriptions for her to her pharmacy; a nasal spray and mucinex to help loosen adherent drainage along sinus passages She may also try neti pot or saline mist to help loosen debris as well Appointment or urgent care if getting worse, fever, etc.

## 2017-10-08 NOTE — Progress Notes (Signed)
Increase statin; recheck lipids in 6 weeks   

## 2017-10-08 NOTE — Telephone Encounter (Signed)
Pt notified she states has been using nasal spray with no releif

## 2017-10-08 NOTE — Telephone Encounter (Signed)
Please review labs. 

## 2017-10-08 NOTE — Telephone Encounter (Signed)
Copied from [place] [id number]. Topic: General - Other >> Oct 08, 2017  8:20 AM Carolyn Stare wrote:    Also following up on her labs  Pt said she has a sinus infection and talk to the doctor about it last Monday at her appt. She call today to ask for a [place]

## 2017-10-09 ENCOUNTER — Telehealth: Payer: Self-pay

## 2017-10-09 DIAGNOSIS — E782 Mixed hyperlipidemia: Secondary | ICD-10-CM

## 2017-10-09 NOTE — Telephone Encounter (Signed)
-----   Message from Arnetha Courser, MD sent at 10/08/2017  6:11 PM EDT ----- Please let the patient know that her glucose is normal but A1c is in the "prediabetes" range; work on weight loss and healthy eating; her LDL is higher than we'd like, so let's increase her statin from 20 mg to 40 mg; new Rx sent and I've ordered lipid panel to be done in 6 weeks Thyroid test is okay, so I sent refills of thyroid med Vitamin D is low, so take an extra 2,000 iu of vit D3 OTC daily for 3-4 months, then 1,000 iu daily

## 2017-10-24 ENCOUNTER — Encounter: Payer: Self-pay | Admitting: *Deleted

## 2017-10-26 ENCOUNTER — Other Ambulatory Visit: Payer: Self-pay | Admitting: Family Medicine

## 2017-11-16 ENCOUNTER — Other Ambulatory Visit: Payer: Self-pay | Admitting: Family Medicine

## 2017-11-16 MED ORDER — VALSARTAN 80 MG PO TABS: 80.0000 mg | ORAL_TABLET | Freq: Every day | ORAL | 1 refills | Status: DC

## 2017-12-31 ENCOUNTER — Ambulatory Visit: Payer: Self-pay | Admitting: Family Medicine

## 2018-01-14 ENCOUNTER — Ambulatory Visit (INDEPENDENT_AMBULATORY_CARE_PROVIDER_SITE_OTHER): Payer: Medicare Other | Admitting: Family Medicine

## 2018-01-14 ENCOUNTER — Encounter: Payer: Self-pay | Admitting: Family Medicine

## 2018-01-14 VITALS — BP 110/72 | HR 87 | Temp 98.4°F | Ht 61.0 in | Wt 164.0 lb

## 2018-01-14 DIAGNOSIS — E1140 Type 2 diabetes mellitus with diabetic neuropathy, unspecified: Secondary | ICD-10-CM | POA: Diagnosis not present

## 2018-01-14 DIAGNOSIS — E039 Hypothyroidism, unspecified: Secondary | ICD-10-CM

## 2018-01-14 DIAGNOSIS — J0100 Acute maxillary sinusitis, unspecified: Secondary | ICD-10-CM

## 2018-01-14 DIAGNOSIS — E782 Mixed hyperlipidemia: Secondary | ICD-10-CM

## 2018-01-14 DIAGNOSIS — B354 Tinea corporis: Secondary | ICD-10-CM

## 2018-01-14 DIAGNOSIS — J449 Chronic obstructive pulmonary disease, unspecified: Secondary | ICD-10-CM | POA: Diagnosis not present

## 2018-01-14 DIAGNOSIS — F4320 Adjustment disorder, unspecified: Secondary | ICD-10-CM

## 2018-01-14 LAB — LIPID PANEL
Cholesterol: 231 mg/dL — ABNORMAL HIGH (ref <200)
HDL: 46 mg/dL — ABNORMAL LOW (ref >50)
LDL Cholesterol (Calc): 169 mg/dL (calc) — ABNORMAL HIGH
NON-HDL CHOLESTEROL (CALC): 185 mg/dL — AB (ref <130)
Total CHOL/HDL Ratio: 5 (calc) — ABNORMAL HIGH (ref <5.0)
Triglycerides: 61 mg/dL (ref <150)

## 2018-01-14 MED ORDER — LEVOTHYROXINE SODIUM 50 MCG PO TABS: ORAL_TABLET | ORAL | 2 refills | Status: DC

## 2018-01-14 MED ORDER — AMOXICILLIN-POT CLAVULANATE 875-125 MG PO TABS: 1.0000 | ORAL_TABLET | Freq: Two times a day (BID) | ORAL | 0 refills | Status: DC

## 2018-01-14 MED ORDER — VALSARTAN 80 MG PO TABS: 80.0000 mg | ORAL_TABLET | Freq: Every day | ORAL | 1 refills | Status: DC

## 2018-01-14 MED ORDER — FLUTICASONE PROPIONATE HFA 110 MCG/ACT IN AERO: 1.0000 | INHALATION_SPRAY | Freq: Two times a day (BID) | RESPIRATORY_TRACT | 12 refills | Status: DC

## 2018-01-14 MED ORDER — FLUCONAZOLE 150 MG PO TABS: 150.0000 mg | ORAL_TABLET | Freq: Every day | ORAL | 0 refills | Status: DC

## 2018-01-14 MED ORDER — ECONAZOLE NITRATE 1 % EX CREA: TOPICAL_CREAM | Freq: Every day | CUTANEOUS | 0 refills | Status: DC

## 2018-01-14 MED ORDER — ATORVASTATIN CALCIUM 40 MG PO TABS: 40.0000 mg | ORAL_TABLET | Freq: Every day | ORAL | 1 refills | Status: DC

## 2018-01-14 NOTE — Assessment & Plan Note (Signed)
Foot exam by MD today; eye exam UTD; limiting sugary drinks

## 2018-01-14 NOTE — Assessment & Plan Note (Signed)
Supportive listening, encouragement; she is seeing someone for this

## 2018-01-14 NOTE — Assessment & Plan Note (Signed)
Stable; continue current dose

## 2018-01-14 NOTE — Patient Instructions (Signed)
Try to limit saturated fats in your diet (bologna, hot dogs, barbeque, cheeseburgers, hamburgers, steak, bacon, sausage, cheese, etc.) and get more fresh fruits, vegetables, and whole grains  Keep up the great job with weight loss efforts Check out the information at familydoctor.org entitled "Nutrition for Weight Loss: What You Need to Know about Fad Diets" Try to lose between 1 pound per week by taking in fewer calories and burning off more calories You can succeed by limiting portions, limiting foods dense in calories and fat, becoming more active, and drinking 8 glasses of water a day (64 ounces) Don't skip meals, especially breakfast, as skipping meals may alter your metabolism Do not use over-the-counter weight loss pills or gimmicks that claim rapid weight loss A healthy BMI (or body mass index) is between 18.5 and 24.9 You can calculate your ideal BMI at the [place] website ClubMonet[employer].fr

## 2018-01-14 NOTE — Assessment & Plan Note (Addendum)
Stable but using SABA more than 2x a week; start controller medicine,d iscussed rinsing mouth out

## 2018-01-14 NOTE — Progress Notes (Signed)
BP 110/72   Pulse 87   Temp 98.4 F (36.9 C)   Ht 5\' 1"  (1.549 m)   Wt 164 lb (74.4 kg)   SpO2 94%   BMI 30.99 kg/m    Subjective:    Patient ID: Deborah Blackwell, female    DOB: Jun 29, 1960, 57 y.o.   MRN: [id number]  HPI: Deborah Blackwell is a 57 y.o. female  Chief Complaint  Patient presents with  . Follow-up  . Rash    Onset 6 weeks ago, bilateral arms and legs, under breasts and both sides.     HPI Patient is here for follow-up  She has a rash; AC fossa, under breasts, in inguinal creases; uses lotriimin for 6 weeks and some improvement, even on the back of the leg; some pruritus  Type 2 diabetes; no dry mouth lately; last eye exam was good; no new problems with the feet  Lab Results  Component Value Date   HGBA1C 5.9 (H) 10/01/2017   Tough time of year; family loss; working with therapist, even doing volunteer work  COPD; tough time with the cold weather; using rescue inhaler 3x a week year-round  Trouble with sinuses, blowing bloody drainage from the left side; no fevers; left ear seems plugged; no sore throat  Hypothyroidism; moves bowels okay  Lab Results  Component Value Date   TSH 0.62 10/01/2017   High cholesterol Lab Results  Component Value Date   CHOL 200 (H) 10/01/2017   HDL 53 10/01/2017   LDLCALC 130 (H) 10/01/2017   TRIG 78 10/01/2017   CHOLHDL 3.8 10/01/2017   She lost 8 pounds until the holidays came up   Depression screen [facility] 2/9 01/14/2018 10/01/2017 10/01/2017 10/01/2017 01/26/2017  Decreased Interest 0 - 0 0 1  Down, Depressed, Hopeless 1 - 1 1 1   PHQ - 2 Score 1 - 1 1 2   Altered sleeping 1 - 0 - 2  Tired, decreased energy 1 - 2 - 2  Change in appetite 0 - 2 - 1  Feeling bad or failure about yourself  0 - 2 - 0  Trouble concentrating 0 - 2 - 0  Moving slowly or fidgety/restless 0 - 0 - 0  Suicidal thoughts 0 0 0 - 0  PHQ-9 Score 3 - 9 - 7  Difficult doing work/chores Not difficult at all - Somewhat difficult - -  Some recent data  might be hidden   Fall Risk  01/14/2018 10/01/2017 01/26/2017 01/23/2017 01/02/2017  Falls in the past year? 0 No No No No  Comment - - - - -  Number falls in past yr: - - - - -  Comment - - - - -  Injury with Fall? - - - - -  Comment - - - - -  Risk for fall due to : - - - - -  Follow up - - - - -    Relevant past medical, surgical, family and social history reviewed Past Medical History:  Diagnosis Date  . Angina at rest [facility])   . Arthritis   . Asthma   . Back pain with right-sided sciatica 09/30/2014  . Bipolar disorder ([place])   . Congenital scoliosis   . COPD (chronic obstructive pulmonary disease) ([place])   . DDD (degenerative disc disease), lumbar   . Depression   . Diabetes mellitus without complication ([place])   . GERD (gastroesophageal reflux disease)   . Gout   . History of kidney stones   .  History of scoliosis    was casted as a child  . Hyperlipidemia   . Hypertension   . Lumbar radiculopathy 09/30/2014  . Migraines   . Panic attack   . RA (rheumatoid arthritis) ([place])    Past Surgical History:  Procedure Laterality Date  . ABDOMINAL HYSTERECTOMY  2003   one ovary remains: due to heavy bleeding/endometrioma/cysts  . BACK SURGERY     and injections  . CARPAL TUNNEL RELEASE    . FOOT SURGERY Right    bone spur  . POLYPECTOMY     uterine   Family History  Problem Relation Age of Onset  . Alzheimer's disease Mother   . Dementia Mother   . Cancer Mother        colon  . Heart disease Mother   . Hypertension Mother   . Depression Mother   . Alzheimer's disease Father   . Dementia Father   . Hypertension Father   . Cancer Brother        lung  . Lung disease Brother   . Hypertension Sister   . Pneumonia Brother   . Heart failure Brother    Social History   Tobacco Use  . Smoking status: Former Smoker    Packs/day: 2.00    Years: 5.00    Pack years: 10.00    Types: Cigarettes    Last attempt to quit: 02/02/1991    Years since quitting: 26.9  .  Smokeless tobacco: Never Used  Substance Use Topics  . Alcohol use: No    Alcohol/week: 0.0 standard drinks  . Drug use: No     [facility] Visit from 01/14/2018 in [facility]  AUDIT-C Score  0      Interim medical history since last visit reviewed. Allergies and medications reviewed  Review of Systems Per HPI unless specifically indicated above     Objective:    BP 110/72   Pulse 87   Temp 98.4 F (36.9 C)   Ht 5\' 1"  (1.549 m)   Wt 164 lb (74.4 kg)   SpO2 94%   BMI 30.99 kg/m   Wt Readings from Last 3 Encounters:  01/14/18 164 lb (74.4 kg)  10/01/17 168 lb 8 oz (76.4 kg)  01/26/17 144 lb (65.3 kg)    Physical Exam Constitutional:      General: She is not in acute distress.    Appearance: She is well-developed. She is not diaphoretic.  HENT:     Head: Normocephalic and atraumatic.  Eyes:     General: No scleral icterus. Neck:     Thyroid: No thyromegaly.  Cardiovascular:     Rate and Rhythm: Normal rate and regular rhythm.     Heart sounds: Normal heart sounds. No murmur.  Pulmonary:     Effort: Pulmonary effort is normal. No respiratory distress.     Breath sounds: Normal breath sounds. No wheezing.  Abdominal:     General: Bowel sounds are normal. There is no distension.     Palpations: Abdomen is soft.  Skin:    General: Skin is warm and dry.     Coloration: Skin is not pale.     Comments: Erythematous rash with clearly demarcated borders, central clearing in the right AC fossa, under breasts  Neurological:     Mental Status: She is alert.  Psychiatric:        Behavior: Behavior normal.        Thought Content: Thought content normal.  Judgment: Judgment normal.     Comments: Good eye contact with examiner; briefly appropriate depressed when discussing this time of year; good hygiene    Diabetic Foot Form - Detailed   Diabetic Foot Exam - detailed Diabetic Foot exam was performed with the following findings:  Yes 01/14/2018   3:22 PM  Visual Foot Exam completed.:  Yes  Pulse Foot Exam completed.:  Yes  Right Dorsalis Pedis:  Present Left Dorsalis Pedis:  Present  Sensory Foot Exam Completed.:  Yes Semmes-Weinstein Monofilament Test R Site 1-Great Toe:  Pos L Site 1-Great Toe:  Pos        Results for orders placed or performed in visit on 10/02/17  HM DIABETES EYE EXAM  Result Value Ref Range   HM Diabetic Eye Exam No Retinopathy No Retinopathy      Assessment & Plan:   Problem List Items Addressed This Visit      Respiratory   COPD (chronic obstructive pulmonary disease) (HCC) (Chronic)    Stable but using SABA more than 2x a week; start controller medicine,d iscussed rinsing mouth out      Relevant Medications   fluticasone (FLOVENT HFA) 110 MCG/ACT inhaler     Endocrine   Type 2 diabetes, controlled, with neuropathy ([place]) - Primary (Chronic)    Foot exam by MD today; eye exam UTD; limiting sugary drinks      Relevant Medications   valsartan (DIOVAN) 80 MG tablet   atorvastatin (LIPITOR) 40 MG tablet   Hypothyroidism (Chronic)    Stable; continue current dose      Relevant Medications   levothyroxine (SYNTHROID, LEVOTHROID) 50 MCG tablet     Other   Hyperlipidemia (Chronic)   Relevant Medications   valsartan (DIOVAN) 80 MG tablet   atorvastatin (LIPITOR) 40 MG tablet   Abnormal grief reaction    Supportive listening, encouragement; she is seeing someone for this       Other Visit Diagnoses    Acute non-recurrent maxillary sinusitis       antibiotics; yeast infection medicine if needed (do not take statin x 3 days if she takes this, she verbalized understanding)   Relevant Medications   fluconazole (DIFLUCAN) 150 MG tablet   amoxicillin-clavulanate (AUGMENTIN) 875-125 MG tablet   Tinea corporis       OTC med not effective completely, though helping; will send in Rx, may need tx for 4 weeks   Relevant Medications   clotrimazole (LOTRIMIN) 1 % cream   fluconazole (DIFLUCAN)  150 MG tablet   econazole nitrate 1 % cream       Follow up plan: Return in about 3 months (around 04/16/2018) for follow-up visit with Dr. Sanda Klein, nonfasting.  An after-visit summary was printed and given to the patient at [place].  Please see the patient instructions which may contain other information and recommendations beyond what is mentioned above in the assessment and plan.  Meds ordered this encounter  Medications  . valsartan (DIOVAN) 80 MG tablet    Sig: Take 1 tablet (80 mg total) by mouth daily. For blood pressure; this REPLACES losartan    Dispense:  30 tablet    Refill:  1  . levothyroxine (SYNTHROID, LEVOTHROID) 50 MCG tablet    Sig: TAKE 1 TABLET BY MOUTH ONCE A DAY BEFORE BREAKFAST    Dispense:  30 tablet    Refill:  2  . atorvastatin (LIPITOR) 40 MG tablet    Sig: Take 1 tablet (40 mg total) by mouth at bedtime.  Dispense:  30 tablet    Refill:  1    Increasing dose  . fluconazole (DIFLUCAN) 150 MG tablet    Sig: Take 1 tablet (150 mg total) by mouth daily. Do not take cholesterol medicine (statin) for 3 days    Dispense:  1 tablet    Refill:  0  . amoxicillin-clavulanate (AUGMENTIN) 875-125 MG tablet    Sig: Take 1 tablet by mouth 2 (two) times daily.    Dispense:  20 tablet    Refill:  0  . fluticasone (FLOVENT HFA) 110 MCG/ACT inhaler    Sig: Inhale 1 puff into the lungs 2 (two) times daily. Controller, every day medicine    Dispense:  1 Inhaler    Refill:  12  . econazole nitrate 1 % cream    Sig: Apply topically daily.    Dispense:  15 g    Refill:  0    No orders of the defined types were placed in this encounter.

## 2018-01-15 ENCOUNTER — Other Ambulatory Visit: Payer: Self-pay | Admitting: Family Medicine

## 2018-01-15 MED ORDER — ATORVASTATIN CALCIUM 80 MG PO TABS: 80.0000 mg | ORAL_TABLET | Freq: Every day | ORAL | 1 refills | Status: DC

## 2018-02-27 ENCOUNTER — Telehealth: Payer: Self-pay | Admitting: Family Medicine

## 2018-02-27 NOTE — Telephone Encounter (Signed)
I called the patient to schedule Medicare AWV-I with Nurse Health Advisor, [place], but there was no answer and no option to leave a message.  If the patient calls back, please schedule Medicare Wellness Visit with Nurse Health Advisor. VDM (DD)

## 2018-03-13 ENCOUNTER — Other Ambulatory Visit: Payer: Self-pay | Admitting: Family Medicine

## 2018-03-20 ENCOUNTER — Other Ambulatory Visit: Payer: Self-pay | Admitting: Family Medicine

## 2018-03-20 NOTE — Telephone Encounter (Signed)
Lab Results  Component Value Date   TSH 0.62 10/01/2017

## 2018-03-22 ENCOUNTER — Ambulatory Visit (INDEPENDENT_AMBULATORY_CARE_PROVIDER_SITE_OTHER): Payer: Medicare Other

## 2018-03-22 VITALS — BP 116/72 | HR 80 | Temp 97.9°F | Resp 16 | Ht 61.0 in | Wt 164.8 lb

## 2018-03-22 DIAGNOSIS — Z0000 Encounter for general adult medical examination without abnormal findings: Secondary | ICD-10-CM

## 2018-03-22 DIAGNOSIS — Z780 Asymptomatic menopausal state: Secondary | ICD-10-CM

## 2018-03-22 NOTE — Progress Notes (Signed)
Subjective:   Deborah Blackwell is a 58 y.o. female who presents for an Initial Medicare Annual Wellness Visit.  Review of Systems    Cardiac Risk Factors include: diabetes mellitus;hypertension;dyslipidemia;obesity (BMI >30kg/m2)     Objective:    Today's Vitals   03/22/18 1402 03/22/18 1403  BP: 116/72   Pulse: 80   Resp: 16   Temp: 97.9 F (36.6 C)   TempSrc: Oral   SpO2: 95%   Weight: 164 lb 12.8 oz (74.8 kg)   Height: 5\' 1"  (1.549 m)   PainSc:  3    Body mass index is 31.14 kg/m.  Advanced Directives 03/22/2018 05/02/2016 10/18/2015 09/30/2015 07/16/2015 06/18/2015 05/21/2015  Does Patient Have a Medical Advance Directive? No No No No No No No  Would patient like information on creating a medical advance directive? No - Patient declined - No - patient declined information No - patient declined information - - -  Some encounter information is confidential and restricted. Go to Review Flowsheets activity to see all data.    Current Medications (verified) Outpatient Encounter Medications as of 03/22/2018  Medication Sig  . albuterol (VENTOLIN HFA) 108 (90 Base) MCG/ACT inhaler Inhale 2 puffs into the lungs every 4 (four) hours as needed for wheezing or shortness of breath.  . ALPRAZolam (XANAX) 1 MG tablet Take 1 tablet by mouth 2 (two) times daily.  Marland Kitchen aspirin-acetaminophen-caffeine (EXCEDRIN MIGRAINE) 250-250-65 MG tablet Take 2 tablets by mouth every 6 (six) hours as needed for headache.  Marland Kitchen atorvastatin (LIPITOR) 80 MG tablet Take 1 tablet (80 mg total) by mouth at bedtime.  . fluticasone (FLONASE) 50 MCG/ACT nasal spray Place 2 sprays into both nostrils daily.  Marland Kitchen levothyroxine (SYNTHROID, LEVOTHROID) 50 MCG tablet TAKE 1 TABLET BY MOUTH EVERY DAY BEFORE BREAKFAST  . sertraline (ZOLOFT) 100 MG tablet Take 150 mg by mouth daily. 1.5 tablets by mouth daily  . valsartan (DIOVAN) 80 MG tablet TAKE 1 TABLET (80 MG TOTAL) BY MOUTH DAILY. FOR BLOOD PRESSURE THIS REPLACES LOSARTAN  .  [DISCONTINUED] clotrimazole (LOTRIMIN) 1 % cream Apply 1 application topically 2 (two) times daily.  . [DISCONTINUED] econazole nitrate 1 % cream Apply topically daily.  . [DISCONTINUED] fluconazole (DIFLUCAN) 150 MG tablet Take 1 tablet (150 mg total) by mouth daily. Do not take cholesterol medicine (statin) for 3 days  . [DISCONTINUED] fluticasone (FLOVENT HFA) 110 MCG/ACT inhaler Inhale 1 puff into the lungs 2 (two) times daily. Controller, every day medicine  . [DISCONTINUED] phenazopyridine (PYRIDIUM) 200 MG tablet Take 1 tablet (200 mg total) by mouth 3 (three) times daily as needed for pain. (Patient not taking: Reported on 10/01/2017)  . [DISCONTINUED] QUEtiapine (SEROQUEL) 50 MG tablet Take 1 tablet (50 mg total) by mouth at bedtime. STOP trazodone  . [DISCONTINUED] sertraline (ZOLOFT) 50 MG tablet Take 1 tablet (50 mg total) by mouth daily. TAKE ONE TABLET BY MOUTH DAILY   No facility-administered encounter medications on file as of 03/22/2018.     Allergies (verified) Contrast media [iodinated diagnostic agents]; Aspirin; and Macrobid [nitrofurantoin monohyd macro]   History: Past Medical History:  Diagnosis Date  . Angina at rest [facility])   . Arthritis   . Asthma   . Back pain with right-sided sciatica 09/30/2014  . Bipolar disorder ([place])   . Congenital scoliosis   . COPD (chronic obstructive pulmonary disease) ([place])   . DDD (degenerative disc disease), lumbar   . Depression   . Diabetes mellitus without complication ([place])   .  GERD (gastroesophageal reflux disease)   . Gout   . History of kidney stones   . History of scoliosis    was casted as a child  . Hyperlipidemia   . Hypertension   . Lumbar radiculopathy 09/30/2014  . Migraines   . Panic attack   . RA (rheumatoid arthritis) ([place])    Past Surgical History:  Procedure Laterality Date  . ABDOMINAL HYSTERECTOMY  2003   one ovary remains: due to heavy bleeding/endometrioma/cysts  . BACK SURGERY     and injections  .  CARPAL TUNNEL RELEASE    . FOOT SURGERY Right    bone spur  . POLYPECTOMY     uterine   Family History  Problem Relation Age of Onset  . Alzheimer's disease Mother   . Dementia Mother   . Cancer Mother        colon  . Heart disease Mother   . Hypertension Mother   . Depression Mother   . Alzheimer's disease Father   . Dementia Father   . Hypertension Father   . Cancer Brother        lung  . Lung disease Brother   . Hypertension Sister   . Pneumonia Brother   . Heart failure Brother    Social History   Socioeconomic History  . Marital status: Widowed    Spouse name: Not on file  . Number of children: 0  . Years of education: Not on file  . Highest education level: Associate degree: academic program  Occupational History  . Not on file  Social Needs  . Financial resource strain: Somewhat hard  . Food insecurity:    Worry: Never true    Inability: Never true  . Transportation needs:    Medical: No    Non-medical: No  Tobacco Use  . Smoking status: Former Smoker    Packs/day: 2.00    Years: 5.00    Pack years: 10.00    Types: Cigarettes    Last attempt to quit: 02/02/1991    Years since quitting: 27.1  . Smokeless tobacco: Never Used  Substance and Sexual Activity  . Alcohol use: No    Alcohol/week: 0.0 standard drinks  . Drug use: No  . Sexual activity: Not Currently  Lifestyle  . Physical activity:    Days per week: 7 days    Minutes per session: 20 min  . Stress: To some extent  Relationships  . Social connections:    Talks on phone: More than three times a week    Gets together: Three times a week    Attends religious service: More than 4 times per year    Active member of club or organization: No    Attends meetings of clubs or organizations: Never    Relationship status: Widowed  Other Topics Concern  . Not on file  Social History Narrative  . Not on file    Tobacco Counseling Counseling given: Not Answered   Clinical  Intake:  Pre-visit preparation completed: Yes  Pain : 0-10 Pain Score: 3  Pain Type: Acute pain Pain Location: Head(headache) Pain Descriptors / Indicators: Aching Pain Onset: More than a month ago Pain Frequency: Intermittent     BMI - recorded: 31.14 Nutritional Status: BMI > 30  Obese Nutritional Risks: None Diabetes: Yes CBG done?: No Did pt. bring in CBG monitor from home?: No  Nutrition Risk Assessment:  Has the patient had any N/V/D within the last 2 months?  No  Does  the patient have any non-healing wounds?  No  Has the patient had any unintentional weight loss or weight gain?  No   Diabetes:  Is the patient diabetic?  Yes  If diabetic, was a CBG obtained today?  No  Did the patient bring in their glucometer from home?  No  How often do you monitor your CBG's? Pt no longer actively checks blood sugar.   Financial Strains and Diabetes Management:  Are you having any financial strains with the device, your supplies or your medication? No .  Does the patient want to be seen by Chronic Care Management for management of their diabetes?  No  Would the patient like to be referred to a Nutritionist or for Diabetic Management?  No   Diabetic Exams:  Diabetic Eye Exam: Completed 10/02/17.   Diabetic Foot Exam: Completed 01/14/18.   How often do you need to have someone help you when you read instructions, pamphlets, or other written materials from your doctor or pharmacy?: 1 - Never  Interpreter Needed?: No  Information entered by :: Clemetine Marker LPN   Activities of Daily Living In your present state of health, do you have any difficulty performing the following activities: 03/22/2018 01/14/2018  Hearing? N N  Comment declines hearing aids -  Vision? N N  Comment wears glasses -  Difficulty concentrating or making decisions? N N  Walking or climbing stairs? N Y  Dressing or bathing? N N  Doing errands, shopping? N N  Preparing Food and eating ? N -  Using the  Toilet? N -  In the past six months, have you accidently leaked urine? Y -  Comment wears liner for protection -  Do you have problems with loss of bowel control? N -  Managing your Medications? N -  Managing your Finances? N -  Housekeeping or managing your Housekeeping? N -  Some recent data might be hidden     Immunizations and Health Maintenance Immunization History  Administered Date(s) Administered  . Influenza,inj,Quad PF,6+ Mos 09/30/2015, 10/01/2017  . Influenza-Unspecified 09/17/2014, 10/11/2016  . Pneumococcal Conjugate-13 01/16/2013  . Pneumococcal Polysaccharide-23 01/17/2011  . Tdap 01/16/2009   Health Maintenance Due  Topic Date Due  . MAMMOGRAM  01/16/2013  . COLONOSCOPY  01/16/2017    Patient Care Team: Arnetha Courser, MD as PCP - General (Family Medicine) Lance Bosch, MD as Referring Physician (Pain Medicine) Marjie Skiff, MD as Consulting Physician (Psychiatry)  Indicate any recent Medical Services you may have received from other than [facility] providers in the past year (date may be approximate).     Assessment:   This is a routine wellness examination for [place].  Hearing/Vision screen Hearing Screening Comments: Pt denies hearing difficulty  Vision Screening Comments: Annual vision screenings done by [facility] and exercise activities discussed: Current Exercise Habits: Home exercise routine, Type of exercise: walking, Time (Minutes): 20, Frequency (Times/Week): 7, Weekly Exercise (Minutes/Week): 140, Intensity: Mild, Exercise limited by: None identified  Goals   None    Depression Screen PHQ 2/9 Scores 03/22/2018 01/14/2018 10/01/2017 10/01/2017 01/26/2017 01/23/2017 01/02/2017  PHQ - 2 Score 2 1 1 1 2 4 6   PHQ- 9 Score 7 3 9  - 7 6 19   Exception Documentation - - - - - - -    Fall Risk Fall Risk  03/22/2018 01/14/2018 10/01/2017 01/26/2017 01/23/2017  Falls in the past year? 0 0 No No No  Comment - - - - -  Number falls in  past yr: 0 - - - -  Comment - - - - -  Injury with Fall? 0 - - - -  Comment - - - - -  Risk for fall due to : - - - - -  Follow up Falls prevention discussed - - - -    FALL RISK PREVENTION PERTAINING TO THE HOME:  Any stairs in or around the home? No  If so, do they handrails? No   Home free of loose throw rugs in walkways, pet beds, electrical cords, etc? Yes  Adequate lighting in your home to reduce risk of falls? Yes   ASSISTIVE DEVICES UTILIZED TO PREVENT FALLS:  Life alert? No  Use of a cane, walker or w/c? No  Grab bars in the bathroom? Yes  Shower chair or bench in shower? Yes  Elevated toilet seat or a handicapped toilet? Yes   DME ORDERS:  DME order needed?  No   TIMED UP AND GO:  Was the test performed? Yes .  Length of time to ambulate 10 feet: 6 sec.   GAIT:  Appearance of gait: Gait stead-fast and without the use of an assistive device.   Education: Fall risk prevention has been discussed.  Intervention(s) required? No   Cognitive Function:     6CIT Screen 03/22/2018  What Year? 0 points  What month? 0 points  What time? 0 points  Count back from 20 0 points  Months in reverse 0 points  Repeat phrase 0 points  Total Score 0    Screening Tests Health Maintenance  Topic Date Due  . MAMMOGRAM  01/16/2013  . COLONOSCOPY  01/16/2017  . Hepatitis C Screening  01/15/2019 (Originally [birth year])  . HIV Screening  01/15/2019 (Originally 07/18/1975)  . HEMOGLOBIN A1C  04/01/2018  . OPHTHALMOLOGY EXAM  10/03/2018  . FOOT EXAM  01/15/2019  . TETANUS/TDAP  01/17/2019  . INFLUENZA VACCINE  Completed  . PNEUMOCOCCAL POLYSACCHARIDE VACCINE AGE 62-64 HIGH RISK  Completed  . PAP SMEAR-Modifier  Discontinued    Qualifies for Shingles Vaccine? Yes  . Due for Shingrix. Education has been provided regarding the importance of this vaccine. Pt has been advised to call insurance company to determine out of pocket expense. Advised may also receive vaccine at local  pharmacy or Health Dept. Verbalized acceptance and understanding.  Tdap: Up to date  Flu Vaccine: Up to date  Pneumococcal Vaccine: Up to date   Cancer Screenings:  Colorectal Screening: Completed 2014. Repeat every 5 years; Patient states she is scheduled for colonoscopy this month but does not recall who the provider is. I was unable to locate any future encounter information within the [facility].   Mammogram: Completed 2014. Repeat every year. Ordered 10/01/17. Pt provided with contact information and advised to call to schedule appt.   Bone Density: Completed 2011. Results reflect OSTEOPOROSIS per patient. Report unavailable. Repeat every 2 years. Ordered today. Pt provided with contact information and advised to call to schedule appt.   Lung Cancer Screening: (Low Dose CT Chest recommended if Age 28-80 years, 30 pack-year currently smoking OR have quit w/in 15years.) does not qualify.    Additional Screening:  Hepatitis C Screening: does qualify; postponed  Vision Screening: Recommended annual ophthalmology exams for early detection of glaucoma and other disorders of the eye. Is the patient up to date with their annual eye exam?  Yes  Who is the provider or what is the name of the office in which  the pt attends annual eye exams? [facility] Screening: Recommended annual dental exams for proper oral hygiene  Community Resource Referral:  CRR required this visit?  No      Plan:    I have personally reviewed and addressed the Medicare Annual Wellness questionnaire and have noted the following in the patient's chart:  A. Medical and social history B. Use of alcohol, tobacco or illicit drugs  C. Current medications and supplements D. Functional ability and status E.  Nutritional status F.  Physical activity G. Advance directives H. List of other physicians I.  Hospitalizations, surgeries, and ER visits in previous 12 months J.  [place] such  as hearing and vision if needed, cognitive and depression L. Referrals and appointments   In addition, I have reviewed and discussed with patient certain preventive protocols, quality metrics, and best practice recommendations. A written personalized care plan for preventive services as well as general preventive health recommendations were provided to patient.   Signed,  Clemetine Marker, LPN Nurse Health Advisor   Nurse Notes: pt c/o frequent headaches and nose bleeds. She is using fluticasone nasal spray daily and using a humdifier at night but c/o congestion primarily on left side of her nose. Advised pt to schedule follow up appt to discuss, pt to see Dr. Sanda Klein on 04/16/18.

## 2018-03-22 NOTE — Patient Instructions (Signed)
Deborah Blackwell , Thank you for taking time to come for your Medicare Wellness Visit. I appreciate your ongoing commitment to your health goals. Please review the following plan we discussed and let me know if I can assist you in the future.   Screening recommendations/referrals: Colonoscopy: done 2014. Please follow up on scheduling for later this month.  Mammogram: done 2014. Please call [phone] to schedule your mammogram and bone density screening.  Bone Density: done 2011 Recommended yearly ophthalmology/optometry visit for glaucoma screening and checkup Recommended yearly dental visit for hygiene and checkup  Vaccinations: Influenza vaccine: done 10/01/17 Pneumococcal vaccine: done 05/07/13 Tdap vaccine: done 11/12/09 Shingles vaccine: Shingrix discussed. Please contact your pharmacy for coverage information.   Advanced directives: Advance directive discussed with you today. Even though you declined this today please call our office should you change your mind and we can give you the proper paperwork for you to fill out.  Conditions/risks identified: Recommend healthy eating and physical activity to maintain A1c.   Next appointment: Please follow up in one year for your Medicare Annual Wellness visit.     Preventive Care 37 Years and Older, Female Preventive care refers to lifestyle choices and visits with your health care provider that can promote health and wellness. What does preventive care include?  A yearly physical exam. This is also called an annual well check.  Dental exams once or twice a year.  Routine eye exams. Ask your health care provider how often you should have your eyes checked.  Personal lifestyle choices, including:  Daily care of your teeth and gums.  Regular physical activity.  Eating a healthy diet.  Avoiding tobacco and drug use.  Limiting alcohol use.  Practicing safe sex.  Taking low-dose aspirin every day.  Taking vitamin and mineral  supplements as recommended by your health care provider. What happens during an annual well check? The services and screenings done by your health care provider during your annual well check will depend on your age, overall health, lifestyle risk factors, and family history of disease. Counseling  Your health care provider may ask you questions about your:  Alcohol use.  Tobacco use.  Drug use.  Emotional well-being.  Home and relationship well-being.  Sexual activity.  Eating habits.  History of falls.  Memory and ability to understand (cognition).  Work and work Statistician.  Reproductive health. Screening  You may have the following tests or measurements:  Height, weight, and BMI.  Blood pressure.  Lipid and cholesterol levels. These may be checked every 5 years, or more frequently if you are over 96 years old.  Skin check.  Lung cancer screening. You may have this screening every year starting at age 4 if you have a 30-pack-year history of smoking and currently smoke or have quit within the past 15 years.  Fecal occult blood test (FOBT) of the stool. You may have this test every year starting at age 10.  Flexible sigmoidoscopy or colonoscopy. You may have a sigmoidoscopy every 5 years or a colonoscopy every 10 years starting at age 75.  Hepatitis C blood test.  Hepatitis B blood test.  Sexually transmitted disease (STD) testing.  Diabetes screening. This is done by checking your blood sugar (glucose) after you have not eaten for a while (fasting). You may have this done every 1-3 years.  Bone density scan. This is done to screen for osteoporosis. You may have this done starting at age 15.  Mammogram. This may be done every 1-2  years. Talk to your health care provider about how often you should have regular mammograms. Talk with your health care provider about your test results, treatment options, and if necessary, the need for more tests. Vaccines  Your  health care provider may recommend certain vaccines, such as:  Influenza vaccine. This is recommended every year.  Tetanus, diphtheria, and acellular pertussis (Tdap, Td) vaccine. You may need a Td booster every 10 years.  Zoster vaccine. You may need this after age 42.  Pneumococcal 13-valent conjugate (PCV13) vaccine. One dose is recommended after age 10.  Pneumococcal polysaccharide (PPSV23) vaccine. One dose is recommended after age 88. Talk to your health care provider about which screenings and vaccines you need and how often you need them. This information is not intended to replace advice given to you by your health care provider. Make sure you discuss any questions you have with your health care provider. Document Released: 01/29/2015 Document Revised: 09/22/2015 Document Reviewed: 11/03/2014 Elsevier Interactive Patient Education  2017 [place] Prevention in the Home Falls can cause injuries. They can happen to people of all ages. There are many things you can do to make your home safe and to help prevent falls. What can I do on the outside of my home?  Regularly fix the edges of walkways and driveways and fix any cracks.  Remove anything that might make you trip as you walk through a door, such as a raised step or threshold.  Trim any bushes or trees on the path to your home.  Use bright outdoor lighting.  Clear any walking paths of anything that might make someone trip, such as rocks or tools.  Regularly check to see if handrails are loose or broken. Make sure that both sides of any steps have handrails.  Any raised decks and porches should have guardrails on the edges.  Have any leaves, snow, or ice cleared regularly.  Use sand or salt on walking paths during winter.  Clean up any spills in your garage right away. This includes oil or grease spills. What can I do in the bathroom?  Use night lights.  Install grab bars by the toilet and in the tub and  shower. Do not use towel bars as grab bars.  Use non-skid mats or decals in the tub or shower.  If you need to sit down in the shower, use a plastic, non-slip stool.  Keep the floor dry. Clean up any water that spills on the floor as soon as it happens.  Remove soap buildup in the tub or shower regularly.  Attach bath mats securely with double-sided non-slip rug tape.  Do not have throw rugs and other things on the floor that can make you trip. What can I do in the bedroom?  Use night lights.  Make sure that you have a light by your bed that is easy to reach.  Do not use any sheets or blankets that are too big for your bed. They should not hang down onto the floor.  Have a firm chair that has side arms. You can use this for support while you get dressed.  Do not have throw rugs and other things on the floor that can make you trip. What can I do in the kitchen?  Clean up any spills right away.  Avoid walking on wet floors.  Keep items that you use a lot in easy-to-reach places.  If you need to reach something above you, use a strong step  stool that has a grab bar.  Keep electrical cords out of the way.  Do not use floor polish or wax that makes floors slippery. If you must use wax, use non-skid floor wax.  Do not have throw rugs and other things on the floor that can make you trip. What can I do with my stairs?  Do not leave any items on the stairs.  Make sure that there are handrails on both sides of the stairs and use them. Fix handrails that are broken or loose. Make sure that handrails are as long as the stairways.  Check any carpeting to make sure that it is firmly attached to the stairs. Fix any carpet that is loose or worn.  Avoid having throw rugs at the top or bottom of the stairs. If you do have throw rugs, attach them to the floor with carpet tape.  Make sure that you have a light switch at the top of the stairs and the bottom of the stairs. If you do not  have them, ask someone to add them for you. What else can I do to help prevent falls?  Wear shoes that:  Do not have high heels.  Have rubber bottoms.  Are comfortable and fit you well.  Are closed at the toe. Do not wear sandals.  If you use a stepladder:  Make sure that it is fully opened. Do not climb a closed stepladder.  Make sure that both sides of the stepladder are locked into place.  Ask someone to hold it for you, if possible.  Clearly mark and make sure that you can see:  Any grab bars or handrails.  First and last steps.  Where the edge of each step is.  Use tools that help you move around (mobility aids) if they are needed. These include:  Canes.  Walkers.  Scooters.  Crutches.  Turn on the lights when you go into a dark area. Replace any light bulbs as soon as they burn out.  Set up your furniture so you have a clear path. Avoid moving your furniture around.  If any of your floors are uneven, fix them.  If there are any pets around you, be aware of where they are.  Review your medicines with your doctor. Some medicines can make you feel dizzy. This can increase your chance of falling. Ask your doctor what other things that you can do to help prevent falls. This information is not intended to replace advice given to you by your health care provider. Make sure you discuss any questions you have with your health care provider. Document Released: 10/29/2008 Document Revised: 06/10/2015 Document Reviewed: 02/06/2014 Elsevier Interactive Patient Education  2017 [employer].

## 2018-04-16 ENCOUNTER — Ambulatory Visit: Payer: Self-pay | Admitting: Family Medicine

## 2018-05-18 ENCOUNTER — Other Ambulatory Visit: Payer: Self-pay | Admitting: Family Medicine

## 2018-05-20 ENCOUNTER — Other Ambulatory Visit: Payer: Self-pay | Admitting: Family Medicine

## 2018-05-20 ENCOUNTER — Other Ambulatory Visit: Payer: Self-pay | Admitting: Nurse Practitioner

## 2018-05-20 ENCOUNTER — Telehealth: Payer: Self-pay | Admitting: Family Medicine

## 2018-05-20 MED ORDER — VALSARTAN 80 MG PO TABS: 80.0000 mg | ORAL_TABLET | Freq: Every day | ORAL | 0 refills | Status: DC

## 2018-05-20 NOTE — Telephone Encounter (Signed)
Please request routine follow-up for further refills

## 2018-05-20 NOTE — Telephone Encounter (Signed)
Pt needs follow Up LVM

## 2018-05-20 NOTE — Telephone Encounter (Signed)
Please call to schedule follow-up.

## 2018-05-21 NOTE — Telephone Encounter (Signed)
LVM for pt to call the office to schedule an appt. °

## 2018-06-18 ENCOUNTER — Other Ambulatory Visit: Payer: Self-pay | Admitting: Family Medicine

## 2018-07-01 ENCOUNTER — Other Ambulatory Visit: Payer: Self-pay | Admitting: Family Medicine

## 2018-07-01 NOTE — Telephone Encounter (Signed)
Please schedule patient for follow up in the next 7 days.

## 2018-07-02 NOTE — Telephone Encounter (Signed)
lvm for pt to schedule appt for the next 7 days. Also informed that a limited prescription has been sent to pharmacy

## 2018-08-08 ENCOUNTER — Other Ambulatory Visit: Payer: Self-pay | Admitting: Family Medicine

## 2018-08-08 ENCOUNTER — Other Ambulatory Visit: Payer: Self-pay | Admitting: Nurse Practitioner

## 2018-08-23 ENCOUNTER — Other Ambulatory Visit: Payer: Self-pay | Admitting: Family Medicine

## 2018-09-13 ENCOUNTER — Other Ambulatory Visit: Payer: Self-pay | Admitting: Family Medicine

## 2018-09-13 NOTE — Telephone Encounter (Signed)
Appointment within 2 weeks.

## 2018-09-13 NOTE — Telephone Encounter (Signed)
Requested medication (s) are due for refill today: yes  Requested medication (s) are on the active medication list:yes  Last refill:  08/09/2018  Future visit scheduled: no  Notes to clinic: review for refill   Requested Prescriptions  Pending Prescriptions Disp Refills   valsartan (DIOVAN) 80 MG tablet [Pharmacy Med Name: VALSARTAN 80 MG TABLET] 15 tablet 0    Sig: TAKE 1 TABLET (80 MG TOTAL) BY MOUTH DAILY. FOR BLOOD PRESSURE THIS REPLACES LOSARTAN     Cardiovascular:  Angiotensin Receptor Blockers Failed - 09/13/2018  8:00 AM      Failed - Cr in normal range and within 180 days    Creat  Date Value Ref Range Status  10/01/2017 0.63 0.50 - 1.05 mg/dL Final    Comment:    For patients >25 years of age, the reference limit for Creatinine is approximately 13% higher for people identified as African-American. .          Failed - K in normal range and within 180 days    Potassium  Date Value Ref Range Status  10/01/2017 4.8 3.5 - 5.3 mmol/L Final         Passed - Patient is not pregnant      Passed - Last BP in normal range    BP Readings from Last 1 Encounters:  03/22/18 116/72         Passed - Valid encounter within last 6 months    Recent Outpatient Visits          8 months ago Type 2 diabetes, controlled, with neuropathy ([place])   [place], Satira Anis, MD   11 months ago Type 2 diabetes, controlled, with neuropathy [facility])   [facility], Satira Anis, MD   1 year ago Acute cystitis with hematuria   [place], Satira Anis, MD   1 year ago Upper respiratory infection, viral   [place], Satira Anis, MD   1 year ago Grief reaction   [place], Satira Anis, MD      Future Appointments            In 6 months [facility], [facility]

## 2018-09-13 NOTE — Telephone Encounter (Signed)
lvm informing pt that prescription has been sent topharmacy and for her to schedule appt within the next 2 weeks

## 2018-10-22 ENCOUNTER — Telehealth: Payer: Self-pay | Admitting: Family Medicine

## 2018-10-22 NOTE — Telephone Encounter (Signed)
Medication refill: valsartan (DIOVAN) 80 MG tablet [id number]    Pt has schedule appointment for 10/31/18.    Pharmacy:  [id number] - [place], [address] [phone] (Phone) [phone] (Fax)

## 2018-10-23 MED ORDER — VALSARTAN 80 MG PO TABS: 80.0000 mg | ORAL_TABLET | Freq: Every day | ORAL | 0 refills | Status: DC

## 2018-10-23 NOTE — Telephone Encounter (Signed)
Please make sure she has appointment

## 2018-10-23 NOTE — Telephone Encounter (Signed)
8 tablets provided - must attend appt on 10/31/2018 for additional refills.

## 2018-10-31 ENCOUNTER — Other Ambulatory Visit: Payer: Self-pay

## 2018-10-31 ENCOUNTER — Encounter: Payer: Self-pay | Admitting: Family Medicine

## 2018-10-31 ENCOUNTER — Ambulatory Visit (INDEPENDENT_AMBULATORY_CARE_PROVIDER_SITE_OTHER): Payer: Medicare Other | Admitting: Family Medicine

## 2018-10-31 VITALS — BP 130/80 | HR 88

## 2018-10-31 DIAGNOSIS — J441 Chronic obstructive pulmonary disease with (acute) exacerbation: Secondary | ICD-10-CM | POA: Diagnosis not present

## 2018-10-31 DIAGNOSIS — J069 Acute upper respiratory infection, unspecified: Secondary | ICD-10-CM

## 2018-10-31 DIAGNOSIS — Z20822 Contact with and (suspected) exposure to covid-19: Secondary | ICD-10-CM

## 2018-10-31 MED ORDER — BUDESONIDE-FORMOTEROL FUMARATE 160-4.5 MCG/ACT IN AERO: 2.0000 | INHALATION_SPRAY | Freq: Two times a day (BID) | RESPIRATORY_TRACT | 12 refills | Status: DC

## 2018-10-31 MED ORDER — LEVOCETIRIZINE DIHYDROCHLORIDE 5 MG PO TABS: 5.0000 mg | ORAL_TABLET | Freq: Every evening | ORAL | 3 refills | Status: DC

## 2018-10-31 MED ORDER — AZITHROMYCIN 250 MG PO TABS: 250.0000 mg | ORAL_TABLET | Freq: Every day | ORAL | 0 refills | Status: DC

## 2018-10-31 MED ORDER — PROMETHAZINE-DM 6.25-15 MG/5ML PO SYRP: 5.0000 mL | ORAL_SOLUTION | Freq: Four times a day (QID) | ORAL | 0 refills | Status: DC | PRN

## 2018-10-31 MED ORDER — LEVOTHYROXINE SODIUM 50 MCG PO TABS: 50.0000 ug | ORAL_TABLET | Freq: Every day | ORAL | 0 refills | Status: DC

## 2018-10-31 MED ORDER — IPRATROPIUM-ALBUTEROL 0.5-2.5 (3) MG/3ML IN SOLN: 3.0000 mL | RESPIRATORY_TRACT | 0 refills | Status: DC | PRN

## 2018-10-31 MED ORDER — ATORVASTATIN CALCIUM 80 MG PO TABS: 80.0000 mg | ORAL_TABLET | Freq: Every day | ORAL | 1 refills | Status: DC

## 2018-10-31 MED ORDER — PREDNISONE 20 MG PO TABS: 40.0000 mg | ORAL_TABLET | Freq: Every day | ORAL | 0 refills | Status: DC

## 2018-10-31 MED ORDER — VALSARTAN 80 MG PO TABS: 80.0000 mg | ORAL_TABLET | Freq: Every day | ORAL | 0 refills | Status: DC

## 2018-10-31 NOTE — Progress Notes (Signed)
Name: Deborah Blackwell   MRN: [id number]    DOB: March 10, 1960   Date:10/31/2018       Progress Note  Subjective:    Chief Complaint  Chief Complaint  Patient presents with   Follow-up   Hypertension   Hypothyroidism   Medication Refill   URI    onset 3 days cough, nasal congestion    I connected with  Deborah Blackwell on 10/31/18 at  2:00 PM EDT by telephone and verified that I am speaking with the correct person using two identifiers.  I discussed the limitations, risks, security and privacy concerns of performing an evaluation and management service by telephone and the availability of in person appointments. Staff also discussed with the patient that there may be a patient responsible charge related to this service. Patient Location: home Provider Location: [facility] clinic Additional Individuals present: None  URI  This is a new problem. Episode onset: 3 days ago. There has been no fever. Associated symptoms include congestion, coughing, headaches, rhinorrhea, sinus pain and sneezing. Pertinent negatives include no abdominal pain, chest pain, diarrhea, dysuria, ear pain, joint pain, joint swelling, nausea, neck pain, rash, sore throat, vomiting or wheezing. She has tried antihistamine and inhaler use (nasal spray, cough meds, albuterol inhaler, daily maintenance inhaler, mucinex) for the symptoms.  Cough This is a recurrent problem. Episode onset: 3 day. The problem has been gradually worsening. The cough is productive of sputum (white cloudy sputum, very productive). Associated symptoms include headaches, nasal congestion, postnasal drip and rhinorrhea. Pertinent negatives include no chest pain, chills, ear congestion, ear pain, fever, heartburn, hemoptysis, myalgias, rash, sore throat, shortness of breath, sweats or wheezing.   Patient has been using her albuterol inhaler without any help, her coughing is severe for like she is choking with profuse postnasal drip, talking causes severe  coughing fits and at night or when trying to lay down she also cannot sleep because of frequent severe coughing.  During the telephone encounter she has difficulty speaking a full sentence because you can hear her choke and have coughing fits.  Patient was scheduled for routine follow-up but she did get ill 3 days ago we will defer her routine follow-up appointment until next week we will also check on her symptoms and improvement from her COPD exacerbation.    Patient Active Problem List   Diagnosis Date Noted   Major depression, recurrent ([place]) 01/05/2017   Abnormal grief reaction 01/05/2017   Chronic pain syndrome 03/07/2016   Vitamin D deficiency 11/10/2015   Elevated C-reactive protein (CRP) 11/10/2015   Arthralgia 10/20/2015   Lumbar facet hypertrophy (Bilateral) 10/20/2015   Lumbar facet syndrome (Location of Primary Source of Pain) (Right) 10/20/2015   Lumbar spondylosis 10/20/2015   Osteopenia 10/20/2015   Chronic shoulder pain (Right) 10/18/2015   Chronic low back pain (Location of Primary Source of Pain) (Right) 10/18/2015   Chronic lower extremity pain (Location of Secondary source of pain) (Right) 10/18/2015   Occipital neuralgia (Right) 10/18/2015   Cervicogenic headache 10/18/2015   Drug-seeking behavior 10/06/2015   Type 2 diabetes, controlled, with neuropathy ([place]) 09/30/2014   Chronic neck pain (Location of Tertiary source of pain) (Right) 09/30/2014   Cervical disc disorder with radiculopathy of cervical region 09/30/2014   Gout 08/07/2014   Hyperlipidemia 08/07/2014   Hypothyroidism 08/07/2014   Congenital scoliosis 08/07/2014   COPD (chronic obstructive pulmonary disease) ([place])    Hypertension     Past Surgical History:  Procedure Laterality Date  ABDOMINAL HYSTERECTOMY  2003   one ovary remains: due to heavy bleeding/endometrioma/cysts   BACK SURGERY     and injections   CARPAL TUNNEL RELEASE     FOOT SURGERY Right    bone  spur   POLYPECTOMY     uterine    Family History  Problem Relation Age of Onset   Alzheimer's disease Mother    Dementia Mother    Cancer Mother        colon   Heart disease Mother    Hypertension Mother    Depression Mother    Alzheimer's disease Father    Dementia Father    Hypertension Father    Cancer Brother        lung   Lung disease Brother    Hypertension Sister    Pneumonia Brother    Heart failure Brother     Social History   Socioeconomic History   Marital status: Widowed    Spouse name: Not on file   Number of children: 0   Years of education: Not on file   Highest education level: Associate degree: academic program  Occupational History   Not on file  Social Needs   Financial resource strain: Somewhat hard   Food insecurity    Worry: Never true    Inability: Never true   Transportation needs    Medical: No    Non-medical: No  Tobacco Use   Smoking status: Former Smoker    Packs/day: 2.00    Years: 5.00    Pack years: 10.00    Types: Cigarettes    Quit date: 02/02/1991    Years since quitting: 27.7   Smokeless tobacco: Never Used  Substance and Sexual Activity   Alcohol use: No    Alcohol/week: 0.0 standard drinks   Drug use: No   Sexual activity: Not Currently  Lifestyle   Physical activity    Days per week: 7 days    Minutes per session: 20 min   Stress: To some extent  Relationships   Social connections    Talks on phone: More than three times a week    Gets together: Three times a week    Attends religious service: More than 4 times per year    Active member of club or organization: No    Attends meetings of clubs or organizations: Never    Relationship status: Widowed   Intimate partner violence    Fear of current or ex partner: No    Emotionally abused: No    Physically abused: No    Forced sexual activity: No  Other Topics Concern   Not on file  Social History Narrative   Not on file      Current Outpatient Medications:    albuterol (VENTOLIN HFA) 108 (90 Base) MCG/ACT inhaler, Inhale 2 puffs into the lungs every 4 (four) hours as needed for wheezing or shortness of breath., Disp: 1 Inhaler, Rfl: 0   ALPRAZolam (XANAX) 1 MG tablet, Take 1 tablet by mouth 2 (two) times daily., Disp: , Rfl: 2   aspirin-acetaminophen-caffeine (EXCEDRIN MIGRAINE) 250-250-65 MG tablet, Take 2 tablets by mouth every 6 (six) hours as needed for headache., Disp: , Rfl:    atorvastatin (LIPITOR) 80 MG tablet, Take 1 tablet (80 mg total) by mouth at bedtime., Disp: 30 tablet, Rfl: 1   fluticasone (FLONASE) 50 MCG/ACT nasal spray, Place 2 sprays into both nostrils daily., Disp: 16 g, Rfl: 6   levothyroxine (SYNTHROID) 50 MCG tablet,  TAKE 1 TABLET BY MOUTH EVERY DAY BEFORE BREAKFAST, Disp: 7 tablet, Rfl: 0   sertraline (ZOLOFT) 100 MG tablet, Take 150 mg by mouth daily. 1.5 tablets by mouth daily, Disp: , Rfl:    valsartan (DIOVAN) 80 MG tablet, Take 1 tablet (80 mg total) by mouth daily. For blood pressure; this REPLACES losartan, Disp: 8 tablet, Rfl: 0  Allergies  Allergen Reactions   Contrast Media [Iodinated Diagnostic Agents] Shortness Of Breath   Aspirin Nausea And Vomiting   Macrobid [Nitrofurantoin Monohyd Macro] Hives and Nausea Only      Review of Systems  Constitutional: Negative.  Negative for chills and fever.  HENT: Positive for congestion, postnasal drip, rhinorrhea, sinus pain and sneezing. Negative for ear pain and sore throat.   Eyes: Negative.   Respiratory: Positive for cough. Negative for hemoptysis, shortness of breath and wheezing.   Cardiovascular: Negative.  Negative for chest pain.  Gastrointestinal: Negative.  Negative for abdominal pain, diarrhea, heartburn, nausea and vomiting.  Endocrine: Negative.   Genitourinary: Negative.  Negative for dysuria.  Musculoskeletal: Negative.  Negative for joint pain, myalgias and neck pain.  Skin: Negative.  Negative  for rash.  Allergic/Immunologic: Negative.   Neurological: Positive for headaches.  Hematological: Negative.   Psychiatric/Behavioral: Negative.   All other systems reviewed and are negative.     Objective:    Virtual encounter, vitals limited, only able to obtain the following: Today's Vitals   10/31/18 1357  BP: 130/80  Pulse: 88   There is no height or weight on file to calculate BMI. Nursing Note and Vital Signs reviewed.  Physical Exam Vitals signs and nursing note reviewed.  Pulmonary:     Comments: Frequent coughing fits difficulty speaking a full sentence, scratchy voice, no audible wheeze or stridor Neurological:     Mental Status: She is alert.     PE limited by telephone encounter  No results found for this or any previous visit (from the past 72 hour(s)).  PHQ2/9: Depression screen [facility] 2/9 10/31/2018 03/22/2018 01/14/2018 10/01/2017 10/01/2017  Decreased Interest 1 1 0 - 0  Down, Depressed, Hopeless 1 1 1  - 1  PHQ - 2 Score 2 2 1  - 1  Altered sleeping 0 1 1 - 0  Tired, decreased energy 0 1 1 - 2  Change in appetite 0 1 0 - 2  Feeling bad or failure about yourself  0 0 0 - 2  Trouble concentrating 0 1 0 - 2  Moving slowly or fidgety/restless 0 1 0 - 0  Suicidal thoughts 0 0 0 0 0  PHQ-9 Score 2 7 3  - 9  Difficult doing work/chores Somewhat difficult Somewhat difficult Not difficult at all - Somewhat difficult  Some recent data might be hidden   PHQ-2/9 Result is positive.    Fall Risk: Fall Risk  10/31/2018 03/22/2018 01/14/2018 10/01/2017 01/26/2017  Falls in the past year? 0 0 0 No No  Comment - - - - -  Number falls in past yr: 0 0 - - -  Comment - - - - -  Injury with Fall? 0 0 - - -  Comment - - - - -  Risk for fall due to : - - - - -  Follow up - Falls prevention discussed - - -     Assessment and Plan:       ICD-10-CM   1. COPD with acute exacerbation (HCC)  J44.1 ipratropium-albuterol (DUONEB) 0.5-2.5 (3) MG/3ML SOLN    predniSONE  (  DELTASONE) 20 MG tablet    Novel Coronavirus, NAA ([facility])    azithromycin (ZITHROMAX Z-PAK) 250 MG tablet    budesonide-formoterol (SYMBICORT) 160-4.5 MCG/ACT inhaler    levocetirizine (XYZAL) 5 MG tablet    promethazine-dextromethorphan (PROMETHAZINE-DM) 6.25-15 MG/5ML syrup   steroids, zpak, nebs, cough meds, change antihistamine, close f/up, red flags reviewed, pt to go get COVID test as well, no known exposure or sick contacts  2. Upper respiratory tract infection, unspecified type  J06.9 Novel Coronavirus, NAA ([facility])    levocetirizine (XYZAL) 5 MG tablet   nasal sx with congestion, sneezing, profuse discharge and post- nasal drip - viral URI?  may be severe worsening of allergies?  COVID test and close f/up   1 month refills put through for her cholesterol medicine, hypothyroid, and hypertension will do follow-up next week if she has a negative Covid test we will try to have her come into clinic to get her labs done because it has been about a year since having any lab work done  Reviewed red flags that patient needed to seek urgent evaluation including shortness of breath, fever chills sweats, chest pain.  Verbalized understanding  I discussed the assessment and treatment plan with the patient. The patient was provided an opportunity to ask questions and all were answered. The patient agreed with the plan and demonstrated an understanding of the instructions.   The patient was advised to call back or seek an in-person evaluation if the symptoms worsen or if the condition fails to improve as anticipated.  I provided 15 minutes of non-face-to-face time during this encounter.  Delsa Grana, PA-C 10/31/18 2:06 PM

## 2018-11-02 LAB — NOVEL CORONAVIRUS, NAA: SARS-CoV-2, NAA: NOT DETECTED

## 2018-11-06 ENCOUNTER — Encounter: Payer: Self-pay | Admitting: Family Medicine

## 2018-11-06 ENCOUNTER — Ambulatory Visit (INDEPENDENT_AMBULATORY_CARE_PROVIDER_SITE_OTHER): Payer: Medicare Other | Admitting: Family Medicine

## 2018-11-06 ENCOUNTER — Other Ambulatory Visit: Payer: Self-pay

## 2018-11-06 DIAGNOSIS — J329 Chronic sinusitis, unspecified: Secondary | ICD-10-CM | POA: Diagnosis not present

## 2018-11-06 DIAGNOSIS — J310 Chronic rhinitis: Secondary | ICD-10-CM | POA: Diagnosis not present

## 2018-11-06 DIAGNOSIS — Z760 Encounter for issue of repeat prescription: Secondary | ICD-10-CM

## 2018-11-06 DIAGNOSIS — J441 Chronic obstructive pulmonary disease with (acute) exacerbation: Secondary | ICD-10-CM | POA: Diagnosis not present

## 2018-11-06 MED ORDER — GUAIFENESIN ER 600 MG PO TB12: 600.0000 mg | ORAL_TABLET | Freq: Two times a day (BID) | ORAL | 0 refills | Status: AC

## 2018-11-06 MED ORDER — HYDROCODONE-HOMATROPINE 5-1.5 MG/5ML PO SYRP: 5.0000 mL | ORAL_SOLUTION | Freq: Three times a day (TID) | ORAL | 0 refills | Status: DC | PRN

## 2018-11-06 MED ORDER — VALSARTAN 80 MG PO TABS: 80.0000 mg | ORAL_TABLET | Freq: Every day | ORAL | 0 refills | Status: DC

## 2018-11-06 MED ORDER — PREDNISONE 10 MG PO TABS: ORAL_TABLET | ORAL | 0 refills | Status: DC

## 2018-11-06 MED ORDER — DOXYCYCLINE MONOHYDRATE 100 MG PO CAPS: 100.0000 mg | ORAL_CAPSULE | Freq: Two times a day (BID) | ORAL | 0 refills | Status: DC

## 2018-11-06 NOTE — Progress Notes (Signed)
Name: Deborah Blackwell   MRN: [id number]    DOB: 04/29/1960   Date:11/06/2018       Progress Note  Subjective:    Chief Complaint  Chief Complaint  Patient presents with  . URI    covid test negative, still on zpak not much better.  Syptoms include: cough, congestion, drainage and SOB    I connected with  Deborah Blackwell on 11/06/18 at 11:00 AM EDT by telephone and verified that I am speaking with the correct person using two identifiers.   I discussed the limitations, risks, security and privacy concerns of performing an evaluation and management service by telephone and the availability of in person appointments. Staff also discussed with the patient that there may be a patient responsible charge related to this service. Patient Location: home Provider Location: [facility] Additional Individuals present: none  HPI  Pt presents via telephone encounter for f/up COPD exacerbation.  She has taken 5 days of steroids and zpak, cough meds, and using inhalers and yesterday she felt much more SOB.   Still severe coughing, nasal congestion and discharge.  Her body aches are a little better.  She is able to walk around without too much distress but on the phone she is barely able to speak in short sentences.  She has a difficult time tolerating steroids, so she has taken 20 mg tablets daily and not the 40 mg tablets.  She has finished the Z-Pak.  She ran out of Mucinex.  She is taking Xyzal without much improvement with her nasal discharge.  She reports more profuse nasal discharge, postnasal drip, bleeding from her nose and tenderness and pressure to her sinuses and face.   She continues have a scratchy throat and voice.  She is doing her nebulizer treatments 2 times a day they help a little bit. She believes she is just starting to turn the corner and usually for her it takes her "a little bit longer" to get better.  Discussed her going into urgent care to be evaluated in person to be assessed but she  reassures me that she is starting to feel like it is getting a little bit better she is not going to go to urgent care today.  She will contact a friend to see if anyone can take her in case she needs to go.  She is able to monitor her heart rate on her watch she says it has been in the 70s and 80s.  She does not have any chest pain, sweats, fever, rash, confusion, near syncope, palpitations.  She did get her Covid test done it was negative  We have been pushing back her routine office visit for medicine refills because of her illness but today she states that she does need the valsartan refilled she only has a few pills left.     Patient Active Problem List   Diagnosis Date Noted  . Major depression, recurrent ([place]) 01/05/2017  . Abnormal grief reaction 01/05/2017  . Chronic pain syndrome 03/07/2016  . Vitamin D deficiency 11/10/2015  . Elevated C-reactive protein (CRP) 11/10/2015  . Arthralgia 10/20/2015  . Lumbar facet hypertrophy (Bilateral) 10/20/2015  . Lumbar facet syndrome (Location of Primary Source of Pain) (Right) 10/20/2015  . Lumbar spondylosis 10/20/2015  . Osteopenia 10/20/2015  . Chronic shoulder pain (Right) 10/18/2015  . Chronic low back pain (Location of Primary Source of Pain) (Right) 10/18/2015  . Chronic lower extremity pain (Location of Secondary source of pain) (Right)  10/18/2015  . Occipital neuralgia (Right) 10/18/2015  . Cervicogenic headache 10/18/2015  . Drug-seeking behavior 10/06/2015  . Type 2 diabetes, controlled, with neuropathy ([place]) 09/30/2014  . Chronic neck pain (Location of Tertiary source of pain) (Right) 09/30/2014  . Cervical disc disorder with radiculopathy of cervical region 09/30/2014  . Gout 08/07/2014  . Hyperlipidemia 08/07/2014  . Hypothyroidism 08/07/2014  . Congenital scoliosis 08/07/2014  . COPD (chronic obstructive pulmonary disease) ([place])   . Hypertension     Social History   Tobacco Use  . Smoking status: Former Smoker     Packs/day: 2.00    Years: 5.00    Pack years: 10.00    Types: Cigarettes    Quit date: 02/02/1991    Years since quitting: 27.7  . Smokeless tobacco: Never Used  Substance Use Topics  . Alcohol use: No    Alcohol/week: 0.0 standard drinks     Current Outpatient Medications:  .  albuterol (VENTOLIN HFA) 108 (90 Base) MCG/ACT inhaler, Inhale 2 puffs into the lungs every 4 (four) hours as needed for wheezing or shortness of breath., Disp: 1 Inhaler, Rfl: 0 .  ALPRAZolam (XANAX) 1 MG tablet, Take 1 tablet by mouth 2 (two) times daily., Disp: , Rfl: 2 .  aspirin-acetaminophen-caffeine (EXCEDRIN MIGRAINE) 250-250-65 MG tablet, Take 2 tablets by mouth every 6 (six) hours as needed for headache., Disp: , Rfl:  .  atorvastatin (LIPITOR) 80 MG tablet, Take 1 tablet (80 mg total) by mouth at bedtime., Disp: 30 tablet, Rfl: 1 .  azithromycin (ZITHROMAX Z-PAK) 250 MG tablet, Take 1 tablet (250 mg total) by mouth daily. 500mg  PO day 1, then 250mg  PO days 2 to 5, for COPD exacerbation, Disp: 6 tablet, Rfl: 0 .  budesonide-formoterol (SYMBICORT) 160-4.5 MCG/ACT inhaler, Inhale 2 puffs into the lungs 2 (two) times daily., Disp: 1 Inhaler, Rfl: 12 .  fluticasone (FLONASE) 50 MCG/ACT nasal spray, Place 2 sprays into both nostrils daily., Disp: 16 g, Rfl: 6 .  ipratropium-albuterol (DUONEB) 0.5-2.5 (3) MG/3ML SOLN, Take 3 mLs by nebulization every 4 (four) hours as needed., Disp: 360 mL, Rfl: 0 .  levocetirizine (XYZAL) 5 MG tablet, Take 1 tablet (5 mg total) by mouth every evening., Disp: 30 tablet, Rfl: 3 .  levothyroxine (SYNTHROID) 50 MCG tablet, Take 1 tablet (50 mcg total) by mouth daily before breakfast., Disp: 30 tablet, Rfl: 0 .  predniSONE (DELTASONE) 20 MG tablet, Take 2 tablets (40 mg total) by mouth daily., Disp: 10 tablet, Rfl: 0 .  promethazine-dextromethorphan (PROMETHAZINE-DM) 6.25-15 MG/5ML syrup, Take 5 mLs by mouth 4 (four) times daily as needed for cough., Disp: 118 mL, Rfl: 0 .   sertraline (ZOLOFT) 100 MG tablet, Take 150 mg by mouth daily. 1.5 tablets by mouth daily, Disp: , Rfl:  .  valsartan (DIOVAN) 80 MG tablet, Take 1 tablet (80 mg total) by mouth daily. For blood pressure; this REPLACES losartan, Disp: 30 tablet, Rfl: 0  Allergies  Allergen Reactions  . Contrast Media [Iodinated Diagnostic Agents] Shortness Of Breath  . Aspirin Nausea And Vomiting  . Macrobid [Nitrofurantoin Monohyd Macro] Hives and Nausea Only    I personally reviewed active problem list, medication list, allergies, notes from last encounter, lab results with the patient/caregiver today.  Review Of Systems: 10 Systems reviewed and are negative for acute change except as noted in the HPI. GEN- denies fatigue, fever, unintentional weight change, weakness HEENT- denies eye drainage, change in vision, sore throat, dysphagia CVS- denies chest pain, palpitations, near  syncope, PND, orthopnea RESP- denies hemoptysis, resp distress ABD- denies N/V, change in BM, abd pain GU- denies dysuria, hematuria, dribbling, incontinence, urinary frequency, urinary urgency, genital rash/irritation/swelling MSK- denies joint pain, joint swelling, muscle aches, injury Neuro- denies headache, dizziness, syncope, numbness, focal weakness, seizure activity Skin - denies skin changes such as dryness, swelling, wounds, erythema, lesions, rash   Objective:    Virtual encounter, vitals limited, only able to obtain the following There were no vitals filed for this visit. There is no height or weight on file to calculate BMI. Nursing Note and Vital Signs reviewed.  Physical Exam Vitals signs and nursing note reviewed.  Neck:     Comments: Scratchy voice Cardiovascular:     Rate and Rhythm: Normal rate.     Comments: Reported hr 80 at time of exam Pulmonary:     Comments: Frequent coughing, able to speak in very short sentences, no audible wheeze or stridor Neurological:     Mental Status: She is alert.   Psychiatric:        Attention and Perception: Attention normal.        Mood and Affect: Mood normal.        Behavior: Behavior normal.     PE limited by telephone encounter  No results found for this or any previous visit (from the past 72 hour(s)).  Assessment and Plan:     ICD-10-CM   1. COPD with acute exacerbation (HCC)  J44.1 HYDROcodone-homatropine (HYCODAN) 5-1.5 MG/5ML syrup    predniSONE (DELTASONE) 10 MG tablet    doxycycline (MONODOX) 100 MG capsule  2. Rhinosinusitis  J31.0    J32.9   3. Medication refill  Z76.0 valsartan (DIOVAN) 80 MG tablet   bp meds refilled - will still reschedule OV for her routine f/up with labs etc.    COPD exacerbation -minimal improvement after steroids and Z-Pak, nebulizers, antihistamines and cough medicines She has taken a lower dose of steroids because of intolerance she was encouraged to continue 20 mg until she runs out and then start 10 mg for 5 days and then decrease to 5 mg for the next 5 days to slowly wean her off.  I concerned that if she stops the 20 mg she will worsen even more She was encouraged to use her DuoNeb's 3 times a day for the next several days Encouraged to get more Mucinex Sent through [employer] Encouraged to continue her other allergy medications at this time Since not much improvement with Z-Pak will start doxycycline to cover both her lungs and her sinuses Covid test was negative Patient still having a very difficult time speaking in full sentences but she continues to adamantly deny any severe wheeze or exertional dyspnea, confusion, lightheadedness she feels that even though she had a bad day yesterday she is just starting to get better and she feels that it will take a little bit longer because of how she takes lower doses of steroids.  She does live alone we discussed a plan for her to reach out to friends or family to have someone to take her to urgent care if she is not improving she will need a in person  evaluation which I am unfortunately unable to do here. We discussed concerning signs and symptoms which she should call 911 for evaluation and her home or ER evaluation.  Rhinosinusitis-symptoms acutely worsening with nasal pain, sinus pain and pressure, some bloody nasal discharge and profuse postnasal drip will use doxycycline to treat for presumed bacterial sinusitis    -  Red flags and when to present for emergency care or RTC including fever >101.78F, chest pain, shortness of breath, new/worsening/un-resolving symptoms - all  reviewed with patient at time of visit. Follow up and care instructions discussed and provided in AVS. - I discussed the assessment and treatment plan with the patient. The patient was provided an opportunity to ask questions and all were answered. The patient agreed with the plan and demonstrated an understanding of the instructions.  - The patient was advised to call back or seek an in-person evaluation if the symptoms worsen or if the condition fails to improve as anticipated.  I provided 20 minutes of non-face-to-face time during this encounter.  Delsa Grana, PA-C 11/06/18 10:18 AM

## 2018-11-22 ENCOUNTER — Telehealth: Payer: Self-pay | Admitting: Family Medicine

## 2018-11-22 NOTE — Telephone Encounter (Signed)
lvm for pt to return call to schedule appt asap also informed that prescriptions have been sent to pharmacy

## 2018-11-22 NOTE — Telephone Encounter (Signed)
lvm for scheduling °

## 2018-11-28 ENCOUNTER — Telehealth: Payer: Self-pay | Admitting: Family Medicine

## 2018-11-28 DIAGNOSIS — Z760 Encounter for issue of repeat prescription: Secondary | ICD-10-CM

## 2018-11-29 NOTE — Telephone Encounter (Signed)
lvm for scheduling °

## 2018-12-25 ENCOUNTER — Encounter: Payer: Self-pay | Admitting: Family Medicine

## 2018-12-25 ENCOUNTER — Other Ambulatory Visit: Payer: Self-pay

## 2018-12-25 ENCOUNTER — Ambulatory Visit (INDEPENDENT_AMBULATORY_CARE_PROVIDER_SITE_OTHER): Payer: Medicare Other | Admitting: Family Medicine

## 2018-12-25 VITALS — BP 122/88 | HR 96 | Temp 98.3°F | Resp 14 | Ht 61.0 in | Wt 176.0 lb

## 2018-12-25 DIAGNOSIS — E1140 Type 2 diabetes mellitus with diabetic neuropathy, unspecified: Secondary | ICD-10-CM

## 2018-12-25 DIAGNOSIS — E039 Hypothyroidism, unspecified: Secondary | ICD-10-CM | POA: Diagnosis not present

## 2018-12-25 DIAGNOSIS — Z1211 Encounter for screening for malignant neoplasm of colon: Secondary | ICD-10-CM

## 2018-12-25 DIAGNOSIS — J329 Chronic sinusitis, unspecified: Secondary | ICD-10-CM

## 2018-12-25 DIAGNOSIS — J310 Chronic rhinitis: Secondary | ICD-10-CM

## 2018-12-25 DIAGNOSIS — Z760 Encounter for issue of repeat prescription: Secondary | ICD-10-CM

## 2018-12-25 DIAGNOSIS — Z5181 Encounter for therapeutic drug level monitoring: Secondary | ICD-10-CM

## 2018-12-25 DIAGNOSIS — F331 Major depressive disorder, recurrent, moderate: Secondary | ICD-10-CM

## 2018-12-25 DIAGNOSIS — E782 Mixed hyperlipidemia: Secondary | ICD-10-CM

## 2018-12-25 DIAGNOSIS — J441 Chronic obstructive pulmonary disease with (acute) exacerbation: Secondary | ICD-10-CM

## 2018-12-25 DIAGNOSIS — I10 Essential (primary) hypertension: Secondary | ICD-10-CM

## 2018-12-25 DIAGNOSIS — Z1231 Encounter for screening mammogram for malignant neoplasm of breast: Secondary | ICD-10-CM

## 2018-12-25 MED ORDER — LEVOFLOXACIN 750 MG PO TABS: 750.0000 mg | ORAL_TABLET | Freq: Every day | ORAL | 0 refills | Status: DC

## 2018-12-25 MED ORDER — VALSARTAN 80 MG PO TABS: 80.0000 mg | ORAL_TABLET | Freq: Every day | ORAL | 0 refills | Status: DC

## 2018-12-25 MED ORDER — IPRATROPIUM-ALBUTEROL 0.5-2.5 (3) MG/3ML IN SOLN: 3.0000 mL | Freq: Four times a day (QID) | RESPIRATORY_TRACT | 1 refills | Status: DC | PRN

## 2018-12-25 MED ORDER — DEXAMETHASONE SODIUM PHOSPHATE 100 MG/10ML IJ SOLN
10.0000 mg | Freq: Once | INTRAMUSCULAR | Status: AC
  Administered 2018-12-25: 10 mg via INTRAMUSCULAR

## 2018-12-25 MED ORDER — VALSARTAN 80 MG PO TABS: 80.0000 mg | ORAL_TABLET | Freq: Every day | ORAL | 1 refills | Status: DC

## 2018-12-25 MED ORDER — ATORVASTATIN CALCIUM 80 MG PO TABS: ORAL_TABLET | ORAL | 1 refills | Status: DC

## 2018-12-25 NOTE — Progress Notes (Signed)
Name: Deborah Blackwell   MRN: [id number]    DOB: 1960/03/18   Date:12/25/2018       Progress Note  Chief Complaint  Patient presents with  . Follow-up  . Diabetes  . Hypertension  . Hyperlipidemia  . Hypothyroidism     Subjective:   Deborah Blackwell is a 58 y.o. female, presents to clinic for routine follow up on the conditions listed above.  Diabetes Mellitus Type II: Currently managing with only lifestyle changes, has been well controlled Fasting CBGs typically run 118, She is checking about once a day every  Denies: Polyuria, polydipsia, polyphagia, vision changes, or neuropathy  Recent pertinent labs: Lab Results  Component Value Date   HGBA1C 5.9 (H) 10/01/2017   HGBA1C 5.7 (H) 01/02/2017   HGBA1C 5.7 01/02/2017  Current diet: in general, a "healthy" diet    A lot of salads  updating DM foot exam and eye exam ACEI/ARB: Yes Statin: Yes  Has gained a lot of weight -  Hypothyroidism: History: hypothyroidism Current Medication Regimen: 50 mcg synthroid Takes medicine first thing in the morning - takes with BP meds zoloft, waits 30 min before eating Current Symptoms: denies heat/cold intolerance, bowel/skin changes or CVS symptoms.  She is down, tired and has gained weight Most recent results are below; we will be repeating labs today. Lab Results  Component Value Date   TSH 0.62 10/01/2017  endorses gradual weight gain of 30 lbs over the past two years.  Depression worse right now - seeing a counselor Husband passed two years ago, this time of year is very hard No car, isolated by these things, and COVID shut down Depression screen [facility] 2/9 12/25/2018 10/31/2018 03/22/2018  Decreased Interest 1 1 1   Down, Depressed, Hopeless - 1 1  PHQ - 2 Score 1 2 2   Altered sleeping 1 0 1  Tired, decreased energy 0 0 1  Change in appetite 0 0 1  Feeling bad or failure about yourself  0 0 0  Trouble concentrating 0 0 1  Moving slowly or fidgety/restless 0 0 1  Suicidal thoughts 0  0 0  PHQ-9 Score 2 2 7   Difficult doing work/chores Not difficult at all Somewhat difficult Somewhat difficult  Some recent data might be hidden   Hypertension:  Currently managed on valsartan 80 mg daily Pt reports good med compliance and denies any SE.  No lightheadedness, hypotension, syncope. Blood pressure today is well controlled. BP Readings from Last 3 Encounters:  12/25/18 122/88  10/31/18 130/80  03/22/18 116/72  Pt denies CP, SOB, exertional sx, LE edema, palpitation, Ha's, visual disturbances  Hyperlipidemia: Current Medication Regimen:  lipitor 80 mg Last Lipids: Lab Results  Component Value Date   CHOL 231 (H) 01/14/2018   HDL 46 (L) 01/14/2018   LDLCALC 169 (H) 01/14/2018   TRIG 61 01/14/2018   CHOLHDL 5.0 (H) 01/14/2018  - Denies: Chest pain, shortness of breath, myalgias. Lifestyle, diet and weight not her best these past couple months.  MDD: Patient also endorses feeling down and depressed her husband died 2 years ago in 12/30/22 is also his birthday and the holidays are very hard for her with her emotions and grief and feeling very down and depressed.  She also lives in assisted nursing facility where it is controlled environment and people are not able to come in and visit due to Covid pandemic.  She is limited on her interaction because she also cannot drive and does not have transportation  to go out of the facility.  She is feeling sad and is tearful today. Depression screen [facility] 2/9 12/25/2018 10/31/2018 03/22/2018  Decreased Interest 1 1 1   Down, Depressed, Hopeless - 1 1  PHQ - 2 Score 1 2 2   Altered sleeping 1 0 1  Tired, decreased energy 0 0 1  Change in appetite 0 0 1  Feeling bad or failure about yourself  0 0 0  Trouble concentrating 0 0 1  Moving slowly or fidgety/restless 0 0 1  Suicidal thoughts 0 0 0  PHQ-9 Score 2 2 7   Difficult doing work/chores Not difficult at all Somewhat difficult Somewhat difficult  Some recent data might be hidden     Follow up on COPD exacerbation -  About 7-8 weeks ago pt presented with illness, URI/viral sx and COPD exacerbation,  COVID test was negative 11/02/2018, she was given steroids, zpak and encouraged to use nebulizers/inhaler more often.  She has continued to have sx and did not return to her baseline.  She cannot tolerate steroids very well so she took small doses of the prednisone and only had a minimal improvement.  She does have a pulmonologist but despite my asking her to she did not follow-up with them and she did not return to clinic after not improving.  She is coughing and is very congested.  She endorses profuse nasal drainage, postnasal drip, coughing and wheeze.  She did start Xyzal, Flonase, took Mucinex D and is doing an albuterol nebulizer three times a day, and she still does not feel that she is yet back to her baseline.  Patient Active Problem List   Diagnosis Date Noted  . Major depression, recurrent ([place]) 01/05/2017  . Abnormal grief reaction 01/05/2017  . Chronic pain syndrome 03/07/2016  . Vitamin D deficiency 11/10/2015  . Elevated C-reactive protein (CRP) 11/10/2015  . Arthralgia 10/20/2015  . Lumbar facet hypertrophy (Bilateral) 10/20/2015  . Lumbar facet syndrome (Location of Primary Source of Pain) (Right) 10/20/2015  . Lumbar spondylosis 10/20/2015  . Osteopenia 10/20/2015  . Chronic shoulder pain (Right) 10/18/2015  . Chronic low back pain (Location of Primary Source of Pain) (Right) 10/18/2015  . Chronic lower extremity pain (Location of Secondary source of pain) (Right) 10/18/2015  . Occipital neuralgia (Right) 10/18/2015  . Cervicogenic headache 10/18/2015  . Drug-seeking behavior 10/06/2015  . Type 2 diabetes, controlled, with neuropathy ([place]) 09/30/2014  . Chronic neck pain (Location of Tertiary source of pain) (Right) 09/30/2014  . Cervical disc disorder with radiculopathy of cervical region 09/30/2014  . Gout 08/07/2014  . Hyperlipidemia 08/07/2014  .  Hypothyroidism 08/07/2014  . Congenital scoliosis 08/07/2014  . COPD (chronic obstructive pulmonary disease) ([place])   . Hypertension     Past Surgical History:  Procedure Laterality Date  . ABDOMINAL HYSTERECTOMY  2003   one ovary remains: due to heavy bleeding/endometrioma/cysts  . BACK SURGERY     and injections  . CARPAL TUNNEL RELEASE    . FOOT SURGERY Right    bone spur  . POLYPECTOMY     uterine    Family History  Problem Relation Age of Onset  . Alzheimer's disease Mother   . Dementia Mother   . Cancer Mother        colon  . Heart disease Mother   . Hypertension Mother   . Depression Mother   . Alzheimer's disease Father   . Dementia Father   . Hypertension Father   . Cancer Brother  lung  . Lung disease Brother   . Hypertension Sister   . Pneumonia Brother   . Heart failure Brother     Social History   Socioeconomic History  . Marital status: Widowed    Spouse name: Not on file  . Number of children: 0  . Years of education: Not on file  . Highest education level: Associate degree: academic program  Occupational History  . Not on file  Social Needs  . Financial resource strain: Somewhat hard  . Food insecurity    Worry: Never true    Inability: Never true  . Transportation needs    Medical: No    Non-medical: No  Tobacco Use  . Smoking status: Former Smoker    Packs/day: 2.00    Years: 5.00    Pack years: 10.00    Types: Cigarettes    Quit date: 02/02/1991    Years since quitting: 27.9  . Smokeless tobacco: Never Used  Substance and Sexual Activity  . Alcohol use: No    Alcohol/week: 0.0 standard drinks  . Drug use: No  . Sexual activity: Not Currently  Lifestyle  . Physical activity    Days per week: 7 days    Minutes per session: 20 min  . Stress: To some extent  Relationships  . Social connections    Talks on phone: More than three times a week    Gets together: Three times a week    Attends religious service: More than 4  times per year    Active member of club or organization: No    Attends meetings of clubs or organizations: Never    Relationship status: Widowed  . Intimate partner violence    Fear of current or ex partner: No    Emotionally abused: No    Physically abused: No    Forced sexual activity: No  Other Topics Concern  . Not on file  Social History Narrative  . Not on file     Current Outpatient Medications:  .  albuterol (VENTOLIN HFA) 108 (90 Base) MCG/ACT inhaler, Inhale 2 puffs into the lungs every 4 (four) hours as needed for wheezing or shortness of breath., Disp: 1 Inhaler, Rfl: 0 .  ALPRAZolam (XANAX) 1 MG tablet, Take 1 tablet by mouth 2 (two) times daily., Disp: , Rfl: 2 .  atorvastatin (LIPITOR) 80 MG tablet, TAKE 1 TABLET BY MOUTH EVERYDAY AT BEDTIME, Disp: 30 tablet, Rfl: 1 .  budesonide-formoterol (SYMBICORT) 160-4.5 MCG/ACT inhaler, Inhale 2 puffs into the lungs 2 (two) times daily., Disp: 1 Inhaler, Rfl: 12 .  fluticasone (FLONASE) 50 MCG/ACT nasal spray, Place 2 sprays into both nostrils daily., Disp: 16 g, Rfl: 6 .  ipratropium-albuterol (DUONEB) 0.5-2.5 (3) MG/3ML SOLN, Take 3 mLs by nebulization every 4 (four) hours as needed., Disp: 360 mL, Rfl: 0 .  levocetirizine (XYZAL) 5 MG tablet, Take 1 tablet (5 mg total) by mouth every evening., Disp: 30 tablet, Rfl: 3 .  levothyroxine (SYNTHROID) 50 MCG tablet, TAKE 1 TABLET BY MOUTH DAILY BEFORE BREAKFAST, Disp: 30 tablet, Rfl: 0 .  sertraline (ZOLOFT) 100 MG tablet, Take 150 mg by mouth daily. 1.5 tablets by mouth daily, Disp: , Rfl:  .  valsartan (DIOVAN) 80 MG tablet, Take 1 tablet (80 mg total) by mouth daily. For blood pressure; this REPLACES losartan, Disp: 30 tablet, Rfl: 0  Allergies  Allergen Reactions  . Contrast Media [Iodinated Diagnostic Agents] Shortness Of Breath  . Aspirin Nausea And Vomiting  .  Macrobid [Nitrofurantoin Monohyd Macro] Hives and Nausea Only   I personally reviewed active problem list,  medication list, allergies, family history, social history, health maintenance, notes from last encounter, lab results, imaging with the patient/caregiver today.   Review of Systems  Constitutional: Positive for fatigue. Negative for activity change, appetite change, chills, diaphoresis and fever.  HENT: Negative.   Eyes: Negative.   Respiratory: Positive for cough, chest tightness, shortness of breath and wheezing.   Cardiovascular: Negative.  Negative for chest pain.  Gastrointestinal: Negative.   Endocrine: Negative.   Genitourinary: Negative.   Musculoskeletal: Negative.   Skin: Negative.   Allergic/Immunologic: Negative.   Neurological: Negative.   Hematological: Negative.   Psychiatric/Behavioral: Negative.   All other systems reviewed and are negative.    Objective:    Vitals:   12/25/18 1117  BP: 122/88  Pulse: 96  Resp: 14  Temp: 98.3 F (36.8 C)  SpO2: 93%  Weight: 176 lb (79.8 kg)  Height: 5\' 1"  (1.549 m)    Body mass index is 33.25 kg/m.  Physical Exam Vitals signs and nursing note reviewed.  Constitutional:      General: She is not in acute distress.    Appearance: Normal appearance. She is well-developed. She is obese. She is not ill-appearing, toxic-appearing or diaphoretic.  HENT:     Head: Normocephalic and atraumatic.     Right Ear: Hearing, tympanic membrane, ear canal and external ear normal.     Left Ear: Hearing, tympanic membrane, ear canal and external ear normal.     Nose: Mucosal edema, congestion and rhinorrhea present. Rhinorrhea is purulent and bloody.     Comments: Profuse nasal discharge Nasal mucosa severely edematous and congested    Mouth/Throat:     Mouth: Mucous membranes are moist. Mucous membranes are not pale.     Pharynx: Uvula midline. No oropharyngeal exudate or uvula swelling.     Tonsils: No tonsillar abscesses.  Eyes:     General: Lids are normal.        Right eye: No discharge.        Left eye: No discharge.      Conjunctiva/sclera: Conjunctivae normal.     Pupils: Pupils are equal, round, and reactive to light.  Neck:     Musculoskeletal: Normal range of motion and neck supple.     Trachea: Phonation normal. No tracheal deviation.  Cardiovascular:     Rate and Rhythm: Normal rate and regular rhythm.     Pulses: Normal pulses.          Radial pulses are 2+ on the right side and 2+ on the left side.       Posterior tibial pulses are 2+ on the right side and 2+ on the left side.     Heart sounds: Normal heart sounds. No murmur. No friction rub. No gallop.   Pulmonary:     Effort: Pulmonary effort is normal. No tachypnea, accessory muscle usage, prolonged expiration, respiratory distress or retractions.     Breath sounds: No stridor or transmitted upper airway sounds. Wheezing and rhonchi present. No rales.     Comments: Frequent coughing able to speak in short sentences, no respiratory distress Chest:     Chest [place]: No tenderness.  Abdominal:     General: Bowel sounds are normal. There is no distension.     Palpations: Abdomen is soft.     Tenderness: There is no abdominal tenderness. There is no guarding or rebound.  Musculoskeletal: Normal range  of motion.        General: No deformity.  Lymphadenopathy:     Cervical: No cervical adenopathy.  Skin:    General: Skin is warm and dry.     Capillary Refill: Capillary refill takes less than 2 seconds.     Coloration: Skin is not pale.     Findings: No rash.  Neurological:     Mental Status: She is alert and oriented to person, place, and time.     Motor: No abnormal muscle tone.     Coordination: Coordination normal.     Gait: Gait normal.  Psychiatric:        Speech: Speech normal.        Behavior: Behavior normal.      Recent Results (from the past 2160 hour(s))  Novel Coronavirus, NAA ([facility])     Status: None   Collection Time: 10/31/18  3:06 PM   Specimen: Nasopharyngeal(NP) swabs in vial transport medium   NASOPHARYNGE  TESTING   Result Value Ref Range   SARS-CoV-2, NAA Not Detected Not Detected    Comment: This nucleic acid amplification test was developed and its performance characteristics determined by [employer]. Nucleic acid amplification tests include PCR and TMA. This test has not been FDA cleared or approved. This test has been authorized by FDA under an Emergency Use Authorization (EUA). This test is only authorized for the duration of time the declaration that circumstances exist justifying the authorization of the emergency use of in vitro diagnostic tests for detection of SARS-CoV-2 virus and/or diagnosis of COVID-19 infection under section 564(b)(1) of the Act, 21 U.S.C. [id number]) (1), unless the authorization is terminated or revoked sooner. When diagnostic testing is negative, the possibility of a false negative result should be considered in the context of a patient's recent exposures and the presence of clinical signs and symptoms consistent with COVID-19. An individual without symptoms of COVID-19 and who is not shedding SARS-CoV-2 virus would  expect to have a negative (not detected) result in this assay.      PHQ2/9: Depression screen [facility] 2/9 12/25/2018 10/31/2018 03/22/2018 01/14/2018 10/01/2017  Decreased Interest 1 1 1  0 -  Down, Depressed, Hopeless - 1 1 1  -  PHQ - 2 Score 1 2 2 1  -  Altered sleeping 1 0 1 1 -  Tired, decreased energy 0 0 1 1 -  Change in appetite 0 0 1 0 -  Feeling bad or failure about yourself  0 0 0 0 -  Trouble concentrating 0 0 1 0 -  Moving slowly or fidgety/restless 0 0 1 0 -  Suicidal thoughts 0 0 0 0 0  PHQ-9 Score 2 2 7 3  -  Difficult doing work/chores Not difficult at all Somewhat difficult Somewhat difficult Not difficult at all -  Some recent data might be hidden    phq 9 is negative, less than 4, but pt tearful  Fall Risk: Fall Risk  12/25/2018 10/31/2018 03/22/2018 01/14/2018 10/01/2017  Falls in the past year? 0 0 0 0 No  Comment - - -  - -  Number falls in past yr: 0 0 0 - -  Comment - - - - -  Injury with Fall? 0 0 0 - -  Comment - - - - -  Risk for fall due to : - - - - -  Follow up - - Falls prevention discussed - -    Functional Status Survey: Is the patient deaf or have difficulty hearing?:  No Does the patient have difficulty seeing, even when wearing glasses/contacts?: No Does the patient have difficulty concentrating, remembering, or making decisions?: No Does the patient have difficulty walking or climbing stairs?: No Does the patient have difficulty dressing or bathing?: No Does the patient have difficulty doing errands alone such as visiting a doctor's office or shopping?: No   Assessment & Plan:   1. Type 2 diabetes, controlled, with neuropathy ([place]) Foot exam done - due for eye exam, managed with diet and lifestyle, well controlled - Ambulatory referral to Ophthalmology - CMP w GFR - A1C  2. Rhinosinusitis Severe - on going for more than a month, with COPD exacerbation and severe coughing, previously had abx, feel stronger abx are indicated, discussed medication/black box warning she has taken before and tolerated - levofloxacin (LEVAQUIN) 750 MG tablet; Take 1 tablet (750 mg total) by mouth daily.  Dispense: 7 tablet; Refill: 0  3. COPD with acute exacerbation ([place]) Steroid injection here since she cannot tolerate PO prednisone and it was already ineffective -patient was again strongly encouraged to follow-up here or with her pulmonologist if not significantly improving in the next 1 to 2 weeks.  DuoNeb vials were also prescribed for her to use 2-3 times a day as needed in place of albuterol nebs - DG Chest 2 View; Future - levofloxacin (LEVAQUIN) 750 MG tablet; Take 1 tablet (750 mg total) by mouth daily.  Dispense: 7 tablet; Refill: 0 - dexamethasone (DECADRON) injection 10 mg  4. Hypothyroidism, unspecified type Recheck - she endorses some weight gain and depressed mood, with her MDD it is  unclear if this is a depression symptom or may be related to chemical hypothyroid (dose increase?) - TSH  5. Mixed hyperlipidemia Compliant with meds, no SE, no myalgias, fatigue or jaundice - CMP w GFR - Lipid Panel - atorvastatin (LIPITOR) 80 MG tablet; TAKE 1 TABLET BY MOUTH EVERYDAY AT BEDTIME  Dispense: 90 tablet; Refill: 1  6. Moderate episode of recurrent major depressive disorder ([place]) She is on medications and does see a specialist in additionally does therapy, she was tearful today, understandably upset with her isolation the not too long ago loss of her husband this time a year is difficult for her.  7. Screen for colon cancer - Ambulatory referral to Gastroenterology  8. Encounter for screening mammogram for breast cancer - MM 3D SCREEN BREAST BILATERAL; Future  9. Essential hypertension Well controlled - CMP w GFR - valsartan (DIOVAN) 80 MG tablet; Take 1 tablet (80 mg total) by mouth daily.   10. Encounter for medication monitoring  - CBC w/ Diff - CMP w GFR - Lipid Panel - A1C - TSH      Delsa Grana, PA-C 12/25/18 11:41 AM

## 2018-12-26 LAB — CBC WITH DIFFERENTIAL/PLATELET
Absolute Monocytes: 531 cells/uL (ref 200–950)
Basophils Absolute: 39 cells/uL (ref 0–200)
Basophils Relative: 0.5 %
Eosinophils Absolute: 285 cells/uL (ref 15–500)
Eosinophils Relative: 3.7 %
HCT: 41.9 % (ref 35.0–45.0)
Hemoglobin: 14 g/dL (ref 11.7–15.5)
Lymphs Abs: 1786 cells/uL (ref 850–3900)
MCH: 29 pg (ref 27.0–33.0)
MCHC: 33.4 g/dL (ref 32.0–36.0)
MCV: 86.9 fL (ref 80.0–100.0)
MPV: 9.6 fL (ref 7.5–12.5)
Monocytes Relative: 6.9 %
Neutro Abs: 5059 cells/uL (ref 1500–7800)
Neutrophils Relative %: 65.7 %
Platelets: 300 10*3/uL (ref 140–400)
RBC: 4.82 10*6/uL (ref 3.80–5.10)
RDW: 13.1 % (ref 11.0–15.0)
Total Lymphocyte: 23.2 %
WBC: 7.7 10*3/uL (ref 3.8–10.8)

## 2018-12-26 LAB — COMPLETE METABOLIC PANEL WITH GFR
AG Ratio: 1.5 (calc) (ref 1.0–2.5)
ALT: 17 U/L (ref 6–29)
AST: 17 U/L (ref 10–35)
Albumin: 4.2 g/dL (ref 3.6–5.1)
Alkaline phosphatase (APISO): 76 U/L (ref 37–153)
BUN: 15 mg/dL (ref 7–25)
CO2: 29 mmol/L (ref 20–32)
Calcium: 9.2 mg/dL (ref 8.6–10.4)
Chloride: 105 mmol/L (ref 98–110)
Creat: 0.86 mg/dL (ref 0.50–0.99)
GFR, Est African American: 85 mL/min/{1.73_m2} (ref >=60)
GFR, Est Non African American: 73 mL/min/{1.73_m2} (ref >=60)
Globulin: 2.8 g/dL (calc) (ref 1.9–3.7)
Glucose, Bld: 110 mg/dL — ABNORMAL HIGH (ref 65–99)
Potassium: 4.2 mmol/L (ref 3.5–5.3)
Sodium: 140 mmol/L (ref 135–146)
Total Bilirubin: 0.5 mg/dL (ref 0.2–1.2)
Total Protein: 7 g/dL (ref 6.1–8.1)

## 2018-12-26 LAB — HEMOGLOBIN A1C
Hgb A1c MFr Bld: 5.7 % of total Hgb — ABNORMAL HIGH (ref <5.7)
Mean Plasma Glucose: 117 (calc)
eAG (mmol/L): 6.5 (calc)

## 2018-12-26 LAB — LIPID PANEL
Cholesterol: 162 mg/dL (ref <200)
HDL: 47 mg/dL — ABNORMAL LOW (ref >=50)
LDL Cholesterol (Calc): 100 mg/dL (calc) — ABNORMAL HIGH
Non-HDL Cholesterol (Calc): 115 mg/dL (calc) (ref <130)
Total CHOL/HDL Ratio: 3.4 (calc) (ref <5.0)
Triglycerides: 67 mg/dL (ref <150)

## 2018-12-26 LAB — TSH: TSH: 1.24 mIU/L (ref 0.40–4.50)

## 2018-12-30 ENCOUNTER — Other Ambulatory Visit: Payer: Self-pay

## 2018-12-30 MED ORDER — LEVOTHYROXINE SODIUM 50 MCG PO TABS: ORAL_TABLET | ORAL | 1 refills | Status: DC

## 2018-12-31 ENCOUNTER — Encounter: Payer: Self-pay | Admitting: *Deleted

## 2019-01-07 ENCOUNTER — Other Ambulatory Visit: Payer: Self-pay | Admitting: Family Medicine

## 2019-01-07 DIAGNOSIS — J069 Acute upper respiratory infection, unspecified: Secondary | ICD-10-CM

## 2019-01-07 DIAGNOSIS — J441 Chronic obstructive pulmonary disease with (acute) exacerbation: Secondary | ICD-10-CM

## 2019-02-27 ENCOUNTER — Ambulatory Visit: Payer: Medicare Other | Admitting: Family Medicine

## 2019-03-27 ENCOUNTER — Ambulatory Visit (INDEPENDENT_AMBULATORY_CARE_PROVIDER_SITE_OTHER): Payer: Medicare Other

## 2019-03-27 ENCOUNTER — Ambulatory Visit: Payer: Medicare Other | Admitting: Family Medicine

## 2019-03-27 VITALS — BP 126/88 | Ht 61.0 in | Wt 172.0 lb

## 2019-03-27 DIAGNOSIS — Z0000 Encounter for general adult medical examination without abnormal findings: Secondary | ICD-10-CM | POA: Diagnosis not present

## 2019-03-27 DIAGNOSIS — Z1211 Encounter for screening for malignant neoplasm of colon: Secondary | ICD-10-CM

## 2019-03-27 NOTE — Progress Notes (Signed)
Subjective:   Deborah Blackwell is a 59 y.o. female who presents for Medicare Annual (Subsequent) preventive examination.  Virtual Visit via Telephone Note  I connected with Deborah Blackwell on 03/27/19 at 10:40 AM EST by telephone and verified that I am speaking with the correct person using two identifiers.  Medicare Annual Wellness visit completed telephonically due to Covid-19 pandemic.   Location: Patient: home Provider: office   I discussed the limitations, risks, security and privacy concerns of performing an evaluation and management service by telephone and the availability of in person appointments. The patient expressed understanding and agreed to proceed.  Some vital signs may be absent or patient reported.   Clemetine Marker, LPN    Review of Systems:   Cardiac Risk Factors include: advanced age (>37men, >11 women);diabetes mellitus;hypertension;dyslipidemia;obesity (BMI >30kg/m2)     Objective:     Vitals: BP 126/88   Ht 5\' 1"  (1.549 m)   Wt 172 lb (78 kg)   BMI 32.50 kg/m   Body mass index is 32.5 kg/m.  Advanced Directives 03/27/2019 03/22/2018 05/02/2016 10/18/2015 09/30/2015 07/16/2015 06/18/2015  Does Patient Have a Medical Advance Directive? No No No No No No No  Would patient like information on creating a medical advance directive? No - Patient declined No - Patient declined - No - patient declined information No - patient declined information - -  Some encounter information is confidential and restricted. Go to Review Flowsheets activity to see all data.    Tobacco Social History   Tobacco Use  Smoking Status Former Smoker  . Packs/day: 2.00  . Years: 5.00  . Pack years: 10.00  . Types: Cigarettes  . Quit date: 02/02/1991  . Years since quitting: 28.1  Smokeless Tobacco Never Used     Counseling given: Not Answered   Clinical Intake:  Pre-visit preparation completed: Yes  Pain : No/denies pain     BMI - recorded: 32.5 Nutritional Status: BMI >  30  Obese Nutritional Risks: None Diabetes: Yes CBG done?: No Did pt. bring in CBG monitor from home?: No   Nutrition Risk Assessment:  Has the patient had any N/V/D within the last 2 months?  No  Does the patient have any non-healing wounds?  No  Has the patient had any unintentional weight loss or weight gain?  No   Diabetes:  Is the patient diabetic?  Yes  If diabetic, was a CBG obtained today?  No  Did the patient bring in their glucometer from home?  No  How often do you monitor your CBG's? Pt does not actively check blood sugar.   Financial Strains and Diabetes Management:  Are you having any financial strains with the device, your supplies or your medication? No .  Does the patient want to be seen by Chronic Care Management for management of their diabetes?  No  Would the patient like to be referred to a Nutritionist or for Diabetic Management?  No   Diabetic Exams:  Diabetic Eye Exam: Completed 10/02/17. Overdue for diabetic eye exam. Pt states she has an appt with Dr. Gloriann Loan in July.   Diabetic Foot Exam: Completed 12/25/18.    How often do you need to have someone help you when you read instructions, pamphlets, or other written materials from your doctor or pharmacy?: 1 - Never  Interpreter Needed?: No  Information entered by :: Clemetine Marker LPN  Past Medical History:  Diagnosis Date  . Angina at rest [facility])   . Arthritis   .  Asthma   . Back pain with right-sided sciatica 09/30/2014  . Bipolar disorder ([place])   . Congenital scoliosis   . COPD (chronic obstructive pulmonary disease) ([place])   . DDD (degenerative disc disease), lumbar   . Depression   . Diabetes mellitus without complication ([place])   . GERD (gastroesophageal reflux disease)   . Gout   . History of kidney stones   . History of scoliosis    was casted as a child  . Hyperlipidemia   . Hypertension   . Lumbar radiculopathy 09/30/2014  . Migraines   . Panic attack   . RA (rheumatoid arthritis) ([place])     Past Surgical History:  Procedure Laterality Date  . ABDOMINAL HYSTERECTOMY  2003   one ovary remains: due to heavy bleeding/endometrioma/cysts  . BACK SURGERY     and injections  . CARPAL TUNNEL RELEASE    . FOOT SURGERY Right    bone spur  . POLYPECTOMY     uterine   Family History  Problem Relation Age of Onset  . Alzheimer's disease Mother   . Dementia Mother   . Cancer Mother        colon  . Heart disease Mother   . Hypertension Mother   . Depression Mother   . Alzheimer's disease Father   . Dementia Father   . Hypertension Father   . Cancer Brother        lung  . Lung disease Brother   . Hypertension Sister   . Pneumonia Brother   . Heart failure Brother    Social History   Socioeconomic History  . Marital status: Widowed    Spouse name: Not on file  . Number of children: 0  . Years of education: Not on file  . Highest education level: Associate degree: academic program  Occupational History  . Not on file  Tobacco Use  . Smoking status: Former Smoker    Packs/day: 2.00    Years: 5.00    Pack years: 10.00    Types: Cigarettes    Quit date: 02/02/1991    Years since quitting: 28.1  . Smokeless tobacco: Never Used  Substance and Sexual Activity  . Alcohol use: No    Alcohol/week: 0.0 standard drinks  . Drug use: No  . Sexual activity: Not Currently  Other Topics Concern  . Not on file  Social History Narrative  . Not on file   Social Determinants of Health   Financial Resource Strain: Medium Risk  . Difficulty of Paying Living Expenses: Somewhat hard  Food Insecurity: No Food Insecurity  . Worried About Charity fundraiser in the Last Year: Never true  . Ran Out of Food in the Last Year: Never true  Transportation Needs: Unmet Transportation Needs  . Lack of Transportation (Medical): Yes  . Lack of Transportation (Non-Medical): No  Physical Activity: Insufficiently Active  . Days of Exercise per Week: 7 days  . Minutes of Exercise per  Session: 20 min  Stress: Stress Concern Present  . Feeling of Stress : To some extent  Social Connections: Somewhat Isolated  . Frequency of Communication with Friends and Family: More than three times a week  . Frequency of Social Gatherings with Friends and Family: Three times a week  . Attends Religious Services: More than 4 times per year  . Active Member of Clubs or Organizations: No  . Attends Archivist Meetings: Never  . Marital Status: Widowed    Outpatient Encounter  Medications as of 03/27/2019  Medication Sig  . albuterol (VENTOLIN HFA) 108 (90 Base) MCG/ACT inhaler Inhale 2 puffs into the lungs every 4 (four) hours as needed for wheezing or shortness of breath.  . ALPRAZolam (XANAX) 1 MG tablet Take 1 tablet by mouth 2 (two) times daily.  Marland Kitchen atorvastatin (LIPITOR) 80 MG tablet TAKE 1 TABLET BY MOUTH EVERYDAY AT BEDTIME  . budesonide-formoterol (SYMBICORT) 160-4.5 MCG/ACT inhaler Inhale 2 puffs into the lungs 2 (two) times daily.  . citalopram (CELEXA) 20 MG tablet Take 20 mg by mouth daily.  . fluticasone (FLONASE) 50 MCG/ACT nasal spray Place 2 sprays into both nostrils daily.  Marland Kitchen ipratropium-albuterol (DUONEB) 0.5-2.5 (3) MG/3ML SOLN Take 3 mLs by nebulization every 6 (six) hours as needed (for coughing fits, wheeze, SOB).  Marland Kitchen levocetirizine (XYZAL) 5 MG tablet TAKE 1 TABLET BY MOUTH EVERY DAY IN THE EVENING  . levothyroxine (SYNTHROID) 50 MCG tablet TAKE 1 TABLET BY MOUTH DAILY BEFORE BREAKFAST  . sertraline (ZOLOFT) 100 MG tablet Take 100 mg by mouth daily.   . traZODone (DESYREL) 100 MG tablet Take 50-100 mg by mouth at bedtime as needed.  . valsartan (DIOVAN) 80 MG tablet Take 1 tablet (80 mg total) by mouth daily. For blood pressure  . [DISCONTINUED] ipratropium-albuterol (DUONEB) 0.5-2.5 (3) MG/3ML SOLN Take 3 mLs by nebulization every 4 (four) hours as needed.  . [DISCONTINUED] levofloxacin (LEVAQUIN) 750 MG tablet Take 1 tablet (750 mg total) by mouth daily.    No facility-administered encounter medications on file as of 03/27/2019.    Activities of Daily Living In your present state of health, do you have any difficulty performing the following activities: 03/27/2019 12/25/2018  Hearing? N N  Comment declines hearing aids -  Vision? N N  Difficulty concentrating or making decisions? Y N  Walking or climbing stairs? N N  Dressing or bathing? N N  Doing errands, shopping? N N  Preparing Food and eating ? N -  Using the Toilet? N -  In the past six months, have you accidently leaked urine? Y -  Comment wears pads for protection -  Do you have problems with loss of bowel control? N -  Managing your Medications? N -  Managing your Finances? N -  Housekeeping or managing your Housekeeping? N -  Some recent data might be hidden    Patient Care Team: Delsa Grana, PA-C as PCP - General (Family Medicine) Lance Bosch, MD as Referring Physician (Pain Medicine) Marjie Skiff, MD as Consulting Physician (Psychiatry)    Assessment:   This is a routine wellness examination for Deborah Blackwell.  Exercise Activities and Dietary recommendations Current Exercise Habits: Home exercise routine, Type of exercise: walking, Time (Minutes): 20, Frequency (Times/Week): 7, Weekly Exercise (Minutes/Week): 140, Intensity: Mild, Exercise limited by: None identified  Goals   None     Fall Risk Fall Risk  03/27/2019 12/25/2018 10/31/2018 03/22/2018 01/14/2018  Falls in the past year? 0 0 0 0 0  Comment - - - - -  Number falls in past yr: 0 0 0 0 -  Comment - - - - -  Injury with Fall? 0 0 0 0 -  Comment - - - - -  Risk for fall due to : No Fall Risks - - - -  Follow up Falls prevention discussed - - Falls prevention discussed -   FALL RISK PREVENTION PERTAINING TO THE HOME:  Any stairs in or around the home? No If so, do they  handrails? No   Home free of loose throw rugs in walkways, pet beds, electrical cords, etc? Yes  Adequate lighting in your home  to reduce risk of falls? Yes   ASSISTIVE DEVICES UTILIZED TO PREVENT FALLS:  Life alert? No  Use of a cane, walker or w/c? No  Grab bars in the bathroom? Yes  Shower chair or bench in shower? Yes  Elevated toilet seat or a handicapped toilet? No   DME ORDERS:  DME order needed?  No   TIMED UP AND GO:  Was the test performed? No . Telephonic visit.   Education: Fall risk prevention has been discussed.  Intervention(s) required? No    Depression Screen PHQ 2/9 Scores 03/27/2019 12/25/2018 10/31/2018 03/22/2018  PHQ - 2 Score 2 1 2 2   PHQ- 9 Score 6 2 2 7   Exception Documentation - - - -     Cognitive Function     6CIT Screen 03/27/2019 03/22/2018  What Year? 0 points 0 points  What month? 0 points 0 points  What time? 0 points 0 points  Count back from 20 0 points 0 points  Months in reverse 0 points 0 points  Repeat phrase 0 points 0 points  Total Score 0 0    Immunization History  Administered Date(s) Administered  . Influenza,inj,Quad PF,6+ Mos 09/30/2015, 10/01/2017, 10/27/2018  . Influenza-Unspecified 09/17/2014, 10/11/2016, 10/24/2018  . Pneumococcal Conjugate-13 05/07/2013  . Pneumococcal Polysaccharide-23 09/13/2011  . Tdap 11/10/2009    Qualifies for Shingles Vaccine? Yes . Due for Shingrix. Education has been provided regarding the importance of this vaccine. Pt has been advised to call insurance company to determine out of pocket expense. Advised may also receive vaccine at local pharmacy or Health Dept. Verbalized acceptance and understanding.  Tdap: Up to date  Flu Vaccine: Up to date  Pneumococcal Vaccine: Up to date   Screening Tests Health Maintenance  Topic Date Due  . Hepatitis C Screening  Never done  . HIV Screening  Never done  . MAMMOGRAM  01/16/2013  . COLONOSCOPY  01/16/2017  . OPHTHALMOLOGY EXAM  10/03/2018  . HEMOGLOBIN A1C  06/25/2019  . TETANUS/TDAP  11/11/2019  . FOOT EXAM  12/25/2019  . INFLUENZA VACCINE  Completed  .  PNEUMOCOCCAL POLYSACCHARIDE VACCINE AGE 85-64 HIGH RISK  Completed  . PAP SMEAR-Modifier  Discontinued    Cancer Screenings:  Colorectal Screening: Completed 2014. Repeat every 5 years. Referral to GI placed today. Pt aware the office will call re: appt.  Mammogram: Completed 2014. Repeat every year. Ordered 12/25/18. Pt provided with contact information and advised to call to schedule appt.   Bone Density: due at age 15  Lung Cancer Screening: (Low Dose CT Chest recommended if Age 41-80 years, 30 pack-year currently smoking OR have quit w/in 15years.) does not qualify.   Additional Screening:  Hepatitis C Screening: does qualify; due  Vision Screening: Recommended annual ophthalmology exams for early detection of glaucoma and other disorders of the eye. Is the patient up to date with their annual eye exam?  No  Who is the provider or what is the name of the office in which the pt attends annual eye exams? Dr. Gloriann Loan  Dental Screening: Recommended annual dental exams for proper oral hygiene  Community Resource Referral:  CRR required this visit?  No      Plan:     I have personally reviewed and addressed the Medicare Annual Wellness questionnaire and have noted the following in the patient's chart:  A. Medical and social history B. Use of alcohol, tobacco or illicit drugs  C. Current medications and supplements D. Functional ability and status E.  Nutritional status F.  Physical activity G. Advance directives H. List of other physicians I.  Hospitalizations, surgeries, and ER visits in previous 12 months J.  [place] such as hearing and vision if needed, cognitive and depression L. Referrals and appointments   In addition, I have reviewed and discussed with patient certain preventive protocols, quality metrics, and best practice recommendations. A written personalized care plan for preventive services as well as general preventive health recommendations were  provided to patient.   Signed,  Clemetine Marker, LPN Nurse Health Advisor    Nurse Notes: PHQ9 = 6 today. Pt states she still has a lot of anxiety related to her husband passing away in 2019, states she has done grief counseling in the past. Pt is also scheduled for virtual visit with Delsa Grana [facility] today as well.

## 2019-03-27 NOTE — Patient Instructions (Signed)
Deborah Blackwell , Thank you for taking time to come for your Medicare Wellness Visit. I appreciate your ongoing commitment to your health goals. Please review the following plan we discussed and let me know if I can assist you in the future.   Screening recommendations/referrals: Colonoscopy: due - referral sent to [facility] gastroenterology today for screening colonoscopy.  Mammogram: Please call [phone] to schedule your mammogram.  Recommended yearly ophthalmology/optometry visit for glaucoma screening and checkup Recommended yearly dental visit for hygiene and checkup  Vaccinations: Influenza vaccine: done 10/27/18 Pneumococcal vaccine: done 05/07/13 Tdap vaccine: done 11/12/09 Shingles vaccine: Shingrix discussed. Please contact your pharmacy for coverage information.   Advanced directives: Advance directive discussed with you today. Even though you declined this today please call our office should you change your mind and we can give you the proper paperwork for you to fill out.  Conditions/risks identified: Recommend drinking 6-8 glasses of water per day.   Next appointment: Please follow up in one year for your Medicare Annual Wellness visit.     Preventive Care 25 Years and Older, Female Preventive care refers to lifestyle choices and visits with your health care provider that can promote health and wellness. What does preventive care include?  A yearly physical exam. This is also called an annual well check.  Dental exams once or twice a year.  Routine eye exams. Ask your health care provider how often you should have your eyes checked.  Personal lifestyle choices, including:  Daily care of your teeth and gums.  Regular physical activity.  Eating a healthy diet.  Avoiding tobacco and drug use.  Limiting alcohol use.  Practicing safe sex.  Taking low-dose aspirin every day.  Taking vitamin and mineral supplements as recommended by your health care provider. What  happens during an annual well check? The services and screenings done by your health care provider during your annual well check will depend on your age, overall health, lifestyle risk factors, and family history of disease. Counseling  Your health care provider may ask you questions about your:  Alcohol use.  Tobacco use.  Drug use.  Emotional well-being.  Home and relationship well-being.  Sexual activity.  Eating habits.  History of falls.  Memory and ability to understand (cognition).  Work and work Statistician.  Reproductive health. Screening  You may have the following tests or measurements:  Height, weight, and BMI.  Blood pressure.  Lipid and cholesterol levels. These may be checked every 5 years, or more frequently if you are over 38 years old.  Skin check.  Lung cancer screening. You may have this screening every year starting at age 86 if you have a 30-pack-year history of smoking and currently smoke or have quit within the past 15 years.  Fecal occult blood test (FOBT) of the stool. You may have this test every year starting at age 77.  Flexible sigmoidoscopy or colonoscopy. You may have a sigmoidoscopy every 5 years or a colonoscopy every 10 years starting at age 57.  Hepatitis C blood test.  Hepatitis B blood test.  Sexually transmitted disease (STD) testing.  Diabetes screening. This is done by checking your blood sugar (glucose) after you have not eaten for a while (fasting). You may have this done every 1-3 years.  Bone density scan. This is done to screen for osteoporosis. You may have this done starting at age 35.  Mammogram. This may be done every 1-2 years. Talk to your health care provider about how often you  should have regular mammograms. Talk with your health care provider about your test results, treatment options, and if necessary, the need for more tests. Vaccines  Your health care provider may recommend certain vaccines, such  as:  Influenza vaccine. This is recommended every year.  Tetanus, diphtheria, and acellular pertussis (Tdap, Td) vaccine. You may need a Td booster every 10 years.  Zoster vaccine. You may need this after age 38.  Pneumococcal 13-valent conjugate (PCV13) vaccine. One dose is recommended after age 2.  Pneumococcal polysaccharide (PPSV23) vaccine. One dose is recommended after age 63. Talk to your health care provider about which screenings and vaccines you need and how often you need them. This information is not intended to replace advice given to you by your health care provider. Make sure you discuss any questions you have with your health care provider. Document Released: 01/29/2015 Document Revised: 09/22/2015 Document Reviewed: 11/03/2014 Elsevier Interactive Patient Education  2017 [employer] Prevention in the Home Falls can cause injuries. They can happen to people of all ages. There are many things you can do to make your home safe and to help prevent falls. What can I do on the outside of my home?  Regularly fix the edges of walkways and driveways and fix any cracks.  Remove anything that might make you trip as you walk through a door, such as a raised step or threshold.  Trim any bushes or trees on the path to your home.  Use bright outdoor lighting.  Clear any walking paths of anything that might make someone trip, such as rocks or tools.  Regularly check to see if handrails are loose or broken. Make sure that both sides of any steps have handrails.  Any raised decks and porches should have guardrails on the edges.  Have any leaves, snow, or ice cleared regularly.  Use sand or salt on walking paths during winter.  Clean up any spills in your garage right away. This includes oil or grease spills. What can I do in the bathroom?  Use night lights.  Install grab bars by the toilet and in the tub and shower. Do not use towel bars as grab bars.  Use  non-skid mats or decals in the tub or shower.  If you need to sit down in the shower, use a plastic, non-slip stool.  Keep the floor dry. Clean up any water that spills on the floor as soon as it happens.  Remove soap buildup in the tub or shower regularly.  Attach bath mats securely with double-sided non-slip rug tape.  Do not have throw rugs and other things on the floor that can make you trip. What can I do in the bedroom?  Use night lights.  Make sure that you have a light by your bed that is easy to reach.  Do not use any sheets or blankets that are too big for your bed. They should not hang down onto the floor.  Have a firm chair that has side arms. You can use this for support while you get dressed.  Do not have throw rugs and other things on the floor that can make you trip. What can I do in the kitchen?  Clean up any spills right away.  Avoid walking on wet floors.  Keep items that you use a lot in easy-to-reach places.  If you need to reach something above you, use a strong step stool that has a grab bar.  Keep electrical cords out  of the way.  Do not use floor polish or wax that makes floors slippery. If you must use wax, use non-skid floor wax.  Do not have throw rugs and other things on the floor that can make you trip. What can I do with my stairs?  Do not leave any items on the stairs.  Make sure that there are handrails on both sides of the stairs and use them. Fix handrails that are broken or loose. Make sure that handrails are as long as the stairways.  Check any carpeting to make sure that it is firmly attached to the stairs. Fix any carpet that is loose or worn.  Avoid having throw rugs at the top or bottom of the stairs. If you do have throw rugs, attach them to the floor with carpet tape.  Make sure that you have a light switch at the top of the stairs and the bottom of the stairs. If you do not have them, ask someone to add them for you. What  else can I do to help prevent falls?  Wear shoes that:  Do not have high heels.  Have rubber bottoms.  Are comfortable and fit you well.  Are closed at the toe. Do not wear sandals.  If you use a stepladder:  Make sure that it is fully opened. Do not climb a closed stepladder.  Make sure that both sides of the stepladder are locked into place.  Ask someone to hold it for you, if possible.  Clearly mark and make sure that you can see:  Any grab bars or handrails.  First and last steps.  Where the edge of each step is.  Use tools that help you move around (mobility aids) if they are needed. These include:  Canes.  Walkers.  Scooters.  Crutches.  Turn on the lights when you go into a dark area. Replace any light bulbs as soon as they burn out.  Set up your furniture so you have a clear path. Avoid moving your furniture around.  If any of your floors are uneven, fix them.  If there are any pets around you, be aware of where they are.  Review your medicines with your doctor. Some medicines can make you feel dizzy. This can increase your chance of falling. Ask your doctor what other things that you can do to help prevent falls. This information is not intended to replace advice given to you by your health care provider. Make sure you discuss any questions you have with your health care provider. Document Released: 10/29/2008 Document Revised: 06/10/2015 Document Reviewed: 02/06/2014 Elsevier Interactive Patient Education  2017 [employer].

## 2019-04-17 ENCOUNTER — Other Ambulatory Visit: Payer: Self-pay

## 2019-04-17 ENCOUNTER — Telehealth (INDEPENDENT_AMBULATORY_CARE_PROVIDER_SITE_OTHER): Payer: Medicare Other | Admitting: Family Medicine

## 2019-05-06 NOTE — Progress Notes (Signed)
No visit today - pt due for routine in June

## 2019-05-21 ENCOUNTER — Telehealth: Payer: Self-pay | Admitting: Family Medicine

## 2019-05-21 NOTE — Telephone Encounter (Signed)
Pt needs appt

## 2019-05-21 NOTE — Chronic Care Management (AMB) (Signed)
  Chronic Care Management   Note  05/21/2019 Name: JEMIMA PETKO MRN: [id number] DOB: Nov 20, 1960  DALYLAH RAMEY is a 59 y.o. year old female who is a primary care patient of Delsa Grana, [place]. I reached out to [name] by phone today in response to a referral sent by Ms. Gwenlyn Perking [place]'s health plan.     Ms. Birenbaum was given information about Chronic Care Management services today including:  1. CCM service includes personalized support from designated clinical staff supervised by her physician, including individualized plan of care and coordination with other care providers 2. 24/7 contact phone numbers for assistance for urgent and routine care needs. 3. Service will only be billed when office clinical staff spend 20 minutes or more in a month to coordinate care. 4. Only one practitioner may furnish and bill the service in a calendar month. 5. The patient may stop CCM services at any time (effective at the end of the month) by phone call to the office staff. 6. The patient will be responsible for cost sharing (co-pay) of up to 20% of the service fee (after annual deductible is met).  Patient agreed to services and verbal consent obtained.   Follow up plan: Telephone appointment with care management team member scheduled for:06/10/2019  Noreene Larsson, [place], [place], [place] [address] Direct Dial: [phone] [email or web address] Website: [email or web address]

## 2019-05-21 NOTE — Telephone Encounter (Signed)
nystatin (MYCOSTATIN/NYSTOP) 100000 UNIT/GM POWD   Patient is requesting a refill of this medication.    Pharmacy:  [id number] - [place], [address] Phone:  [phone]  Fax:  [phone]

## 2019-05-21 NOTE — Telephone Encounter (Signed)
Pt said she has enough meds to do her till her 32m follow up on June 1st

## 2019-06-10 ENCOUNTER — Telehealth: Payer: Medicare Other

## 2019-06-10 NOTE — Chronic Care Management (AMB) (Deleted)
Chronic Care Management Pharmacy  Name: Deborah Blackwell  MRN: [id number] DOB: September 28, 1960  Chief Complaint/ HPI  Deborah Blackwell,  59 y.o. , female presents for their Initial CCM visit with the clinical pharmacist via telephone due to COVID-19 Pandemic.  PCP : Delsa Grana, PA-C  Their chronic conditions include: DM, HTN, HLD, Hypothyroidism, COPD  Office Visits: 12/9 DM, [place], BP 122/88 P 96 Wt 176 BMI 33.3, A1c 5.9%, husband died 2.5 ya, no pulm follow up, Levaquin 750mg  daily x 7d, can't tolerate PO steroids, dex 10mg  inj x 1 10/21 COPD, [place], speaking difficult, won't go to urgent care, Covid neg, Hycodan syrup, doxycycline, prednisone, Z-pak, Mucinex  Consult Visit:   Medications: Outpatient Encounter Medications as of 06/10/2019  Medication Sig  . albuterol (VENTOLIN HFA) 108 (90 Base) MCG/ACT inhaler Inhale 2 puffs into the lungs every 4 (four) hours as needed for wheezing or shortness of breath.  . ALPRAZolam (XANAX) 1 MG tablet Take 1 tablet by mouth 2 (two) times daily.  Marland Kitchen atorvastatin (LIPITOR) 80 MG tablet TAKE 1 TABLET BY MOUTH EVERYDAY AT BEDTIME  . budesonide-formoterol (SYMBICORT) 160-4.5 MCG/ACT inhaler Inhale 2 puffs into the lungs 2 (two) times daily.  . citalopram (CELEXA) 20 MG tablet Take 20 mg by mouth daily.  . fluticasone (FLONASE) 50 MCG/ACT nasal spray Place 2 sprays into both nostrils daily.  Marland Kitchen ipratropium-albuterol (DUONEB) 0.5-2.5 (3) MG/3ML SOLN Take 3 mLs by nebulization every 6 (six) hours as needed (for coughing fits, wheeze, SOB).  Marland Kitchen levocetirizine (XYZAL) 5 MG tablet TAKE 1 TABLET BY MOUTH EVERY DAY IN THE EVENING  . levothyroxine (SYNTHROID) 50 MCG tablet TAKE 1 TABLET BY MOUTH DAILY BEFORE BREAKFAST  . sertraline (ZOLOFT) 100 MG tablet Take 100 mg by mouth daily.   . traZODone (DESYREL) 100 MG tablet Take 50-100 mg by mouth at bedtime as needed.  . valsartan (DIOVAN) 80 MG tablet Take 1 tablet (80 mg total) by mouth daily. For blood pressure   No  facility-administered encounter medications on file as of 06/10/2019.     Current Diagnosis/Assessment:  Goals Addressed   None    COPD / Asthma / Tobacco   Last spirometry score: ***  Gold Grade: {CHL HP [facility] Pharm COPD Gold WW:[id number] Current COPD Classification:  {CHL HP Upstream Pharm COPD Classification:[phone]}  Eosinophil count:   Lab Results  Component Value Date/Time   EOSPCT 3.7 12/25/2018 12:00 AM  %                               Eos (Absolute):  Lab Results  Component Value Date/Time   EOSABS 285 12/25/2018 12:00 AM   EOSABS 0.1 02/26/2015 02:20 PM    Tobacco Status:  Social History   Tobacco Use  Smoking Status Former Smoker  . Packs/day: 2.00  . Years: 5.00  . Pack years: 10.00  . Types: Cigarettes  . Quit date: 02/02/1991  . Years since quitting: 28.3  Smokeless Tobacco Never Used    Patient has failed these meds in past: oral steroids Patient is currently {CHL Controlled/Uncontrolled:[phone]} on the following medications: *** Using maintenance inhaler regularly? {yes/no:20286} Frequency of rescue inhaler use:  {CHL HP [facility] Pharm Inhaler KL:[id number]  We discussed:   Using inhaled steroid?  Plan  Continue {CHL HP Upstream Pharmacy [phone]}  Diabetes   Recent Relevant Labs: Lab Results  Component Value Date/Time   HGBA1C 5.7 (H) 12/25/2018 12:00 AM  HGBA1C 5.9 (H) 10/01/2017 11:39 AM   HGBA1C 5.8 08/07/2014 02:04 PM   MICROALBUR 0.7 01/02/2017 02:35 PM   MICROALBUR 1.0 09/30/2015 03:06 PM   MICROALBUR 10 08/07/2014 02:04 PM     Checking BG: {CHL HP Blood Glucose Monitoring Frequency:[phone]}  Recent FBG Readings: Recent pre-meal BG readings: *** Recent 2hr PP BG readings:  *** Recent HS BG readings: *** Patient has failed these meds in past: *** Patient is currently {CHL Controlled/Uncontrolled:[phone]} on the following medications: ***  Last diabetic Foot exam:  Lab Results  Component  Value Date/Time   HMDIABEYEEXA No Retinopathy 08/01/2016 12:00 AM    Last diabetic Eye exam: No results found for: HMDIABFOOTEX   We discussed: {CHL HP Upstream Pharmacy discussion:[phone]}  Plan  Continue {CHL HP Upstream Pharmacy Plans:[phone]}  Hyperlipidemia   Lipid Panel     Component Value Date/Time   CHOL 162 12/25/2018 0000   CHOL 203 (H) 02/26/2015 1420   CHOL 196 08/07/2014 1404   TRIG 67 12/25/2018 0000   TRIG 100 08/07/2014 1404   HDL 47 (L) 12/25/2018 0000   HDL 38 (L) 02/26/2015 1420   CHOLHDL 3.4 12/25/2018 0000   VLDL 18 09/30/2015 1506   VLDL 20 08/07/2014 1404   LDLCALC 100 (H) 12/25/2018 0000   LABVLDL 19 02/26/2015 1420     The 10-year ASCVD risk score Mikey Bussing DC Jr., et al., 2013) is: 7.7%   Values used to calculate the score:     Age: 26 years     Sex: Female     Is Non-Hispanic African American: No     Diabetic: Yes     Tobacco smoker: No     Systolic Blood Pressure: 123XX123 mmHg     Is BP treated: Yes     HDL Cholesterol: 47 mg/dL     Total Cholesterol: 162 mg/dL   Patient has failed these meds in past: *** Patient is currently uncontrolled on the following medications:  . Lipitor 80mg  daily  We discussed:   Taking Lipitor regularly?  Plan  Continue {CHL HP [facility] Pharmacy PH:[id number]   Depression    Patient has failed these meds in past: Zoloft Patient is currently uncontrolled on the following medications: Celexa 20mg  daily, Xanax  We discussed:  {CHL HP Upstream Pharmacy discussion:[phone]}  Plan  Continue {CHL HP Upstream Pharmacy Plans:[phone]}  Medication Management   [facility] Uses pill box? {Yes or If no, why not?:20788} Pt endorses ***% compliance  We discussed: ***  Plan  {US Pharmacy MN:[id number]    Follow up: *** month phone visit

## 2019-06-17 ENCOUNTER — Other Ambulatory Visit: Payer: Self-pay

## 2019-06-17 ENCOUNTER — Encounter: Payer: Self-pay | Admitting: Family Medicine

## 2019-06-17 ENCOUNTER — Ambulatory Visit (INDEPENDENT_AMBULATORY_CARE_PROVIDER_SITE_OTHER): Payer: Medicare Other | Admitting: Family Medicine

## 2019-06-17 VITALS — BP 122/80 | HR 96 | Temp 97.5°F | Resp 14 | Ht 61.0 in | Wt 162.0 lb

## 2019-06-17 DIAGNOSIS — I10 Essential (primary) hypertension: Secondary | ICD-10-CM

## 2019-06-17 DIAGNOSIS — F331 Major depressive disorder, recurrent, moderate: Secondary | ICD-10-CM

## 2019-06-17 DIAGNOSIS — R350 Frequency of micturition: Secondary | ICD-10-CM

## 2019-06-17 DIAGNOSIS — E1140 Type 2 diabetes mellitus with diabetic neuropathy, unspecified: Secondary | ICD-10-CM

## 2019-06-17 DIAGNOSIS — E559 Vitamin D deficiency, unspecified: Secondary | ICD-10-CM

## 2019-06-17 DIAGNOSIS — E039 Hypothyroidism, unspecified: Secondary | ICD-10-CM | POA: Diagnosis not present

## 2019-06-17 DIAGNOSIS — E782 Mixed hyperlipidemia: Secondary | ICD-10-CM

## 2019-06-17 DIAGNOSIS — J449 Chronic obstructive pulmonary disease, unspecified: Secondary | ICD-10-CM

## 2019-06-17 DIAGNOSIS — M109 Gout, unspecified: Secondary | ICD-10-CM

## 2019-06-17 DIAGNOSIS — Z780 Asymptomatic menopausal state: Secondary | ICD-10-CM

## 2019-06-17 DIAGNOSIS — Z5181 Encounter for therapeutic drug level monitoring: Secondary | ICD-10-CM

## 2019-06-17 DIAGNOSIS — M4696 Unspecified inflammatory spondylopathy, lumbar region: Secondary | ICD-10-CM | POA: Insufficient documentation

## 2019-06-17 DIAGNOSIS — Z1211 Encounter for screening for malignant neoplasm of colon: Secondary | ICD-10-CM

## 2019-06-17 LAB — POCT URINALYSIS DIPSTICK
Bilirubin, UA: NEGATIVE
Blood, UA: NEGATIVE
Glucose, UA: NEGATIVE
Ketones, UA: NEGATIVE
Leukocytes, UA: NEGATIVE
Nitrite, UA: NEGATIVE
Protein, UA: NEGATIVE
Spec Grav, UA: 1.015 (ref 1.010–1.025)
Urobilinogen, UA: 0.2 E.U./dL
pH, UA: 5.5 (ref 5.0–8.0)

## 2019-06-17 MED ORDER — NYSTATIN 100000 UNIT/GM EX POWD: CUTANEOUS | 1 refills | Status: DC

## 2019-06-17 NOTE — Progress Notes (Signed)
Name: Deborah Blackwell   MRN: [id number]    DOB: 02-21-1960   Date:06/17/2019       Progress Note  Chief Complaint  Patient presents with  . Follow-up  . Depression  . Hypertension  . Hyperlipidemia     Subjective:   Deborah Blackwell is a 59 y.o. female, presents to clinic for routine follow up on the conditions listed above.  Hypertension:  Currently managed on valsartan 80 mg Pt reports good med compliance and denies any SE.  No lightheadedness, hypotension, syncope. Blood pressure today is well controlled. BP Readings from Last 3 Encounters:  06/17/19 122/80  03/27/19 126/88  12/25/18 122/88   Pt denies CP, SOB, exertional sx, LE edema, palpitation, Ha's, visual disturbances  Hyperlipidemia: Current Medication Regimen:  lipitor 80 mg daily, good compliance Last Lipids: Lab Results  Component Value Date   CHOL 162 12/25/2018   HDL 47 (L) 12/25/2018   LDLCALC 100 (H) 12/25/2018   TRIG 67 12/25/2018   CHOLHDL 3.4 12/25/2018   Diabetes Mellitus Type II: prediabetes? Hx of being well controlled Currently managing with diet and lifestyle No hypoglycemic episodes Denies: Polyuria, polydipsia, polyphagia, vision changes, or neuropathy  Recent pertinent labs: Lab Results  Component Value Date   HGBA1C 5.7 (H) 12/25/2018   HGBA1C 5.9 (H) 10/01/2017   HGBA1C 5.7 (H) 01/02/2017    Hypothyroidism: Current Medication Regimen: 50 mcg synthroid Takes medicine daily in the morning without food Current Symptoms: Mood a little down but otherwise no symptoms Most recent results are below; we will be repeating labs today. Lab Results  Component Value Date   TSH 1.24 12/25/2018    Urinary frequency worsening - incontinence with coughing laughing, frequency    Still dealing with depression goes back and forth, had meds changed from zoloft to celexa 20 mg and for anxiety taking xanax prescribed by Psych - attempted to check controlled substance database, but not able to connect  or review during visit Depression screen [facility] 2/9 06/17/2019 03/27/2019 12/25/2018  Decreased Interest 2 1 1   Down, Depressed, Hopeless 2 1 -  PHQ - 2 Score 4 2 1   Altered sleeping 2 0 1  Tired, decreased energy 2 1 0  Change in appetite 0 1 0  Feeling bad or failure about yourself  1 0 0  Trouble concentrating 1 1 0  Moving slowly or fidgety/restless 0 1 0  Suicidal thoughts 0 0 0  PHQ-9 Score 10 6 2   Difficult doing work/chores Somewhat difficult Somewhat difficult Not difficult at all  Some recent data might be hidden  She only change her medications a few days ago  Some weight loss over the past 6 months Wt Readings from Last 8 Encounters:  06/17/19 162 lb (73.5 kg)  03/27/19 172 lb (78 kg)  12/25/18 176 lb (79.8 kg)  03/22/18 164 lb 12.8 oz (74.8 kg)  01/14/18 164 lb (74.4 kg)  10/01/17 168 lb 8 oz (76.4 kg)  01/26/17 144 lb (65.3 kg)  01/23/17 144 lb (65.3 kg)   BMI Readings from Last 5 Encounters:  06/17/19 30.61 kg/m  03/27/19 32.50 kg/m  12/25/18 33.25 kg/m  03/22/18 31.14 kg/m  01/14/18 30.99 kg/m   COPD-on Symbicort, using DuoNebs, her dyspnea on exertion is much worse she can only walk a few feet before having to stop and rest she has difficulty getting into the clinic today or down the hallway.  Patient Active Problem List   Diagnosis Date Noted  . Major  depression, recurrent ([place]) 01/05/2017  . Abnormal grief reaction 01/05/2017  . Chronic pain syndrome 03/07/2016  . Vitamin D deficiency 11/10/2015  . Elevated C-reactive protein (CRP) 11/10/2015  . Arthralgia 10/20/2015  . Lumbar facet hypertrophy (Bilateral) 10/20/2015  . Lumbar facet syndrome (Location of Primary Source of Pain) (Right) 10/20/2015  . Lumbar spondylosis 10/20/2015  . Osteopenia 10/20/2015  . Chronic shoulder pain (Right) 10/18/2015  . Chronic low back pain (Location of Primary Source of Pain) (Right) 10/18/2015  . Chronic lower extremity pain (Location of Secondary source of pain)  (Right) 10/18/2015  . Occipital neuralgia (Right) 10/18/2015  . Cervicogenic headache 10/18/2015  . Drug-seeking behavior 10/06/2015  . Type 2 diabetes, controlled, with neuropathy ([place]) 09/30/2014  . Chronic neck pain (Location of Tertiary source of pain) (Right) 09/30/2014  . Cervical disc disorder with radiculopathy of cervical region 09/30/2014  . Gout 08/07/2014  . Hyperlipidemia 08/07/2014  . Hypothyroidism 08/07/2014  . Congenital scoliosis 08/07/2014  . COPD (chronic obstructive pulmonary disease) ([place])   . Hypertension     Past Surgical History:  Procedure Laterality Date  . ABDOMINAL HYSTERECTOMY  2003   one ovary remains: due to heavy bleeding/endometrioma/cysts  . BACK SURGERY     and injections  . CARPAL TUNNEL RELEASE    . FOOT SURGERY Right    bone spur  . POLYPECTOMY     uterine    Family History  Problem Relation Age of Onset  . Alzheimer's disease Mother   . Dementia Mother   . Cancer Mother        colon  . Heart disease Mother   . Hypertension Mother   . Depression Mother   . Alzheimer's disease Father   . Dementia Father   . Hypertension Father   . Cancer Brother        lung  . Lung disease Brother   . Hypertension Sister   . Pneumonia Brother   . Heart failure Brother     Social History   Tobacco Use  . Smoking status: Former Smoker    Packs/day: 2.00    Years: 5.00    Pack years: 10.00    Types: Cigarettes    Quit date: 02/02/1991    Years since quitting: 28.3  . Smokeless tobacco: Never Used  Substance Use Topics  . Alcohol use: No    Alcohol/week: 0.0 standard drinks  . Drug use: No      Current Outpatient Medications:  .  albuterol (VENTOLIN HFA) 108 (90 Base) MCG/ACT inhaler, Inhale 2 puffs into the lungs every 4 (four) hours as needed for wheezing or shortness of breath., Disp: 1 Inhaler, Rfl: 0 .  ALPRAZolam (XANAX) 1 MG tablet, Take 1 tablet by mouth 2 (two) times daily., Disp: , Rfl: 2 .  atorvastatin (LIPITOR) 80 MG  tablet, TAKE 1 TABLET BY MOUTH EVERYDAY AT BEDTIME, Disp: 90 tablet, Rfl: 1 .  budesonide-formoterol (SYMBICORT) 160-4.5 MCG/ACT inhaler, Inhale 2 puffs into the lungs 2 (two) times daily., Disp: 1 Inhaler, Rfl: 12 .  citalopram (CELEXA) 20 MG tablet, Take 20 mg by mouth daily., Disp: , Rfl:  .  fluticasone (FLONASE) 50 MCG/ACT nasal spray, Place 2 sprays into both nostrils daily., Disp: 16 g, Rfl: 6 .  ipratropium-albuterol (DUONEB) 0.5-2.5 (3) MG/3ML SOLN, Take 3 mLs by nebulization every 6 (six) hours as needed (for coughing fits, wheeze, SOB)., Disp: 180 mL, Rfl: 1 .  levocetirizine (XYZAL) 5 MG tablet, TAKE 1 TABLET BY MOUTH EVERY DAY IN  THE EVENING, Disp: 90 tablet, Rfl: 1 .  levothyroxine (SYNTHROID) 50 MCG tablet, TAKE 1 TABLET BY MOUTH DAILY BEFORE BREAKFAST, Disp: 90 tablet, Rfl: 1 .  valsartan (DIOVAN) 80 MG tablet, Take 1 tablet (80 mg total) by mouth daily. For blood pressure, Disp: 90 tablet, Rfl: 1  Allergies  Allergen Reactions  . Contrast Media [Iodinated Diagnostic Agents] Shortness Of Breath  . Aspirin Nausea And Vomiting  . Macrobid [Nitrofurantoin Monohyd Macro] Hives and Nausea Only    Chart Review Today: I personally reviewed active problem list, medication list, allergies, family history, social history, health maintenance, notes from last encounter, lab results, imaging with the patient/caregiver today.   Review of Systems  10 Systems reviewed and are negative for acute change except as noted in the HPI.   Objective:    Vitals:   06/17/19 1054  BP: 122/80  Pulse: 96  Resp: 14  Temp: (!) 97.5 F (36.4 C)  SpO2: 94%  Weight: 162 lb (73.5 kg)  Height: 5\' 1"  (1.549 m)    Body mass index is 30.61 kg/m.  Physical Exam Vitals and nursing note reviewed.  Constitutional:      General: She is not in acute distress.    Appearance: Normal appearance. She is not toxic-appearing or diaphoretic.  HENT:     Right Ear: External ear normal.     Left Ear:  External ear normal.  Cardiovascular:     Rate and Rhythm: Normal rate and regular rhythm.     Pulses: Normal pulses.     Heart sounds: Normal heart sounds.  Pulmonary:     Breath sounds: Wheezing present. No rhonchi or rales.     Comments: Tachypnea without retractions or accessory muscle use with ambulation at rest she has normal respiratory rate, no respiratory distress Musculoskeletal:     Right lower leg: No edema.     Left lower leg: No edema.  Skin:    General: Skin is warm and dry.     Coloration: Skin is not jaundiced or pale.  Neurological:     Mental Status: She is alert. Mental status is at baseline.  Psychiatric:        Attention and Perception: Attention normal.        Mood and Affect: Mood is depressed.        Speech: Speech normal.        Behavior: Behavior normal. Behavior is cooperative.        Thought Content: Thought content normal.       Diabetic Foot Exam: Diabetic Foot Exam - Simple   Simple Foot Form Diabetic Foot exam was performed with the following findings: Yes 06/17/2019 11:30 AM  Visual Inspection No deformities, no ulcerations, no other skin breakdown bilaterally: Yes Sensation Testing Intact to touch and monofilament testing bilaterally: Yes Pulse Check Posterior Tibialis and Dorsalis pulse intact bilaterally: Yes Comments     Fall Risk: Fall Risk  06/17/2019 03/27/2019 12/25/2018 10/31/2018 03/22/2018  Falls in the past year? 0 0 0 0 0  Comment - - - - -  Number falls in past yr: 0 0 0 0 0  Comment - - - - -  Injury with Fall? 0 0 0 0 0  Comment - - - - -  Risk for fall due to : - No Fall Risks - - -  Follow up - Falls prevention discussed - - Falls prevention discussed    Functional Status Survey: Is the patient deaf or have difficulty hearing?:  No Does the patient have difficulty seeing, even when wearing glasses/contacts?: No Does the patient have difficulty concentrating, remembering, or making decisions?: No Does the patient have  difficulty walking or climbing stairs?: No Does the patient have difficulty dressing or bathing?: No Does the patient have difficulty doing errands alone such as visiting a doctor's office or shopping?: No   Assessment & Plan:      ICD-10-CM   1. Essential hypertension  99991111 COMPLETE METABOLIC PANEL WITH GFR   well controlled on valsartan   2. Urinary frequency  R35.0 Urine Culture    POCT urinalysis dipstick   incontinence and frequency all day and at night at least 2x a night, drinking ample water  3. Hypothyroidism, unspecified type  E03.9 TSH   compliant with meds, last  TSH was normal, intensional weight loss  4. Type 2 diabetes, controlled, with neuropathy (HCC)  E11.40 Hemoglobin A1c    Lipid panel    COMPLETE METABOLIC PANEL WITH GFR   no meds, lifestyle, recheck and then change dx to prediabetes, well controlled   5. Mixed hyperlipidemia  E78.2 Lipid panel    COMPLETE METABOLIC PANEL WITH GFR   Compliant with statin, no myalgias or concerns  6. Vitamin D deficiency  E55.9    Reviewed appropriate supplementation for age, reviewed last labs normal  7. Gout, unspecified cause, unspecified chronicity, unspecified site  M10.9    well controlled, uric acid in the past low  8. Unspecified inflammatory spondylopathy, lumbar region [facility])  M46.96    Patient previously managed by pain management, has been trying to lose weight and be active to decrease pain  9. Chronic obstructive pulmonary disease, unspecified COPD type ([place]) Chronic J44.9 Ambulatory referral to Pulmonology   on albuterol and duoneb prn, also using symbicort daily - still SOB with exertion, can only walk about 20 yards prior to SOB and needing to rest  10. Moderate episode of recurrent major depressive disorder (HCC) Chronic F33.1    celexa 20 mg - just changed meds and has f/up with psych  11. Screen for colon cancer  Z12.11 Ambulatory referral to Gastroenterology  12. Postmenopausal estrogen deficiency  Z78.0 DG  Bone Density  13. Encounter for medication monitoring  Z51.81 TSH    Hemoglobin A1c    Lipid panel    COMPLETE METABOLIC PANEL WITH GFR    CBC with Differential/Platelet    Pt encouraged to start trelegy  She complained of urinary sx, but unable to fully address today with 20 min visit.  Encouraged her to see urinary frequency handout, avoid bladder irritants, keep a journal and get a follow-up appointment to further address  Delsa Grana, PA-C 06/17/19 11:29 AM

## 2019-06-17 NOTE — Patient Instructions (Addendum)
F/up for appointment on urinary symptoms  Urinary Frequency, Adult Urinary frequency means urinating more often than usual. You may urinate every 1-2 hours even though you drink a normal amount of fluid and do not have a bladder infection or condition. Although you urinate more often than normal, the total amount of urine produced in a day is normal. With urinary frequency, you may have an urgent need to urinate often. The stress and anxiety of needing to find a bathroom quickly can make this urge worse. This condition may go away on its own or you may need treatment at home. Home treatment may include bladder training, exercises, taking medicines, or making changes to your diet. Follow these instructions at home: Bladder health   Keep a bladder diary if told by your health care provider. Keep track of: ? What you eat and drink. ? How often you urinate. ? How much you urinate.  Follow a bladder training program if told by your health care provider. This may include: ? Learning to delay going to the bathroom. ? Double urinating (voiding). This helps if you are not completely emptying your bladder. ? Scheduled voiding.  Do Kegel exercises as told by your health care provider. Kegel exercises strengthen the muscles that help control urination, which may help the condition. Eating and drinking  If told by your health care provider, make diet changes, such as: ? Avoiding caffeine. ? Drinking fewer fluids, especially alcohol. ? Not drinking in the evening. ? Avoiding foods or drinks that may irritate the bladder. These include coffee, tea, soda, artificial sweeteners, citrus, tomato-based foods, and chocolate. ? Eating foods that help prevent or ease constipation. Constipation can make this condition worse. Your health care provider may recommend that you:  Drink enough fluid to keep your urine pale yellow.  Take over-the-counter or prescription medicines.  Eat foods that are high in  fiber, such as beans, whole grains, and fresh fruits and vegetables.  Limit foods that are high in fat and processed sugars, such as fried or sweet foods. General instructions  Take over-the-counter and prescription medicines only as told by your health care provider.  Keep all follow-up visits as told by your health care provider. This is important. Contact a health care provider if:  You start urinating more often.  You feel pain or irritation when you urinate.  You notice blood in your urine.  Your urine looks cloudy.  You develop a fever.  You begin vomiting. Get help right away if:  You are unable to urinate. Summary  Urinary frequency means urinating more often than usual. With urinary frequency, you may urinate every 1-2 hours even though you drink a normal amount of fluid and do not have a bladder infection or other bladder condition.  Your health care provider may recommend that you keep a bladder diary, follow a bladder training program, or make dietary changes.  If told by your health care provider, do Kegel exercises to strengthen the muscles that help control urination.  Take over-the-counter and prescription medicines only as told by your health care provider.  Contact a health care provider if your symptoms do not improve or get worse. This information is not intended to replace advice given to you by your health care provider. Make sure you discuss any questions you have with your health care provider. Document Revised: 07/12/2017 Document Reviewed: 07/12/2017 Elsevier Patient Education  2020 [facility] [address]  [place],  [place]  [address] Get Driving Directions Main: [phone]

## 2019-06-18 LAB — TSH: TSH: 2.34 mIU/L (ref 0.40–4.50)

## 2019-06-18 LAB — URINE CULTURE
MICRO NUMBER:: 10539517
Result:: NO GROWTH
SPECIMEN QUALITY:: ADEQUATE

## 2019-06-18 LAB — CBC WITH DIFFERENTIAL/PLATELET
Absolute Monocytes: 545 cells/uL (ref 200–950)
Basophils Absolute: 32 cells/uL (ref 0–200)
Basophils Relative: 0.4 %
Eosinophils Absolute: 221 cells/uL (ref 15–500)
Eosinophils Relative: 2.8 %
HCT: 43.5 % (ref 35.0–45.0)
Hemoglobin: 14.2 g/dL (ref 11.7–15.5)
Lymphs Abs: 1525 cells/uL (ref 850–3900)
MCH: 28.5 pg (ref 27.0–33.0)
MCHC: 32.6 g/dL (ref 32.0–36.0)
MCV: 87.3 fL (ref 80.0–100.0)
MPV: 10.2 fL (ref 7.5–12.5)
Monocytes Relative: 6.9 %
Neutro Abs: 5577 cells/uL (ref 1500–7800)
Neutrophils Relative %: 70.6 %
Platelets: 280 10*3/uL (ref 140–400)
RBC: 4.98 10*6/uL (ref 3.80–5.10)
RDW: 13.1 % (ref 11.0–15.0)
Total Lymphocyte: 19.3 %
WBC: 7.9 10*3/uL (ref 3.8–10.8)

## 2019-06-18 LAB — COMPLETE METABOLIC PANEL WITH GFR
AG Ratio: 1.6 (calc) (ref 1.0–2.5)
ALT: 18 U/L (ref 6–29)
AST: 19 U/L (ref 10–35)
Albumin: 4.5 g/dL (ref 3.6–5.1)
Alkaline phosphatase (APISO): 99 U/L (ref 37–153)
BUN: 18 mg/dL (ref 7–25)
CO2: 31 mmol/L (ref 20–32)
Calcium: 9.7 mg/dL (ref 8.6–10.4)
Chloride: 101 mmol/L (ref 98–110)
Creat: 0.86 mg/dL (ref 0.50–0.99)
GFR, Est African American: 85 mL/min/{1.73_m2} (ref >=60)
GFR, Est Non African American: 73 mL/min/{1.73_m2} (ref >=60)
Globulin: 2.9 g/dL (calc) (ref 1.9–3.7)
Glucose, Bld: 88 mg/dL (ref 65–99)
Potassium: 4.3 mmol/L (ref 3.5–5.3)
Sodium: 139 mmol/L (ref 135–146)
Total Bilirubin: 0.7 mg/dL (ref 0.2–1.2)
Total Protein: 7.4 g/dL (ref 6.1–8.1)

## 2019-06-18 LAB — LIPID PANEL
Cholesterol: 206 mg/dL — ABNORMAL HIGH (ref <200)
HDL: 30 mg/dL — ABNORMAL LOW (ref >=50)
LDL Cholesterol (Calc): 154 mg/dL (calc) — ABNORMAL HIGH
Non-HDL Cholesterol (Calc): 176 mg/dL (calc) — ABNORMAL HIGH (ref <130)
Total CHOL/HDL Ratio: 6.9 (calc) — ABNORMAL HIGH (ref <5.0)
Triglycerides: 106 mg/dL (ref <150)

## 2019-06-18 LAB — HEMOGLOBIN A1C
Hgb A1c MFr Bld: 5.3 % of total Hgb (ref <5.7)
Mean Plasma Glucose: 105 (calc)
eAG (mmol/L): 5.8 (calc)

## 2019-06-20 ENCOUNTER — Other Ambulatory Visit: Payer: Self-pay | Admitting: Family Medicine

## 2019-06-20 DIAGNOSIS — J449 Chronic obstructive pulmonary disease, unspecified: Secondary | ICD-10-CM

## 2019-06-20 DIAGNOSIS — J069 Acute upper respiratory infection, unspecified: Secondary | ICD-10-CM

## 2019-06-20 DIAGNOSIS — J310 Chronic rhinitis: Secondary | ICD-10-CM

## 2019-06-20 DIAGNOSIS — I10 Essential (primary) hypertension: Secondary | ICD-10-CM

## 2019-06-20 DIAGNOSIS — R350 Frequency of micturition: Secondary | ICD-10-CM

## 2019-06-20 DIAGNOSIS — J441 Chronic obstructive pulmonary disease with (acute) exacerbation: Secondary | ICD-10-CM

## 2019-06-20 DIAGNOSIS — F331 Major depressive disorder, recurrent, moderate: Secondary | ICD-10-CM

## 2019-06-20 DIAGNOSIS — E039 Hypothyroidism, unspecified: Secondary | ICD-10-CM

## 2019-06-20 MED ORDER — FLUTICASONE PROPIONATE 50 MCG/ACT NA SUSP: 2.0000 | Freq: Every day | NASAL | 6 refills | Status: DC

## 2019-06-20 MED ORDER — TRELEGY ELLIPTA 200-62.5-25 MCG/INH IN AEPB: 1.0000 | INHALATION_SPRAY | Freq: Every day | RESPIRATORY_TRACT | 11 refills | Status: DC

## 2019-06-20 MED ORDER — LEVOCETIRIZINE DIHYDROCHLORIDE 5 MG PO TABS: ORAL_TABLET | ORAL | 3 refills | Status: DC

## 2019-06-20 MED ORDER — LEVOTHYROXINE SODIUM 50 MCG PO TABS: 50.0000 ug | ORAL_TABLET | Freq: Every day | ORAL | 3 refills | Status: DC

## 2019-06-20 MED ORDER — VALSARTAN 80 MG PO TABS: 80.0000 mg | ORAL_TABLET | Freq: Every day | ORAL | 3 refills | Status: DC

## 2019-06-26 ENCOUNTER — Encounter: Payer: Self-pay | Admitting: Family Medicine

## 2019-09-26 NOTE — Progress Notes (Deleted)
Name: CHENEY GOSCH   MRN: [id number]    DOB: 04/22/1960   Date:09/26/2019       Progress Note  No chief complaint on file.    Subjective:   FLORABELLE CARDIN is a 59 y.o. female, presents to clinic for  DM:   Pt managing DM with no meds Reports *** med compliance Pt has *** SE from meds. Blood sugars *** Denies: Polyuria, polydipsia, vision changes, neuropathy, hypoglycemia Recent pertinent labs: Lab Results  Component Value Date   HGBA1C 5.3 06/17/2019   HGBA1C 5.7 (H) 12/25/2018   HGBA1C 5.9 (H) 10/01/2017   Standard of care and health maintenance: Urine Microalbumin:  ordered Foot exam:  06/17/2019 DM eye exam:  Ordered last visit ACEI/ARB:  Valsartan Statin:  Lipitor 80 mg qd  Hypertension:  Currently managed on Valsartan 80 mg qd Pt reports *** med compliance and denies any SE.   Blood pressure today is *** controlled. BP Readings from Last 3 Encounters:  06/17/19 122/80  03/27/19 126/88  12/25/18 122/88   Pt denies CP, SOB, exertional sx, LE edema, palpitation, Ha's, visual disturbances, lightheadedness, hypotension, syncope. Dietary efforts for BP?  ***    Hyperlipidemia: Currently treated with Lipitor 80 mg qd , pt reports *** med compliance Last Lipids: Lab Results  Component Value Date   CHOL 206 (H) 06/17/2019   HDL 30 (L) 06/17/2019   LDLCALC 154 (H) 06/17/2019   TRIG 106 06/17/2019   CHOLHDL 6.9 (H) 06/17/2019   - {ACTIONS;DENIES/REPORTS:[id number]::"Denies"}: Chest pain, shortness of breath, myalgias, claudication   Current Outpatient Medications:  .  albuterol (VENTOLIN HFA) 108 (90 Base) MCG/ACT inhaler, Inhale 2 puffs into the lungs every 4 (four) hours as needed for wheezing or shortness of breath., Disp: 1 Inhaler, Rfl: 0 .  ALPRAZolam (XANAX) 1 MG tablet, Take 1 tablet by mouth 2 (two) times daily., Disp: , Rfl: 2 .  atorvastatin (LIPITOR) 80 MG tablet, TAKE 1 TABLET BY MOUTH EVERYDAY AT BEDTIME, Disp: 90 tablet, Rfl: 1 .   budesonide-formoterol (SYMBICORT) 160-4.5 MCG/ACT inhaler, Inhale 2 puffs into the lungs 2 (two) times daily., Disp: 1 Inhaler, Rfl: 12 .  citalopram (CELEXA) 20 MG tablet, Take 20 mg by mouth daily., Disp: , Rfl:  .  fluticasone (FLONASE) 50 MCG/ACT nasal spray, Place 2 sprays into both nostrils daily., Disp: 16 g, Rfl: 6 .  Fluticasone-Umeclidin-Vilant (TRELEGY ELLIPTA) 200-62.5-25 MCG/INH AEPB, Inhale 1 puff into the lungs daily., Disp: 28 each, Rfl: 11 .  ipratropium-albuterol (DUONEB) 0.5-2.5 (3) MG/3ML SOLN, Take 3 mLs by nebulization every 6 (six) hours as needed (for coughing fits, wheeze, SOB)., Disp: 180 mL, Rfl: 1 .  levocetirizine (XYZAL) 5 MG tablet, TAKE 1 TABLET BY MOUTH EVERY DAY IN THE EVENING, Disp: 90 tablet, Rfl: 3 .  levothyroxine (SYNTHROID) 50 MCG tablet, Take 1 tablet (50 mcg total) by mouth daily before breakfast. Take 1 tablet (50 mcg) po qac breakfast x 5 d a week and take 1.5 tablets (75 mcg) po qac breakfast 2x a week, Disp: 103 tablet, Rfl: 3 .  nystatin (NYSTATIN) powder, Apply topically to rash two to three times a day, Disp: 30 g, Rfl: 1 .  valsartan (DIOVAN) 80 MG tablet, Take 1 tablet (80 mg total) by mouth daily. For blood pressure, Disp: 90 tablet, Rfl: 3  Patient Active Problem List   Diagnosis Date Noted  . 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 Major depression, recurrent ([place]) 01/05/2017  . Abnormal grief reaction  01/05/2017  . Chronic pain syndrome 03/07/2016  . Vitamin D deficiency 11/10/2015  . Elevated C-reactive protein (CRP) 11/10/2015  . Arthralgia 10/20/2015  . Lumbar facet hypertrophy (Bilateral) 10/20/2015  . Lumbar facet syndrome (Location of Primary Source of Pain) (Right) 10/20/2015  . Lumbar spondylosis 10/20/2015  . Osteopenia 10/20/2015  . Chronic shoulder pain (Right) 10/18/2015  . Chronic low back pain (Location of Primary Source of Pain) (Right) 10/18/2015  . Chronic lower extremity pain (Location  of Secondary source of pain) (Right) 10/18/2015  . Occipital neuralgia (Right) 10/18/2015  . Cervicogenic headache 10/18/2015  . Drug-seeking behavior 10/06/2015  . Type 2 diabetes, controlled, with neuropathy ([place]) 09/30/2014  . Chronic neck pain (Location of Tertiary source of pain) (Right) 09/30/2014  . Cervical disc disorder with radiculopathy of cervical region 09/30/2014  . Gout 08/07/2014  . Hyperlipidemia 08/07/2014  . Hypothyroidism 08/07/2014  . Congenital scoliosis 08/07/2014  . COPD (chronic obstructive pulmonary disease) ([place])   . Hypertension     Past Surgical History:  Procedure Laterality Date  . ABDOMINAL HYSTERECTOMY  2003   one ovary remains: due to heavy bleeding/endometrioma/cysts  . BACK SURGERY     and injections  . CARPAL TUNNEL RELEASE    . FOOT SURGERY Right    bone spur  . POLYPECTOMY     uterine    Family History  Problem Relation Age of Onset  . Alzheimer's disease Mother   . Dementia Mother   . Cancer Mother        colon  . Heart disease Mother   . Hypertension Mother   . Depression Mother   . Alzheimer's disease Father   . Dementia Father   . Hypertension Father   . Cancer Brother        lung  . Lung disease Brother   . Hypertension Sister   . Pneumonia Brother   . Heart failure Brother     Social History   Tobacco Use  . Smoking status: Former Smoker    Packs/day: 2.00    Years: 5.00    Pack years: 10.00    Types: Cigarettes    Quit date: 02/02/1991    Years since quitting: 28.6  . Smokeless tobacco: Never Used  Vaping Use  . Vaping Use: Never used  Substance Use Topics  . Alcohol use: No    Alcohol/week: 0.0 standard drinks  . Drug use: No     Allergies  Allergen Reactions  . Contrast Media [Iodinated Diagnostic Agents] Shortness Of Breath  . Aspirin Nausea And Vomiting  . Macrobid [Nitrofurantoin Monohyd Macro] Hives and Nausea Only    Health Maintenance  Topic Date Due  . DEXA SCAN  Never done  . MAMMOGRAM   01/16/2013  . COLONOSCOPY  01/16/2017  . OPHTHALMOLOGY EXAM  10/03/2018  . INFLUENZA VACCINE  08/17/2019  . Hepatitis C Screening  06/16/2020 (Originally [birth year])  . HIV Screening  06/16/2020 (Originally 07/18/1975)  . TETANUS/TDAP  11/11/2019  . HEMOGLOBIN A1C  12/17/2019  . FOOT EXAM  06/16/2020  . PNEUMOCOCCAL POLYSACCHARIDE VACCINE AGE 17-64 HIGH RISK  Completed  . COVID-19 Vaccine  Completed  . PAP SMEAR-Modifier  Discontinued    Chart Review Today: ***  Review of Systems   Objective:   There were no vitals filed for this visit.  There is no height or weight on file to calculate BMI.  Physical Exam      Assessment & Plan:   ***  No follow-ups on file.  Cathrine Muster, CMA 09/26/19 4:14 PM

## 2019-09-29 ENCOUNTER — Ambulatory Visit: Payer: Medicare Other | Admitting: Family Medicine

## 2019-10-13 ENCOUNTER — Other Ambulatory Visit: Payer: Self-pay | Admitting: Family Medicine

## 2019-10-13 DIAGNOSIS — Z1231 Encounter for screening mammogram for malignant neoplasm of breast: Secondary | ICD-10-CM

## 2019-11-07 ENCOUNTER — Ambulatory Visit
Admission: RE | Admit: 2019-11-07 | Discharge: 2019-11-07 | Disposition: A | Discharge status: Home / Self Care | Payer: Medicare Other | Source: Ambulatory Visit | Attending: Family Medicine | Admitting: Family Medicine

## 2019-11-07 ENCOUNTER — Other Ambulatory Visit: Payer: Self-pay

## 2019-11-07 DIAGNOSIS — Z1231 Encounter for screening mammogram for malignant neoplasm of breast: Secondary | ICD-10-CM

## 2019-11-10 ENCOUNTER — Other Ambulatory Visit: Payer: Self-pay | Admitting: Family Medicine

## 2019-11-10 DIAGNOSIS — N6320 Unspecified lump in the left breast, unspecified quadrant: Secondary | ICD-10-CM

## 2019-11-10 DIAGNOSIS — R928 Other abnormal and inconclusive findings on diagnostic imaging of breast: Secondary | ICD-10-CM

## 2019-11-18 ENCOUNTER — Other Ambulatory Visit: Payer: Self-pay

## 2019-11-18 ENCOUNTER — Telehealth (INDEPENDENT_AMBULATORY_CARE_PROVIDER_SITE_OTHER): Payer: Self-pay | Admitting: Gastroenterology

## 2019-11-18 DIAGNOSIS — Z800 Family history of malignant neoplasm of digestive organs: Secondary | ICD-10-CM

## 2019-11-18 DIAGNOSIS — Z1211 Encounter for screening for malignant neoplasm of colon: Secondary | ICD-10-CM

## 2019-11-18 MED ORDER — NA SULFATE-K SULFATE-MG SULF 17.5-3.13-1.6 GM/177ML PO SOLN
1.0000 | Freq: Once | ORAL | 0 refills | Status: AC
Start: 2019-11-18 — End: 2019-11-18

## 2019-11-18 NOTE — Progress Notes (Signed)
Gastroenterology Pre-Procedure Review  Request Date: 12/09/19 Requesting Physician: Dr. Allen Norris  PATIENT REVIEW QUESTIONS: The patient responded to the following health history questions as indicated:    1. Are you having any GI issues? no 2. Do you have a personal history of Polyps? no 3. Do you have a family history of Colon Cancer or Polyps? Yes mother colon cancer 4. Diabetes Mellitus? no patient states she no longer has diabetes controlled by diet  5. Joint replacements in the past 12 months?no 6. Major health problems in the past 3 months?no 7. Any artificial heart valves, MVP, or defibrillator?no    MEDICATIONS & ALLERGIES:    Patient reports the following regarding taking any anticoagulation/antiplatelet therapy:   Plavix, Coumadin, Eliquis, Xarelto, Lovenox, Pradaxa, Brilinta, or Effient? no Aspirin? no  Patient confirms/reports the following medications:  Current Outpatient Medications  Medication Sig Dispense Refill  . albuterol (VENTOLIN HFA) 108 (90 Base) MCG/ACT inhaler Inhale 2 puffs into the lungs every 4 (four) hours as needed for wheezing or shortness of breath. 1 Inhaler 0  . ALPRAZolam (XANAX) 1 MG tablet Take 1 tablet by mouth 2 (two) times daily.  2  . atorvastatin (LIPITOR) 80 MG tablet TAKE 1 TABLET BY MOUTH EVERYDAY AT BEDTIME 90 tablet 1  . budesonide-formoterol (SYMBICORT) 160-4.5 MCG/ACT inhaler Inhale 2 puffs into the lungs 2 (two) times daily. 1 Inhaler 12  . citalopram (CELEXA) 20 MG tablet Take 20 mg by mouth daily.    . fluticasone (FLONASE) 50 MCG/ACT nasal spray Place 2 sprays into both nostrils daily. 16 g 6  . Fluticasone-Umeclidin-Vilant (TRELEGY ELLIPTA) 200-62.5-25 MCG/INH AEPB Inhale 1 puff into the lungs daily. 28 each 11  . ipratropium-albuterol (DUONEB) 0.5-2.5 (3) MG/3ML SOLN Take 3 mLs by nebulization every 6 (six) hours as needed (for coughing fits, wheeze, SOB). 180 mL 1  . levocetirizine (XYZAL) 5 MG tablet TAKE 1 TABLET BY MOUTH EVERY DAY  IN THE EVENING 90 tablet 3  . levothyroxine (SYNTHROID) 50 MCG tablet Take 1 tablet (50 mcg total) by mouth daily before breakfast. Take 1 tablet (50 mcg) po qac breakfast x 5 d a week and take 1.5 tablets (75 mcg) po qac breakfast 2x a week 103 tablet 3  . nystatin (NYSTATIN) powder Apply topically to rash two to three times a day 30 g 1  . valsartan (DIOVAN) 80 MG tablet Take 1 tablet (80 mg total) by mouth daily. For blood pressure 90 tablet 3   No current facility-administered medications for this visit.    Patient confirms/reports the following allergies:  Allergies  Allergen Reactions  . Contrast Media [Iodinated Diagnostic Agents] Shortness Of Breath  . Aspirin Nausea And Vomiting  . Macrobid [Nitrofurantoin Monohyd Macro] Hives and Nausea Only    No orders of the defined types were placed in this encounter.   AUTHORIZATION INFORMATION Primary Insurance: 1D#: Group #:  Secondary Insurance: 1D#: Group #:  SCHEDULE INFORMATION: Date: 12/09/19 Time: Location:[facility]

## 2019-11-21 ENCOUNTER — Ambulatory Visit
Admission: RE | Admit: 2019-11-21 | Discharge: 2019-11-21 | Disposition: A | Discharge status: Home / Self Care | Payer: Medicare Other | Source: Ambulatory Visit | Attending: Family Medicine | Admitting: Family Medicine

## 2019-11-21 ENCOUNTER — Other Ambulatory Visit: Payer: Self-pay

## 2019-11-21 DIAGNOSIS — N6320 Unspecified lump in the left breast, unspecified quadrant: Secondary | ICD-10-CM

## 2019-11-21 DIAGNOSIS — R928 Other abnormal and inconclusive findings on diagnostic imaging of breast: Secondary | ICD-10-CM | POA: Diagnosis not present

## 2019-12-05 ENCOUNTER — Other Ambulatory Visit: Payer: Self-pay

## 2019-12-05 ENCOUNTER — Other Ambulatory Visit
Admission: RE | Admit: 2019-12-05 | Discharge: 2019-12-05 | Disposition: A | Discharge status: Home / Self Care | Payer: Medicare Other | Source: Ambulatory Visit | Attending: Gastroenterology | Admitting: Gastroenterology

## 2019-12-05 DIAGNOSIS — Z01812 Encounter for preprocedural laboratory examination: Secondary | ICD-10-CM | POA: Insufficient documentation

## 2019-12-05 DIAGNOSIS — Z20822 Contact with and (suspected) exposure to covid-19: Secondary | ICD-10-CM | POA: Diagnosis not present

## 2019-12-05 LAB — SARS CORONAVIRUS 2 (TAT 6-24 HRS): SARS Coronavirus 2: NEGATIVE

## 2019-12-08 ENCOUNTER — Encounter: Payer: Self-pay | Admitting: Gastroenterology

## 2019-12-09 ENCOUNTER — Other Ambulatory Visit: Payer: Self-pay

## 2019-12-09 ENCOUNTER — Encounter
Admission: RE | Disposition: A | Discharge status: Home / Self Care | Payer: Self-pay | Source: Home / Self Care | Attending: Gastroenterology

## 2019-12-09 ENCOUNTER — Ambulatory Visit: Payer: Medicare Other | Admitting: Registered Nurse

## 2019-12-09 ENCOUNTER — Ambulatory Visit
Admission: RE | Admit: 2019-12-09 | Discharge: 2019-12-09 | Disposition: A | Discharge status: Home / Self Care | Payer: Medicare Other | Attending: Gastroenterology | Admitting: Gastroenterology

## 2019-12-09 DIAGNOSIS — D123 Benign neoplasm of transverse colon: Secondary | ICD-10-CM | POA: Diagnosis not present

## 2019-12-09 DIAGNOSIS — Z1211 Encounter for screening for malignant neoplasm of colon: Secondary | ICD-10-CM | POA: Diagnosis not present

## 2019-12-09 DIAGNOSIS — Z7951 Long term (current) use of inhaled steroids: Secondary | ICD-10-CM | POA: Diagnosis not present

## 2019-12-09 DIAGNOSIS — Z8249 Family history of ischemic heart disease and other diseases of the circulatory system: Secondary | ICD-10-CM | POA: Insufficient documentation

## 2019-12-09 DIAGNOSIS — D124 Benign neoplasm of descending colon: Secondary | ICD-10-CM | POA: Insufficient documentation

## 2019-12-09 DIAGNOSIS — Z87891 Personal history of nicotine dependence: Secondary | ICD-10-CM | POA: Insufficient documentation

## 2019-12-09 DIAGNOSIS — Z800 Family history of malignant neoplasm of digestive organs: Secondary | ICD-10-CM | POA: Insufficient documentation

## 2019-12-09 DIAGNOSIS — M069 Rheumatoid arthritis, unspecified: Secondary | ICD-10-CM | POA: Insufficient documentation

## 2019-12-09 DIAGNOSIS — F319 Bipolar disorder, unspecified: Secondary | ICD-10-CM | POA: Diagnosis not present

## 2019-12-09 DIAGNOSIS — D122 Benign neoplasm of ascending colon: Secondary | ICD-10-CM | POA: Insufficient documentation

## 2019-12-09 DIAGNOSIS — Z91041 Radiographic dye allergy status: Secondary | ICD-10-CM | POA: Diagnosis not present

## 2019-12-09 DIAGNOSIS — Z886 Allergy status to analgesic agent status: Secondary | ICD-10-CM | POA: Insufficient documentation

## 2019-12-09 DIAGNOSIS — Z820 Family history of epilepsy and other diseases of the nervous system: Secondary | ICD-10-CM | POA: Insufficient documentation

## 2019-12-09 DIAGNOSIS — Z801 Family history of malignant neoplasm of trachea, bronchus and lung: Secondary | ICD-10-CM | POA: Diagnosis not present

## 2019-12-09 DIAGNOSIS — Z818 Family history of other mental and behavioral disorders: Secondary | ICD-10-CM | POA: Insufficient documentation

## 2019-12-09 DIAGNOSIS — Z79899 Other long term (current) drug therapy: Secondary | ICD-10-CM | POA: Insufficient documentation

## 2019-12-09 DIAGNOSIS — E039 Hypothyroidism, unspecified: Secondary | ICD-10-CM | POA: Insufficient documentation

## 2019-12-09 DIAGNOSIS — Z7982 Long term (current) use of aspirin: Secondary | ICD-10-CM | POA: Insufficient documentation

## 2019-12-09 DIAGNOSIS — K5730 Diverticulosis of large intestine without perforation or abscess without bleeding: Secondary | ICD-10-CM | POA: Insufficient documentation

## 2019-12-09 DIAGNOSIS — J449 Chronic obstructive pulmonary disease, unspecified: Secondary | ICD-10-CM | POA: Insufficient documentation

## 2019-12-09 DIAGNOSIS — I10 Essential (primary) hypertension: Secondary | ICD-10-CM | POA: Diagnosis not present

## 2019-12-09 DIAGNOSIS — E119 Type 2 diabetes mellitus without complications: Secondary | ICD-10-CM | POA: Insufficient documentation

## 2019-12-09 DIAGNOSIS — Z836 Family history of other diseases of the respiratory system: Secondary | ICD-10-CM | POA: Diagnosis not present

## 2019-12-09 DIAGNOSIS — Z881 Allergy status to other antibiotic agents status: Secondary | ICD-10-CM | POA: Insufficient documentation

## 2019-12-09 DIAGNOSIS — K635 Polyp of colon: Secondary | ICD-10-CM

## 2019-12-09 HISTORY — PX: COLONOSCOPY WITH PROPOFOL: SHX5780

## 2019-12-09 LAB — GLUCOSE, CAPILLARY: Glucose-Capillary: 86 mg/dL (ref 70–99)

## 2019-12-09 SURGERY — COLONOSCOPY WITH PROPOFOL
Anesthesia: General

## 2019-12-09 MED ORDER — LIDOCAINE HCL (CARDIAC) PF 100 MG/5ML IV SOSY
PREFILLED_SYRINGE | INTRAVENOUS | Status: DC | PRN
  Administered 2019-12-09: 60 mg via INTRAVENOUS

## 2019-12-09 MED ORDER — EPHEDRINE SULFATE 50 MG/ML IJ SOLN
INTRAMUSCULAR | Status: DC | PRN
  Administered 2019-12-09: 10 mg via INTRAVENOUS

## 2019-12-09 MED ORDER — PROPOFOL 10 MG/ML IV BOLUS
INTRAVENOUS | Status: DC | PRN
  Administered 2019-12-09: 60 mg via INTRAVENOUS

## 2019-12-09 MED ORDER — PROPOFOL 500 MG/50ML IV EMUL
INTRAVENOUS | Status: DC | PRN
  Administered 2019-12-09: 150 ug/kg/min via INTRAVENOUS

## 2019-12-09 MED ORDER — SODIUM CHLORIDE 0.9 % IV SOLN
INTRAVENOUS | Status: DC
  Administered 2019-12-09: 20 mL/h via INTRAVENOUS

## 2019-12-09 NOTE — Transfer of Care (Signed)
Immediate Anesthesia Transfer of Care Note  Patient: Deborah Blackwell  Procedure(s) Performed: COLONOSCOPY WITH PROPOFOL (N/A )  Patient Location: Endoscopy Unit  Anesthesia Type:General  Level of Consciousness: drowsy  Airway & Oxygen Therapy: Patient Spontanous Breathing  Post-op Assessment: Report given to RN and Post -op Vital signs reviewed and stable  Post vital signs: Reviewed and stable  Last Vitals:  Vitals Value Taken Time  BP 86/56 12/09/19 1031  Temp 36.7 C 12/09/19 1032  Pulse 74 12/09/19 1033  Resp 16 12/09/19 1033  SpO2 93 % 12/09/19 1033  Vitals shown include unvalidated device data.  Last Pain:  Vitals:   12/09/19 1032  TempSrc: Temporal  PainSc:          Complications: No complications documented.

## 2019-12-09 NOTE — Anesthesia Preprocedure Evaluation (Signed)
Anesthesia Evaluation  Patient identified by MRN, date of birth, ID band Patient awake    Reviewed: Allergy & Precautions, NPO status , Patient's Chart, lab work & pertinent test results  History of Anesthesia Complications Negative for: history of anesthetic complications  Airway Mallampati: II  TM Distance: >3 FB Neck ROM: Full    Dental  (+) Poor Dentition   Pulmonary asthma , COPD,  COPD inhaler, former smoker,    breath sounds clear to auscultation- rhonchi (-) wheezing      Cardiovascular hypertension, Pt. on medications (-) CAD, (-) Past MI, (-) Cardiac Stents and (-) CABG  Rhythm:Regular Rate:Normal - Systolic murmurs and - Diastolic murmurs    Neuro/Psych  Headaches, neg Seizures PSYCHIATRIC DISORDERS Anxiety Depression Bipolar Disorder    GI/Hepatic Neg liver ROS, GERD  ,  Endo/Other  diabetes (diet controlled)Hypothyroidism   Renal/GU negative Renal ROS     Musculoskeletal  (+) Arthritis ,   Abdominal (+) - obese,   Peds  Hematology negative hematology ROS (+)   Anesthesia Other Findings Past Medical History: No date: Angina at rest [facility]) No date: Arthritis No date: Asthma 09/30/2014: Back pain with right-sided sciatica No date: Bipolar disorder (HCC) No date: Congenital scoliosis No date: COPD (chronic obstructive pulmonary disease) (HCC) No date: DDD (degenerative disc disease), lumbar No date: Depression No date: Diabetes mellitus without complication (HCC) No date: GERD (gastroesophageal reflux disease) No date: Gout No date: History of kidney stones No date: History of scoliosis     Comment:  was casted as a child No date: Hyperlipidemia No date: Hypertension 09/30/2014: Lumbar radiculopathy No date: Migraines No date: Panic attack No date: RA (rheumatoid arthritis) (HCC)   Reproductive/Obstetrics                             Anesthesia Physical Anesthesia  Plan  ASA: III  Anesthesia Plan: General   Post-op Pain Management:    Induction: Intravenous  PONV Risk Score and Plan: 2 and Propofol infusion  Airway Management Planned: Natural Airway  Additional Equipment:   Intra-op Plan:   Post-operative Plan:   Informed Consent: I have reviewed the patients History and Physical, chart, labs and discussed the procedure including the risks, benefits and alternatives for the proposed anesthesia with the patient or authorized representative who has indicated his/her understanding and acceptance.     Dental advisory given  Plan Discussed with: CRNA and Anesthesiologist  Anesthesia Plan Comments:         Anesthesia Quick Evaluation

## 2019-12-09 NOTE — Anesthesia Postprocedure Evaluation (Signed)
Anesthesia Post Note  Patient: ORTHA METTS  Procedure(s) Performed: COLONOSCOPY WITH PROPOFOL (N/A )  Patient location during evaluation: Endoscopy Anesthesia Type: General Level of consciousness: awake and alert and oriented Pain management: pain level controlled Vital Signs Assessment: post-procedure vital signs reviewed and stable Respiratory status: spontaneous breathing, nonlabored ventilation and respiratory function stable Cardiovascular status: blood pressure returned to baseline and stable Postop Assessment: no signs of nausea or vomiting Anesthetic complications: no   No complications documented.   Last Vitals:  Vitals:   12/09/19 1050 12/09/19 1100  BP: 103/72 92/62  Pulse: 75 61  Resp: 17 19  Temp:    SpO2: 99% 93%    Last Pain:  Vitals:   12/09/19 1032  TempSrc: Temporal  PainSc:                  Aveer Bartow

## 2019-12-09 NOTE — H&P (Signed)
Deborah Lame, MD [address] [place], [place] [address] Phone: [phone] Fax : [phone]  Primary Care Physician:  Delsa Grana, PA-C Primary Gastroenterologist:  Dr. Allen Norris  Pre-Procedure History & Physical: HPI:  Deborah Blackwell is a 59 y.o. female is here for a screening colonoscopy.   Past Medical History:  Diagnosis Date  . Angina at rest [facility])   . Arthritis   . Asthma   . Back pain with right-sided sciatica 09/30/2014  . Bipolar disorder ([place])   . Congenital scoliosis   . COPD (chronic obstructive pulmonary disease) ([place])   . DDD (degenerative disc disease), lumbar   . Depression   . Diabetes mellitus without complication ([place])   . GERD (gastroesophageal reflux disease)   . Gout   . History of kidney stones   . History of scoliosis    was casted as a child  . Hyperlipidemia   . Hypertension   . Lumbar radiculopathy 09/30/2014  . Migraines   . Panic attack   . RA (rheumatoid arthritis) ([place])     Past Surgical History:  Procedure Laterality Date  . ABDOMINAL HYSTERECTOMY  2003   one ovary remains: due to heavy bleeding/endometrioma/cysts  . BACK SURGERY     and injections  . CARPAL TUNNEL RELEASE    . FOOT SURGERY Right    bone spur  . POLYPECTOMY     uterine    Prior to Admission medications   Medication Sig Start Date End Date Taking? Authorizing Provider  albuterol (VENTOLIN HFA) 108 (90 Base) MCG/ACT inhaler Inhale 2 puffs into the lungs every 4 (four) hours as needed for wheezing or shortness of breath. 01/02/17  Yes Lada, Satira Anis, MD  ALPRAZolam Duanne Moron) 1 MG tablet Take 1 tablet by mouth 2 (two) times daily. 09/11/17  Yes [provider]  atorvastatin (LIPITOR) 80 MG tablet TAKE 1 TABLET BY MOUTH EVERYDAY AT BEDTIME 12/25/18  Yes Delsa Grana, PA-C  budesonide-formoterol (SYMBICORT) 160-4.5 MCG/ACT inhaler Inhale 2 puffs into the lungs 2 (two) times daily. 10/31/18  Yes Delsa Grana, PA-C  citalopram (CELEXA) 20 MG tablet  Take 20 mg by mouth daily. 03/25/19  Yes [provider]  fluticasone (FLONASE) 50 MCG/ACT nasal spray Place 2 sprays into both nostrils daily. 06/20/19  Yes Delsa Grana, PA-C  Fluticasone-Umeclidin-Vilant (TRELEGY ELLIPTA) 200-62.5-25 MCG/INH AEPB Inhale 1 puff into the lungs daily. 06/20/19  Yes Delsa Grana, PA-C  ipratropium-albuterol (DUONEB) 0.5-2.5 (3) MG/3ML SOLN Take 3 mLs by nebulization every 6 (six) hours as needed (for coughing fits, wheeze, SOB). 12/25/18  Yes Delsa Grana, PA-C  levocetirizine (XYZAL) 5 MG tablet TAKE 1 TABLET BY MOUTH EVERY DAY IN THE EVENING 06/20/19  Yes Delsa Grana, PA-C  levothyroxine (SYNTHROID) 50 MCG tablet Take 1 tablet (50 mcg total) by mouth daily before breakfast. Take 1 tablet (50 mcg) po qac breakfast x 5 d a week and take 1.5 tablets (75 mcg) po qac breakfast 2x a week 06/20/19  Yes Delsa Grana, PA-C  nystatin (NYSTATIN) powder Apply topically to rash two to three times a day 06/17/19  Yes Delsa Grana, PA-C  valsartan (DIOVAN) 80 MG tablet Take 1 tablet (80 mg total) by mouth daily. For blood pressure 06/20/19  Yes Delsa Grana, PA-C    Allergies as of 11/18/2019 - Review Complete 11/18/2019  Allergen Reaction Noted  . Contrast media [iodinated diagnostic agents] Shortness Of Breath 07/06/2014  . Aspirin Nausea And Vomiting 07/06/2014  . Macrobid [nitrofurantoin monohyd macro] Hives  and Nausea Only 03/01/2015    Family History  Problem Relation Age of Onset  . Alzheimer's disease Mother   . Dementia Mother   . Cancer Mother        colon  . Heart disease Mother   . Hypertension Mother   . Depression Mother   . Alzheimer's disease Father   . Dementia Father   . Hypertension Father   . Cancer Brother        lung  . Lung disease Brother   . Hypertension Sister   . Pneumonia Brother   . Heart failure Brother   . Breast cancer Neg Hx     Social History   Socioeconomic History  . Marital status: Widowed    Spouse name: Not on file  .  Number of children: 0  . Years of education: Not on file  . Highest education level: Associate degree: academic program  Occupational History  . Not on file  Tobacco Use  . Smoking status: Former Smoker    Packs/day: 2.00    Years: 5.00    Pack years: 10.00    Types: Cigarettes    Quit date: 02/02/1991    Years since quitting: 28.8  . Smokeless tobacco: Never Used  Vaping Use  . Vaping Use: Never used  Substance and Sexual Activity  . Alcohol use: No    Alcohol/week: 0.0 standard drinks  . Drug use: No  . Sexual activity: Not Currently  Other Topics Concern  . Not on file  Social History Narrative  . Not on file   Social Determinants of Health   Financial Resource Strain: Medium Risk  . Difficulty of Paying Living Expenses: Somewhat hard  Food Insecurity: No Food Insecurity  . Worried About Charity fundraiser in the Last Year: Never true  . Ran Out of Food in the Last Year: Never true  Transportation Needs: Unmet Transportation Needs  . Lack of Transportation (Medical): Yes  . Lack of Transportation (Non-Medical): No  Physical Activity: Insufficiently Active  . Days of Exercise per Week: 7 days  . Minutes of Exercise per Session: 20 min  Stress: Stress Concern Present  . Feeling of Stress : To some extent  Social Connections: Moderately Isolated  . Frequency of Communication with Friends and Family: More than three times a week  . Frequency of Social Gatherings with Friends and Family: Three times a week  . Attends Religious Services: More than 4 times per year  . Active Member of Clubs or Organizations: No  . Attends Archivist Meetings: Never  . Marital Status: Widowed  Intimate Partner Violence: Not At Risk  . Fear of Current or Ex-Partner: No  . Emotionally Abused: No  . Physically Abused: No  . Sexually Abused: No    Review of Systems: See HPI, otherwise negative ROS  Physical Exam: BP 126/89   Pulse 98   Temp (!) 96.8 F (36 C) (Temporal)    Resp 18   Ht 5' (1.524 m)   Wt 62.6 kg   SpO2 100%   BMI 26.95 kg/m  General:   Alert,  pleasant and cooperative in NAD Head:  Normocephalic and atraumatic. Neck:  Supple; no masses or thyromegaly. Lungs:  Clear throughout to auscultation.    Heart:  Regular rate and rhythm. Abdomen:  Soft, nontender and nondistended. Normal bowel sounds, without guarding, and without rebound.   Neurologic:  Alert and  oriented x4;  grossly normal neurologically.  Impression/Plan: Gwenlyn Perking  Wachter is now here to undergo a screening colonoscopy.  Risks, benefits, and alternatives regarding colonoscopy have been reviewed with the patient.  Questions have been answered.  All parties agreeable.

## 2019-12-09 NOTE — Op Note (Signed)
[facility]astroenterology Patient Name: Deborah Blackwell Procedure Date: 12/09/2019 10:03 AM MRN: [id number] Account #: [id number] Date of Birth: [birth year] Admit Type: Outpatient Age: 59 Room: [facility] ENDO ROOM 4 Gender: Female Note Status: Finalized Procedure:             Colonoscopy Indications:           Screening for colorectal malignant neoplasm Providers:             [name] MD, MD Medicines:             Propofol per Anesthesia Complications:         No immediate complications. Procedure:             Pre-Anesthesia Assessment:                        - Prior to the procedure, a History and Physical was                         performed, and patient medications and allergies were                         reviewed. The patient's tolerance of previous                         anesthesia was also reviewed. The risks and benefits                         of the procedure and the sedation options and risks                         were discussed with the patient. All questions were                         answered, and informed consent was obtained. Prior                         Anticoagulants: The patient has taken no previous                         anticoagulant or antiplatelet agents. ASA Grade                         Assessment: II - A patient with mild systemic disease.                         After reviewing the risks and benefits, the patient                         was deemed in satisfactory condition to undergo the                         procedure.                        After obtaining informed consent, the colonoscope was                         passed under direct vision. Throughout the procedure,  the patient's blood pressure, pulse, and oxygen                         saturations were monitored continuously. The                         Colonoscope was introduced through the anus and                         advanced to the the  cecum, identified by appendiceal                         orifice and ileocecal valve. The colonoscopy was                         performed without difficulty. The patient tolerated                         the procedure well. The quality of the bowel                         preparation was excellent. Findings:      The perianal and digital rectal examinations were normal.      A 5 mm polyp was found in the descending colon. The polyp was sessile.       The polyp was removed with a cold snare. Resection and retrieval were       complete.      A 5 mm polyp was found in the ascending colon. The polyp was sessile.       The polyp was removed with a cold snare. Resection and retrieval were       complete.      A 6 mm polyp was found in the transverse colon. The polyp was sessile.       The polyp was removed with a cold snare. Resection and retrieval were       complete.      Multiple small-mouthed diverticula were found in the sigmoid colon. Impression:            - One 5 mm polyp in the descending colon, removed with                         a cold snare. Resected and retrieved.                        - One 5 mm polyp in the ascending colon, removed with                         a cold snare. Resected and retrieved.                        - One 6 mm polyp in the transverse colon, removed with                         a cold snare. Resected and retrieved.                        - Diverticulosis in the sigmoid colon. Recommendation:        - Discharge  patient to home.                        - Resume previous diet.                        - Continue present medications.                        - Await pathology results.                        - Repeat colonoscopy in 5 years if polyp adenoma and                         10 years if hyperplastic Procedure Code(s):     --- Professional ---                        [phone], Colonoscopy, flexible; with removal of                         tumor(s), polyp(s),  or other lesion(s) by snare                         technique Diagnosis Code(s):     --- Professional ---                        Z12.11, Encounter for screening for malignant neoplasm                         of colon                        K63.5, Polyp of colon CPT copyright 2019 American [employer]. All rights reserved. The codes documented in this report are preliminary and upon coder review may  be revised to meet current compliance requirements. [name] MD, MD 12/09/2019 10:29:46 AM This report has been signed electronically. Number of Addenda: 0 Note Initiated On: 12/09/2019 10:03 AM Scope Withdrawal Time: 0 hours 5 minutes 58 seconds  Total Procedure Duration: 0 hours 14 minutes 32 seconds  Estimated Blood Loss:  Estimated blood loss: none. Estimated blood loss: none.      [facility]

## 2019-12-10 ENCOUNTER — Encounter: Payer: Self-pay | Admitting: Gastroenterology

## 2019-12-10 LAB — SURGICAL PATHOLOGY

## 2019-12-15 ENCOUNTER — Encounter: Payer: Self-pay | Admitting: Gastroenterology

## 2020-03-18 ENCOUNTER — Ambulatory Visit: Payer: Medicare Other | Admitting: Family Medicine

## 2020-03-30 ENCOUNTER — Ambulatory Visit (INDEPENDENT_AMBULATORY_CARE_PROVIDER_SITE_OTHER): Payer: Medicare Other

## 2020-03-30 ENCOUNTER — Other Ambulatory Visit: Payer: Self-pay

## 2020-03-30 VITALS — BP 108/70 | HR 80 | Temp 98.0°F | Resp 16 | Ht 61.0 in | Wt 142.0 lb

## 2020-03-30 DIAGNOSIS — Z0000 Encounter for general adult medical examination without abnormal findings: Secondary | ICD-10-CM | POA: Diagnosis not present

## 2020-03-30 NOTE — Patient Instructions (Signed)
Deborah Blackwell , Thank you for taking time to come for your Medicare Wellness Visit. I appreciate your ongoing commitment to your health goals. Please review the following plan we discussed and let me know if I can assist you in the future.   Screening recommendations/referrals: Colonoscopy: done 12/09/19 (repeat 2026) Mammogram: done 10/22 (repeat 6 months) Bone Density: Due Please call [phone] to schedule your mammogram and bone density screening  Recommended yearly ophthalmology/optometry visit for glaucoma screening and checkup Recommended yearly dental visit for hygiene and checkup  Vaccinations: Influenza vaccine: done 11/25/19 Pneumococcal vaccine: done 05/07/13 Tdap vaccine: Due Shingles vaccine: Due (contact pharmacy)  Covid-19: Done 04/18/19, 05/16/19, 12/09/2019  Advanced directives: Advance directive discussed with you today. Even though you declined this today please call our office should you change your mind and we can give you the proper paperwork for you to fill out.  Conditions/risks identified: Recommend healthy eating and physical activity for desired weight loss  Next appointment: Follow up in one year for your annual wellness visit.   Preventive Care 40-64 Years, Female Preventive care refers to lifestyle choices and visits with your health care provider that can promote health and wellness. What does preventive care include?  A yearly physical exam. This is also called an annual well check.  Dental exams once or twice a year.  Routine eye exams. Ask your health care provider how often you should have your eyes checked.  Personal lifestyle choices, including:  Daily care of your teeth and gums.  Regular physical activity.  Eating a healthy diet.  Avoiding tobacco and drug use.  Limiting alcohol use.  Practicing safe sex.  Taking low-dose aspirin daily starting at age 39.  Taking vitamin and mineral supplements as recommended by your health care  provider. What happens during an annual well check? The services and screenings done by your health care provider during your annual well check will depend on your age, overall health, lifestyle risk factors, and family history of disease. Counseling  Your health care provider may ask you questions about your:  Alcohol use.  Tobacco use.  Drug use.  Emotional well-being.  Home and relationship well-being.  Sexual activity.  Eating habits.  Work and work Statistician.  Method of birth control.  Menstrual cycle.  Pregnancy history. Screening  You may have the following tests or measurements:  Height, weight, and BMI.  Blood pressure.  Lipid and cholesterol levels. These may be checked every 5 years, or more frequently if you are over 32 years old.  Skin check.  Lung cancer screening. You may have this screening every year starting at age 80 if you have a 30-pack-year history of smoking and currently smoke or have quit within the past 15 years.  Fecal occult blood test (FOBT) of the stool. You may have this test every year starting at age 41.  Flexible sigmoidoscopy or colonoscopy. You may have a sigmoidoscopy every 5 years or a colonoscopy every 10 years starting at age 23.  Hepatitis C blood test.  Hepatitis B blood test.  Sexually transmitted disease (STD) testing.  Diabetes screening. This is done by checking your blood sugar (glucose) after you have not eaten for a while (fasting). You may have this done every 1-3 years.  Mammogram. This may be done every 1-2 years. Talk to your health care provider about when you should start having regular mammograms. This may depend on whether you have a family history of breast cancer.  BRCA-related cancer screening. This may  be done if you have a family history of breast, ovarian, tubal, or peritoneal cancers.  Pelvic exam and Pap test. This may be done every 3 years starting at age 61. Starting at age 20, this may be  done every 5 years if you have a Pap test in combination with an HPV test.  Bone density scan. This is done to screen for osteoporosis. You may have this scan if you are at high risk for osteoporosis. Discuss your test results, treatment options, and if necessary, the need for more tests with your health care provider. Vaccines  Your health care provider may recommend certain vaccines, such as:  Influenza vaccine. This is recommended every year.  Tetanus, diphtheria, and acellular pertussis (Tdap, Td) vaccine. You may need a Td booster every 10 years.  Zoster vaccine. You may need this after age 45.  Pneumococcal 13-valent conjugate (PCV13) vaccine. You may need this if you have certain conditions and were not previously vaccinated.  Pneumococcal polysaccharide (PPSV23) vaccine. You may need one or two doses if you smoke cigarettes or if you have certain conditions. Talk to your health care provider about which screenings and vaccines you need and how often you need them. This information is not intended to replace advice given to you by your health care provider. Make sure you discuss any questions you have with your health care provider. Document Released: 01/29/2015 Document Revised: 09/22/2015 Document Reviewed: 11/03/2014 Elsevier Interactive Patient Education  2017 [place] Prevention in the Home Falls can cause injuries. They can happen to people of all ages. There are many things you can do to make your home safe and to help prevent falls. What can I do on the outside of my home?  Regularly fix the edges of walkways and driveways and fix any cracks.  Remove anything that might make you trip as you walk through a door, such as a raised step or threshold.  Trim any bushes or trees on the path to your home.  Use bright outdoor lighting.  Clear any walking paths of anything that might make someone trip, such as rocks or tools.  Regularly check to see if  handrails are loose or broken. Make sure that both sides of any steps have handrails.  Any raised decks and porches should have guardrails on the edges.  Have any leaves, snow, or ice cleared regularly.  Use sand or salt on walking paths during winter.  Clean up any spills in your garage right away. This includes oil or grease spills. What can I do in the bathroom?  Use night lights.  Install grab bars by the toilet and in the tub and shower. Do not use towel bars as grab bars.  Use non-skid mats or decals in the tub or shower.  If you need to sit down in the shower, use a plastic, non-slip stool.  Keep the floor dry. Clean up any water that spills on the floor as soon as it happens.  Remove soap buildup in the tub or shower regularly.  Attach bath mats securely with double-sided non-slip rug tape.  Do not have throw rugs and other things on the floor that can make you trip. What can I do in the bedroom?  Use night lights.  Make sure that you have a light by your bed that is easy to reach.  Do not use any sheets or blankets that are too big for your bed. They should not hang down onto  the floor.  Have a firm chair that has side arms. You can use this for support while you get dressed.  Do not have throw rugs and other things on the floor that can make you trip. What can I do in the kitchen?  Clean up any spills right away.  Avoid walking on wet floors.  Keep items that you use a lot in easy-to-reach places.  If you need to reach something above you, use a strong step stool that has a grab bar.  Keep electrical cords out of the way.  Do not use floor polish or wax that makes floors slippery. If you must use wax, use non-skid floor wax.  Do not have throw rugs and other things on the floor that can make you trip. What can I do with my stairs?  Do not leave any items on the stairs.  Make sure that there are handrails on both sides of the stairs and use them. Fix  handrails that are broken or loose. Make sure that handrails are as long as the stairways.  Check any carpeting to make sure that it is firmly attached to the stairs. Fix any carpet that is loose or worn.  Avoid having throw rugs at the top or bottom of the stairs. If you do have throw rugs, attach them to the floor with carpet tape.  Make sure that you have a light switch at the top of the stairs and the bottom of the stairs. If you do not have them, ask someone to add them for you. What else can I do to help prevent falls?  Wear shoes that:  Do not have high heels.  Have rubber bottoms.  Are comfortable and fit you well.  Are closed at the toe. Do not wear sandals.  If you use a stepladder:  Make sure that it is fully opened. Do not climb a closed stepladder.  Make sure that both sides of the stepladder are locked into place.  Ask someone to hold it for you, if possible.  Clearly mark and make sure that you can see:  Any grab bars or handrails.  First and last steps.  Where the edge of each step is.  Use tools that help you move around (mobility aids) if they are needed. These include:  Canes.  Walkers.  Scooters.  Crutches.  Turn on the lights when you go into a dark area. Replace any light bulbs as soon as they burn out.  Set up your furniture so you have a clear path. Avoid moving your furniture around.  If any of your floors are uneven, fix them.  If there are any pets around you, be aware of where they are.  Review your medicines with your doctor. Some medicines can make you feel dizzy. This can increase your chance of falling. Ask your doctor what other things that you can do to help prevent falls. This information is not intended to replace advice given to you by your health care provider. Make sure you discuss any questions you have with your health care provider. Document Released: 10/29/2008 Document Revised: 06/10/2015 Document Reviewed:  02/06/2014 Elsevier Interactive Patient Education  2017 [employer].

## 2020-03-30 NOTE — Progress Notes (Signed)
Subjective:   Deborah Blackwell is a 60 y.o. female who presents for Medicare Annual (Subsequent) preventive examination.  Review of Systems     Cardiac Risk Factors include: diabetes mellitus;dyslipidemia;hypertension     Objective:    Today's Vitals   03/30/20 1040 03/30/20 1041  BP: 108/70   Pulse: 80   Resp: 16   Temp: 98 F (36.7 C)   TempSrc: Oral   SpO2: 98%   Weight: 142 lb (64.4 kg)   Height: 5\' 1"  (1.549 m)   PainSc:  4    Body mass index is 26.83 kg/m.  Advanced Directives 03/30/2020 12/09/2019 03/27/2019 03/22/2018 05/02/2016 10/18/2015 09/30/2015  Does Patient Have a Medical Advance Directive? No No No No No No No  Would patient like information on creating a medical advance directive? No - Patient declined - No - Patient declined No - Patient declined - No - patient declined information No - patient declined information  Some encounter information is confidential and restricted. Go to Review Flowsheets activity to see all data.    Current Medications (verified) Outpatient Encounter Medications as of 03/30/2020  Medication Sig  . albuterol (VENTOLIN HFA) 108 (90 Base) MCG/ACT inhaler Inhale 2 puffs into the lungs every 4 (four) hours as needed for wheezing or shortness of breath.  . ALPRAZolam (XANAX) 1 MG tablet Take 1 tablet by mouth 2 (two) times daily.  Marland Kitchen atorvastatin (LIPITOR) 80 MG tablet TAKE 1 TABLET BY MOUTH EVERYDAY AT BEDTIME  . budesonide-formoterol (SYMBICORT) 160-4.5 MCG/ACT inhaler Inhale 2 puffs into the lungs 2 (two) times daily.  . citalopram (CELEXA) 20 MG tablet Take 20 mg by mouth daily.  . fluticasone (FLONASE) 50 MCG/ACT nasal spray Place 2 sprays into both nostrils daily.  . Fluticasone-Umeclidin-Vilant (TRELEGY ELLIPTA) 200-62.5-25 MCG/INH AEPB Inhale 1 puff into the lungs daily.  Marland Kitchen ipratropium-albuterol (DUONEB) 0.5-2.5 (3) MG/3ML SOLN Take 3 mLs by nebulization every 6 (six) hours as needed (for coughing fits, wheeze, SOB).  Marland Kitchen levocetirizine  (XYZAL) 5 MG tablet TAKE 1 TABLET BY MOUTH EVERY DAY IN THE EVENING  . levothyroxine (SYNTHROID) 50 MCG tablet Take 1 tablet (50 mcg total) by mouth daily before breakfast. Take 1 tablet (50 mcg) po qac breakfast x 5 d a week and take 1.5 tablets (75 mcg) po qac breakfast 2x a week  . nystatin (NYSTATIN) powder Apply topically to rash two to three times a day  . valsartan (DIOVAN) 80 MG tablet Take 1 tablet (80 mg total) by mouth daily. For blood pressure   No facility-administered encounter medications on file as of 03/30/2020.    Allergies (verified) Contrast media [iodinated diagnostic agents], Aspirin, and Macrobid [nitrofurantoin monohyd macro]   History: Past Medical History:  Diagnosis Date  . Angina at rest [facility])   . Arthritis   . Asthma   . Back pain with right-sided sciatica 09/30/2014  . Bipolar disorder ([place])   . Congenital scoliosis   . COPD (chronic obstructive pulmonary disease) ([place])   . DDD (degenerative disc disease), lumbar   . Depression   . Diabetes mellitus without complication ([place])   . GERD (gastroesophageal reflux disease)   . Gout   . History of kidney stones   . History of scoliosis    was casted as a child  . Hyperlipidemia   . Hypertension   . Lumbar radiculopathy 09/30/2014  . Migraines   . Panic attack   . RA (rheumatoid arthritis) ([place])    Past Surgical History:  Procedure  Laterality Date  . ABDOMINAL HYSTERECTOMY  2003   one ovary remains: due to heavy bleeding/endometrioma/cysts  . BACK SURGERY     and injections  . CARPAL TUNNEL RELEASE    . COLONOSCOPY WITH PROPOFOL N/A 12/09/2019   Procedure: COLONOSCOPY WITH PROPOFOL;  Surgeon: [name], MD;  Location: [facility] ENDOSCOPY;  Service: Endoscopy;  Laterality: N/A;  . FOOT SURGERY Right    bone spur  . POLYPECTOMY     uterine   Family History  Problem Relation Age of Onset  . Alzheimer's disease Mother   . Dementia Mother   . Cancer Mother        colon  . Heart disease Mother   .  Hypertension Mother   . Depression Mother   . Alzheimer's disease Father   . Dementia Father   . Hypertension Father   . Cancer Brother        lung  . Lung disease Brother   . Hypertension Sister   . Pneumonia Brother   . Heart failure Brother   . Breast cancer Neg Hx    Social History   Socioeconomic History  . Marital status: Widowed    Spouse name: Not on file  . Number of children: 0  . Years of education: Not on file  . Highest education level: Associate degree: academic program  Occupational History  . Not on file  Tobacco Use  . Smoking status: Former Smoker    Packs/day: 2.00    Years: 5.00    Pack years: 10.00    Types: Cigarettes    Quit date: 02/02/1991    Years since quitting: 29.1  . Smokeless tobacco: Never Used  Vaping Use  . Vaping Use: Never used  Substance and Sexual Activity  . Alcohol use: No    Alcohol/week: 0.0 standard drinks  . Drug use: No  . Sexual activity: Not Currently  Other Topics Concern  . Not on file  Social History Narrative   Lives alone   Social Determinants of Health   Financial Resource Strain: Low Risk   . Difficulty of Paying Living Expenses: Not very hard  Food Insecurity: No Food Insecurity  . Worried About Charity fundraiser in the Last Year: Never true  . Ran Out of Food in the Last Year: Never true  Transportation Needs: No Transportation Needs  . Lack of Transportation (Medical): No  . Lack of Transportation (Non-Medical): No  Physical Activity: Sufficiently Active  . Days of Exercise per Week: 7 days  . Minutes of Exercise per Session: 30 min  Stress: No Stress Concern Present  . Feeling of Stress : Only a little  Social Connections: Moderately Isolated  . Frequency of Communication with Friends and Family: Once a week  . Frequency of Social Gatherings with Friends and Family: Once a week  . Attends Religious Services: More than 4 times per year  . Active Member of Clubs or Organizations: Yes  . Attends  Archivist Meetings: More than 4 times per year  . Marital Status: Widowed    Tobacco Counseling Counseling given: Not Answered   Clinical Intake:  Pre-visit preparation completed: Yes  Pain : 0-10 Pain Score: 4  Pain Type: Acute pain Pain Location: Breast Pain Orientation: Left,Lateral Pain Descriptors / Indicators: Aching,Sharp,Sore Pain Onset: 1 to 4 weeks ago Pain Frequency: Intermittent     BMI - recorded: 26.83 Nutritional Status: BMI 25 -29 Overweight Nutritional Risks: None Diabetes: No  How often do you  need to have someone help you when you read instructions, pamphlets, or other written materials from your doctor or pharmacy?: 1 - Never  Interpreter Needed?: No  Information entered by :: Amy Hopkins, LPN   Activities of Daily Living In your present state of health, do you have any difficulty performing the following activities: 03/30/2020 06/17/2019  Hearing? N N  Vision? N N  Difficulty concentrating or making decisions? N N  Walking or climbing stairs? N N  Dressing or bathing? N N  Doing errands, shopping? N N  Preparing Food and eating ? N -  Using the Toilet? N -  In the past six months, have you accidently leaked urine? Y -  Comment wears pads -  Do you have problems with loss of bowel control? N -  Managing your Medications? N -  Managing your Finances? N -  Housekeeping or managing your Housekeeping? N -  Some recent data might be hidden    Patient Care Team: Delsa Grana, PA-C as PCP - General (Family Medicine) Myer Haff, MD as Referring Physician (Psychiatry)  Indicate any recent Medical Services you may have received from other than [facility] providers in the past year (date may be approximate).     Assessment:   This is a routine wellness examination for Tameisha.  Hearing/Vision screen  Hearing Screening   125Hz  250Hz  500Hz  1000Hz  2000Hz  3000Hz  4000Hz  6000Hz  8000Hz   Right ear:           Left ear:           Comments: Declines  hearing difficulty  Vision Screening Comments: No longer seeing [place] - not accepting insurance - looking for another eye care provider - not seen in 3 yrs  Dietary issues and exercise activities discussed: Current Exercise Habits: Home exercise routine, Type of exercise: walking, Time (Minutes): 30, Frequency (Times/Week): 7, Weekly Exercise (Minutes/Week): 210, Intensity: Mild, Exercise limited by: None identified  Goals    . Weight (lb) < 200 lb (90.7 kg)     Patient would like to get down to about 125 lbs.      Depression Screen PHQ 2/9 Scores 03/30/2020 06/17/2019 03/27/2019 12/25/2018 10/31/2018 03/22/2018 01/14/2018  PHQ - 2 Score 3 4 2 1 2 2 1   PHQ- 9 Score 7 10 6 2 2 7 3   Exception Documentation - - - - - - -    Fall Risk Fall Risk  03/30/2020 06/17/2019 03/27/2019 12/25/2018 10/31/2018  Falls in the past year? 1 0 0 0 0  Comment - - - - -  Number falls in past yr: 0 0 0 0 0  Comment - - - - -  Injury with Fall? 0 0 0 0 0  Comment - - - - -  Risk for fall due to : No Fall Risks - No Fall Risks - -  Follow up Falls prevention discussed - Falls prevention discussed - -    FALL RISK PREVENTION PERTAINING TO THE HOME:  Any stairs in or around the home? No  If so, are there any without handrails? No  Home free of loose throw rugs in walkways, pet beds, electrical cords, etc? Yes Adequate lighting in your home to reduce risk of falls? Yes  ASSISTIVE DEVICES UTILIZED TO PREVENT FALLS:  Life alert? No  Use of a cane, walker or w/c? No  Grab bars in the bathroom? Yes  Shower chair or bench in shower? Yes  Elevated toilet seat or a handicapped toilet? No  TIMED UP AND GO:  Was the test performed? yes.  Length of time to ambulate 10 feet: 5 sec.   Gait steady and fast without use of assistive device  Cognitive Function: Normal cognitive status assessed by direct observation by this Nurse Health Advisor. No abnormalities found.       6CIT Screen 03/27/2019 03/22/2018   What Year? 0 points 0 points  What month? 0 points 0 points  What time? 0 points 0 points  Count back from 20 0 points 0 points  Months in reverse 0 points 0 points  Repeat phrase 0 points 0 points  Total Score 0 0    Immunizations Immunization History  Administered Date(s) Administered  . Influenza,inj,Quad PF,6+ Mos 09/30/2015, 10/01/2017, 10/27/2018, 11/25/2019  . Influenza-Unspecified 09/17/2014, 10/11/2016, 10/24/2018  . Moderna Sars-Covid-2 Vaccination 04/18/2019, 05/16/2019, 12/09/2019  . Pneumococcal Conjugate-13 05/07/2013  . Pneumococcal Polysaccharide-23 09/13/2011  . Tdap 11/10/2009    TDAP status: Due, Education has been provided regarding the importance of this vaccine. Advised may receive this vaccine at local pharmacy or Health Dept. Aware to provide a copy of the vaccination record if obtained from local pharmacy or Health Dept. Verbalized acceptance and understanding.  Flu Vaccine status: Up to date  Pneumococcal vaccine status: Up to date  Covid-19 vaccine status: Completed vaccines  Qualifies for Shingles Vaccine? Yes   Zostavax completed No   Shingrix Completed?: No.    Education has been provided regarding the importance of this vaccine. Patient has been advised to call insurance company to determine out of pocket expense if they have not yet received this vaccine. Advised may also receive vaccine at local pharmacy or Health Dept. Verbalized acceptance and understanding.  Screening Tests Health Maintenance  Topic Date Due  . DEXA SCAN  Never done  . Hepatitis C Screening  06/16/2020 (Originally 01-25-1960)  . HIV Screening  06/16/2020 (Originally 07/18/1975)  . TETANUS/TDAP  01/16/2021 (Originally 11/11/2019)  . MAMMOGRAM  11/06/2020  . COLONOSCOPY (Pts 45-74yrs Insurance coverage will need to be confirmed)  12/08/2024  . INFLUENZA VACCINE  Completed  . COVID-19 Vaccine  Completed  . HPV VACCINES  Aged Out  . PAP SMEAR-Modifier  Discontinued     Health Maintenance  Health Maintenance Due  Topic Date Due  . DEXA SCAN  Never done    Colorectal cancer screening: Type of screening: Colonoscopy. Completed 12/09/2019. Repeat every 5  years  Mammogram complete 11/08/2019 - repeat 6 months  Bone Density status: Completed 09/28/2009. Results reflect: Bone density results: NORMAL. Repeat every 2 after age 84 years.  Lung Cancer Screening: (Low Dose CT Chest recommended if Age 26-80 years, 30 pack-year currently smoking OR have quit w/in 15years.) does not qualify.    Additional Screening:  Hepatitis C Screening: does qualify; postponed  Vision Screening: Recommended annual ophthalmology exams for early detection of glaucoma and other disorders of the eye. Is the patient up to date with their annual eye exam?  No  Who is the provider or what is the name of the office in which the patient attends annual eye exams? Dr Gloriann Loan - patient needs to establish with new provider due to insurance  Dental Screening: Recommended annual dental exams for proper oral hygiene  Community Resource Referral / Chronic Care Management: CRR required this visit?  no  CCM required this visit?  No      Plan:     I have personally reviewed and noted the following in the patient's chart:   . Medical  and social history . Use of alcohol, tobacco or illicit drugs  . Current medications and supplements . Functional ability and status . Nutritional status . Physical activity . Advanced directives . List of other physicians . Hospitalizations, surgeries, and ER visits in previous 12 months . Vitals . Screenings to include cognitive, depression, and falls . Referrals and appointments  In addition, I have reviewed and discussed with patient certain preventive protocols, quality metrics, and best practice recommendations. A written personalized care plan for preventive services as well as general preventive health recommendations were provided to  patient.     Clemetine Marker, LPN   [id number]   Nurse Notes: Patient c/o left outer breast pain. Mammogram/ Ultrasound in Nov 2021 showed 2 lumps and recommended f/u mammo and u/s in 6 months. Patient scheduled for f/u with Carles Collet on 3/29 - advised to f/u sooner if needed.

## 2020-04-06 ENCOUNTER — Ambulatory Visit: Payer: Medicare Other | Admitting: Family Medicine

## 2020-04-09 ENCOUNTER — Ambulatory Visit: Payer: Medicare Other | Admitting: Family Medicine

## 2020-04-13 ENCOUNTER — Other Ambulatory Visit: Payer: Self-pay

## 2020-04-13 ENCOUNTER — Encounter: Payer: Self-pay | Admitting: Physician Assistant

## 2020-04-13 ENCOUNTER — Ambulatory Visit (INDEPENDENT_AMBULATORY_CARE_PROVIDER_SITE_OTHER): Payer: Medicare Other | Admitting: Physician Assistant

## 2020-04-13 VITALS — BP 116/74 | HR 85 | Temp 98.2°F | Resp 16 | Ht 61.0 in | Wt 143.9 lb

## 2020-04-13 DIAGNOSIS — I10 Essential (primary) hypertension: Secondary | ICD-10-CM | POA: Diagnosis not present

## 2020-04-13 DIAGNOSIS — Z23 Encounter for immunization: Secondary | ICD-10-CM

## 2020-04-13 DIAGNOSIS — J449 Chronic obstructive pulmonary disease, unspecified: Secondary | ICD-10-CM

## 2020-04-13 DIAGNOSIS — N644 Mastodynia: Secondary | ICD-10-CM

## 2020-04-13 DIAGNOSIS — E782 Mixed hyperlipidemia: Secondary | ICD-10-CM

## 2020-04-13 DIAGNOSIS — Z0000 Encounter for general adult medical examination without abnormal findings: Secondary | ICD-10-CM | POA: Diagnosis not present

## 2020-04-13 DIAGNOSIS — M8580 Other specified disorders of bone density and structure, unspecified site: Secondary | ICD-10-CM

## 2020-04-13 DIAGNOSIS — E039 Hypothyroidism, unspecified: Secondary | ICD-10-CM

## 2020-04-13 DIAGNOSIS — E1140 Type 2 diabetes mellitus with diabetic neuropathy, unspecified: Secondary | ICD-10-CM | POA: Diagnosis not present

## 2020-04-13 DIAGNOSIS — H60501 Unspecified acute noninfective otitis externa, right ear: Secondary | ICD-10-CM

## 2020-04-13 MED ORDER — VALSARTAN 80 MG PO TABS: 80.0000 mg | ORAL_TABLET | Freq: Every day | ORAL | 1 refills | Status: DC

## 2020-04-13 MED ORDER — TRELEGY ELLIPTA 200-62.5-25 MCG/INH IN AEPB: 1.0000 | INHALATION_SPRAY | Freq: Every day | RESPIRATORY_TRACT | 1 refills | Status: DC

## 2020-04-13 MED ORDER — ATORVASTATIN CALCIUM 80 MG PO TABS: ORAL_TABLET | ORAL | 1 refills | Status: DC

## 2020-04-13 MED ORDER — CORTISPORIN-TC 3.3-3-10-0.5 MG/ML OT SUSP: 3.0000 [drp] | Freq: Four times a day (QID) | OTIC | 0 refills | Status: DC

## 2020-04-13 NOTE — Progress Notes (Signed)
Established patient visit   Patient: Deborah Blackwell   DOB: 1960-06-09   60 y.o. Female  MRN: [id number] Visit Date: 04/13/2020  Today's healthcare provider: Trinna Post, PA-C   Chief Complaint  Patient presents with  . Hypertension  . Hyperlipidemia  . Follow-up  . Hypothyroidism  . Depression   Subjective    HPI   Diabetes Mellitus Type II, Follow-up  Lab Results  Component Value Date   HGBA1C 5.3 06/17/2019   HGBA1C 5.7 (H) 12/25/2018   HGBA1C 5.9 (H) 10/01/2017   Wt Readings from Last 3 Encounters:  04/13/20 143 lb 14.4 oz (65.3 kg)  03/30/20 142 lb (64.4 kg)  12/09/19 138 lb (62.6 kg)   Last seen for diabetes 9 months ago.  Management since then includes diet control. She reports good compliance with treatment. She is not having side effects.  Symptoms: No fatigue No foot ulcerations  No appetite changes No nausea  No paresthesia of the feet  No polydipsia  No polyuria No visual disturbances   No vomiting     Home blood sugar records: fasting range: not checked  Episodes of hypoglycemia? No    Current insulin regiment: none Most Recent Eye Exam: 2019 Current exercise: none Current diet habits: well balanced  Pertinent Labs: Lab Results  Component Value Date   CHOL 206 (H) 06/17/2019   HDL 30 (L) 06/17/2019   LDLCALC 154 (H) 06/17/2019   TRIG 106 06/17/2019   CHOLHDL 6.9 (H) 06/17/2019   Lab Results  Component Value Date   NA 139 06/17/2019   K 4.3 06/17/2019   CREATININE 0.86 06/17/2019   GFRNONAA 73 06/17/2019   GFRAA 85 06/17/2019   GLUCOSE 88 06/17/2019     --------------------------------------------------------------------------------------------------- Hypertension, follow-up  BP Readings from Last 3 Encounters:  04/13/20 116/74  03/30/20 108/70  12/09/19 92/62   Wt Readings from Last 3 Encounters:  04/13/20 143 lb 14.4 oz (65.3 kg)  03/30/20 142 lb (64.4 kg)  12/09/19 138 lb (62.6 kg)     She was last seen for  hypertension 9 months ago.  BP at that visit was controlled. Management since that visit includes valsartan 80 mg daily .  She reports excellent compliance with treatment. She is not having side effects.  She is following a Regular diet. She is not exercising. She does not smoke.  Use of agents associated with hypertension: none.   Outside blood pressures are not checked. Symptoms: No chest pain No chest pressure  No palpitations No syncope  No dyspnea No orthopnea  No paroxysmal nocturnal dyspnea No lower extremity edema   Pertinent labs: Lab Results  Component Value Date   CHOL 206 (H) 06/17/2019   HDL 30 (L) 06/17/2019   LDLCALC 154 (H) 06/17/2019   TRIG 106 06/17/2019   CHOLHDL 6.9 (H) 06/17/2019   Lab Results  Component Value Date   NA 139 06/17/2019   K 4.3 06/17/2019   CREATININE 0.86 06/17/2019   GFRNONAA 73 06/17/2019   GFRAA 85 06/17/2019   GLUCOSE 88 06/17/2019     The 10-year ASCVD risk score Mikey Bussing DC Jr., et al., 2013) is: 11.3%   --------------------------------------------------------------------------------------------------- Lipid/Cholesterol, Follow-up  Last lipid panel Other pertinent labs  Lab Results  Component Value Date   CHOL 206 (H) 06/17/2019   HDL 30 (L) 06/17/2019   LDLCALC 154 (H) 06/17/2019   TRIG 106 06/17/2019   CHOLHDL 6.9 (H) 06/17/2019   Lab Results  Component Value Date  ALT 18 06/17/2019   AST 19 06/17/2019   PLT 280 06/17/2019   TSH 2.34 06/17/2019     She was last seen for this 9 months ago.  Management since that visit includes continue lipitor 80 mg.  She reports fair compliance with treatment. She is not having side effects.   Symptoms: No chest pain No chest pressure/discomfort  No dyspnea No lower extremity edema  No numbness or tingling of extremity No orthopnea  No palpitations No paroxysmal nocturnal dyspnea  No speech difficulty No syncope   Current diet: well balanced Current exercise: none  The  10-year ASCVD risk score Mikey Bussing DC Jr., et al., 2013) is: 11.3%  --------------------------------------------------------------------------------------------------- Patient also reports her right ear has been itching for the past two months. She reports using some OTC ear drops without relief.   She also reports intermittent left breast pain. Had mammogram and [place] 11/2019 which showed complex but benign appearing cyst in left breast. Imaging due in May but she would like to proceed with imaging now.      Medications: Outpatient Medications Prior to Visit  Medication Sig  . albuterol (VENTOLIN HFA) 108 (90 Base) MCG/ACT inhaler Inhale 2 puffs into the lungs every 4 (four) hours as needed for wheezing or shortness of breath.  . ALPRAZolam (XANAX) 1 MG tablet Take 1 tablet by mouth 2 (two) times daily.  Marland Kitchen atorvastatin (LIPITOR) 80 MG tablet TAKE 1 TABLET BY MOUTH EVERYDAY AT BEDTIME  . citalopram (CELEXA) 20 MG tablet Take 20 mg by mouth daily.  . fluticasone (FLONASE) 50 MCG/ACT nasal spray Place 2 sprays into both nostrils daily.  . Fluticasone-Umeclidin-Vilant (TRELEGY ELLIPTA) 200-62.5-25 MCG/INH AEPB Inhale 1 puff into the lungs daily.  Marland Kitchen ipratropium-albuterol (DUONEB) 0.5-2.5 (3) MG/3ML SOLN Take 3 mLs by nebulization every 6 (six) hours as needed (for coughing fits, wheeze, SOB).  Marland Kitchen levocetirizine (XYZAL) 5 MG tablet TAKE 1 TABLET BY MOUTH EVERY DAY IN THE EVENING  . levothyroxine (SYNTHROID) 50 MCG tablet Take 1 tablet (50 mcg total) by mouth daily before breakfast. Take 1 tablet (50 mcg) po qac breakfast x 5 d a week and take 1.5 tablets (75 mcg) po qac breakfast 2x a week  . nystatin (NYSTATIN) powder Apply topically to rash two to three times a day  . valsartan (DIOVAN) 80 MG tablet Take 1 tablet (80 mg total) by mouth daily. For blood pressure  . budesonide-formoterol (SYMBICORT) 160-4.5 MCG/ACT inhaler Inhale 2 puffs into the lungs 2 (two) times daily. (Patient not taking: Reported on  04/13/2020)   No facility-administered medications prior to visit.    Review of Systems  All other systems reviewed and are negative.   {Labs  Heme  Chem  Endocrine  Serology  Results Review (optional):[id number]::" "}   Objective    BP 116/74   Pulse 85   Temp 98.2 F (36.8 C)   Resp 16   Ht 5\' 1"  (1.549 m)   Wt 143 lb 14.4 oz (65.3 kg)   SpO2 97%   BMI 27.19 kg/m     Physical Exam Constitutional:      Appearance: Normal appearance.  HENT:     Left Ear: Tympanic membrane and ear canal normal.     Ears:     Comments: Scaling and flaking right ear canal Cardiovascular:     Rate and Rhythm: Normal rate and regular rhythm.     Heart sounds: Normal heart sounds.  Pulmonary:     Effort: Pulmonary effort is normal.  Breath sounds: Normal breath sounds.  Skin:    General: Skin is warm and dry.  Neurological:     Mental Status: She is alert and oriented to person, place, and time. Mental status is at baseline.  Psychiatric:        Mood and Affect: Mood normal.        Behavior: Behavior normal.       No results found for any visits on 04/13/20.  Assessment & Plan    1. Annual physical exam   2. Hypothyroidism, unspecified type  Continue current dose, adjust pending labs.  - TSH  3. Type 2 diabetes, controlled, with neuropathy ([place])  - Comprehensive metabolic panel - CBC with Differential/Platelet - HgB A1c - Ambulatory referral to Ophthalmology  4. Mixed hyperlipidemia  Continue lipitor.   - Lipid panel - atorvastatin (LIPITOR) 80 MG tablet; TAKE 1 TABLET BY MOUTH EVERYDAY AT BEDTIME  Dispense: 90 tablet; Refill: 1  5. Primary hypertension  Continue medications.    6. Chronic obstructive pulmonary disease, unspecified COPD type ([place])  - Fluticasone-Umeclidin-Vilant (TRELEGY ELLIPTA) 200-62.5-25 MCG/INH AEPB; Inhale 1 puff into the lungs daily.  Dispense: 28 each; Refill: 1  7. Essential hypertension  - valsartan (DIOVAN) 80 MG tablet; Take  1 tablet (80 mg total) by mouth daily. For blood pressure  Dispense: 90 tablet; Refill: 1  8. Acute otitis externa of right ear, unspecified type  - neomycin-colistin-hydrocortisone-thonzonium (CORTISPORIN-TC) 3.03-18-08-0.5 MG/ML OTIC suspension; Place 3 drops into the right ear 4 (four) times daily.  Dispense: 10 mL; Refill: 0  9. Need for shingles vaccine   10. Osteopenia, unspecified location  - DG Bone Density; Future  11. Breast pain, left  - MM DIAG BREAST TOMO BILATERAL - US BREAST LTD UNI LEFT INC AXILLA - US BREAST LTD UNI RIGHT INC AXILLA   No follow-ups on file.         Trinna Post, PA-C  [facility] [phone] (phone) [phone] (fax)  [place]

## 2020-04-13 NOTE — Patient Instructions (Signed)

## 2020-04-14 ENCOUNTER — Telehealth: Payer: Self-pay | Admitting: Family Medicine

## 2020-04-14 LAB — CBC WITH DIFFERENTIAL/PLATELET
Absolute Monocytes: 532 cells/uL (ref 200–950)
Basophils Absolute: 30 cells/uL (ref 0–200)
Basophils Relative: 0.4 %
Eosinophils Absolute: 350 cells/uL (ref 15–500)
Eosinophils Relative: 4.6 %
HCT: 42 % (ref 35.0–45.0)
Hemoglobin: 13.8 g/dL (ref 11.7–15.5)
Lymphs Abs: 1490 cells/uL (ref 850–3900)
MCH: 28.7 pg (ref 27.0–33.0)
MCHC: 32.9 g/dL (ref 32.0–36.0)
MCV: 87.3 fL (ref 80.0–100.0)
MPV: 9.8 fL (ref 7.5–12.5)
Monocytes Relative: 7 %
Neutro Abs: 5198 cells/uL (ref 1500–7800)
Neutrophils Relative %: 68.4 %
Platelets: 267 10*3/uL (ref 140–400)
RBC: 4.81 10*6/uL (ref 3.80–5.10)
RDW: 12.3 % (ref 11.0–15.0)
Total Lymphocyte: 19.6 %
WBC: 7.6 10*3/uL (ref 3.8–10.8)

## 2020-04-14 LAB — COMPREHENSIVE METABOLIC PANEL
AG Ratio: 1.4 (calc) (ref 1.0–2.5)
ALT: 11 U/L (ref 6–29)
AST: 17 U/L (ref 10–35)
Albumin: 4.1 g/dL (ref 3.6–5.1)
Alkaline phosphatase (APISO): 84 U/L (ref 37–153)
BUN: 17 mg/dL (ref 7–25)
CO2: 30 mmol/L (ref 20–32)
Calcium: 9.2 mg/dL (ref 8.6–10.4)
Chloride: 103 mmol/L (ref 98–110)
Creat: 0.72 mg/dL (ref 0.50–0.99)
Globulin: 2.9 g/dL (calc) (ref 1.9–3.7)
Glucose, Bld: 87 mg/dL (ref 65–99)
Potassium: 4.5 mmol/L (ref 3.5–5.3)
Sodium: 140 mmol/L (ref 135–146)
Total Bilirubin: 0.6 mg/dL (ref 0.2–1.2)
Total Protein: 7 g/dL (ref 6.1–8.1)

## 2020-04-14 LAB — HEMOGLOBIN A1C
Hgb A1c MFr Bld: 5.5 % of total Hgb (ref <5.7)
Mean Plasma Glucose: 111 mg/dL
eAG (mmol/L): 6.2 mmol/L

## 2020-04-14 LAB — LIPID PANEL
Cholesterol: 184 mg/dL (ref <200)
HDL: 41 mg/dL — ABNORMAL LOW (ref >=50)
LDL Cholesterol (Calc): 125 mg/dL (calc) — ABNORMAL HIGH
Non-HDL Cholesterol (Calc): 143 mg/dL (calc) — ABNORMAL HIGH (ref <130)
Total CHOL/HDL Ratio: 4.5 (calc) (ref <5.0)
Triglycerides: 79 mg/dL (ref <150)

## 2020-04-14 LAB — TSH: TSH: 1.02 mIU/L (ref 0.40–4.50)

## 2020-04-14 NOTE — Telephone Encounter (Signed)
Pt called to advise the  neomycin-colistin-hydrocortisone-thonzonium (CORTISPORIN-TC) 3.03-18-08-0.5 MG/ML OTIC suspension  With her insurance is $100.  Called the pharmacy to see if she could use a good Rx card.  shawn the pharmacist states the dr called in Name Brand. That is why it is so expensive. But he said if you just call in the   [facility]  (leaving off the TC) It should be more affordable for the pt.  It will still have the steroid in it, but comes in generic.

## 2020-04-14 NOTE — Telephone Encounter (Signed)
Duplicate message. 

## 2020-04-15 ENCOUNTER — Telehealth: Payer: Self-pay | Admitting: Physician Assistant

## 2020-04-15 DIAGNOSIS — E782 Mixed hyperlipidemia: Secondary | ICD-10-CM

## 2020-04-15 MED ORDER — ROSUVASTATIN CALCIUM 40 MG PO TABS: 40.0000 mg | ORAL_TABLET | Freq: Every day | ORAL | 0 refills | Status: DC

## 2020-04-15 NOTE — Telephone Encounter (Signed)
Sent in crestor 40 mg QHS. This will replace lipitor nightly. Follow up routinely 3-6 months.

## 2020-04-16 NOTE — Telephone Encounter (Signed)
Patient notified of labs.   

## 2020-04-19 ENCOUNTER — Telehealth: Payer: Self-pay

## 2020-04-19 DIAGNOSIS — N6320 Unspecified lump in the left breast, unspecified quadrant: Secondary | ICD-10-CM

## 2020-04-19 NOTE — Telephone Encounter (Signed)
Reordered pt notified

## 2020-04-19 NOTE — Telephone Encounter (Signed)
Copied from [place] [id number]. Topic: General - Other >> Apr 19, 2020  2:02 PM Oneta Rack wrote: Reason for CRM:  patient states imaging orders should reflect left side breast and not both please re send to [facility]. Patient would like a follow up call when completed.

## 2020-05-24 ENCOUNTER — Ambulatory Visit
Admission: RE | Admit: 2020-05-24 | Discharge: 2020-05-24 | Disposition: A | Discharge status: Home / Self Care | Payer: Medicare Other | Source: Ambulatory Visit | Attending: Physician Assistant | Admitting: Physician Assistant

## 2020-05-24 ENCOUNTER — Ambulatory Visit
Admission: RE | Admit: 2020-05-24 | Discharge: 2020-05-24 | Disposition: A | Discharge status: Home / Self Care | Payer: Medicare Other | Source: Ambulatory Visit | Attending: Family Medicine | Admitting: Family Medicine

## 2020-05-24 ENCOUNTER — Other Ambulatory Visit: Payer: Self-pay

## 2020-05-24 DIAGNOSIS — N6320 Unspecified lump in the left breast, unspecified quadrant: Secondary | ICD-10-CM | POA: Insufficient documentation

## 2020-05-24 DIAGNOSIS — E559 Vitamin D deficiency, unspecified: Secondary | ICD-10-CM | POA: Diagnosis not present

## 2020-05-24 DIAGNOSIS — E119 Type 2 diabetes mellitus without complications: Secondary | ICD-10-CM | POA: Diagnosis not present

## 2020-05-24 DIAGNOSIS — M810 Age-related osteoporosis without current pathological fracture: Secondary | ICD-10-CM | POA: Insufficient documentation

## 2020-05-24 DIAGNOSIS — M8580 Other specified disorders of bone density and structure, unspecified site: Secondary | ICD-10-CM

## 2020-05-24 DIAGNOSIS — J449 Chronic obstructive pulmonary disease, unspecified: Secondary | ICD-10-CM | POA: Diagnosis not present

## 2020-05-24 DIAGNOSIS — E039 Hypothyroidism, unspecified: Secondary | ICD-10-CM | POA: Diagnosis not present

## 2020-05-24 DIAGNOSIS — Z13820 Encounter for screening for osteoporosis: Secondary | ICD-10-CM | POA: Diagnosis not present

## 2020-05-24 DIAGNOSIS — N644 Mastodynia: Secondary | ICD-10-CM | POA: Diagnosis present

## 2020-05-27 ENCOUNTER — Encounter: Payer: Self-pay | Admitting: Family Medicine

## 2020-05-27 DIAGNOSIS — M810 Age-related osteoporosis without current pathological fracture: Secondary | ICD-10-CM | POA: Insufficient documentation

## 2020-06-15 ENCOUNTER — Ambulatory Visit: Payer: Self-pay | Admitting: Family Medicine

## 2020-07-01 ENCOUNTER — Other Ambulatory Visit: Payer: Self-pay | Admitting: Family Medicine

## 2020-07-05 ENCOUNTER — Telehealth: Payer: Self-pay

## 2020-07-05 NOTE — Telephone Encounter (Signed)
Copied from [place] [id number]. Topic: Appointment Scheduling - Scheduling Inquiry for Clinic >> Jul 05, 2020  2:05 PM Valere Dross wrote: Reason for CRM: Pt was wanting to know if she could have her 07/14 appt for reviewing her dexa reults can be done on the 07/28 appt, that way she can have them together. Please advise

## 2020-07-06 NOTE — Telephone Encounter (Signed)
Pt has appt on 07-29-2020 with Lucio Edward and pt just wants to keep that one

## 2020-07-09 ENCOUNTER — Ambulatory Visit: Payer: Self-pay | Admitting: Family Medicine

## 2020-07-21 ENCOUNTER — Other Ambulatory Visit: Payer: Self-pay

## 2020-07-21 DIAGNOSIS — E782 Mixed hyperlipidemia: Secondary | ICD-10-CM

## 2020-07-21 MED ORDER — ROSUVASTATIN CALCIUM 40 MG PO TABS: 40.0000 mg | ORAL_TABLET | Freq: Every day | ORAL | 2 refills | Status: DC

## 2020-07-29 ENCOUNTER — Encounter: Payer: Self-pay | Admitting: Family Medicine

## 2020-07-29 ENCOUNTER — Ambulatory Visit (INDEPENDENT_AMBULATORY_CARE_PROVIDER_SITE_OTHER): Payer: Medicare Other | Admitting: Family Medicine

## 2020-07-29 ENCOUNTER — Telehealth: Payer: Self-pay

## 2020-07-29 ENCOUNTER — Other Ambulatory Visit: Payer: Self-pay

## 2020-07-29 VITALS — BP 120/76 | HR 76 | Temp 98.2°F | Resp 16 | Ht 61.0 in | Wt 141.9 lb

## 2020-07-29 DIAGNOSIS — E039 Hypothyroidism, unspecified: Secondary | ICD-10-CM

## 2020-07-29 DIAGNOSIS — E782 Mixed hyperlipidemia: Secondary | ICD-10-CM

## 2020-07-29 DIAGNOSIS — E559 Vitamin D deficiency, unspecified: Secondary | ICD-10-CM | POA: Diagnosis not present

## 2020-07-29 DIAGNOSIS — M816 Localized osteoporosis [Lequesne]: Secondary | ICD-10-CM

## 2020-07-29 DIAGNOSIS — J449 Chronic obstructive pulmonary disease, unspecified: Secondary | ICD-10-CM

## 2020-07-29 DIAGNOSIS — F331 Major depressive disorder, recurrent, moderate: Secondary | ICD-10-CM

## 2020-07-29 DIAGNOSIS — M4696 Unspecified inflammatory spondylopathy, lumbar region: Secondary | ICD-10-CM

## 2020-07-29 DIAGNOSIS — H608X1 Other otitis externa, right ear: Secondary | ICD-10-CM

## 2020-07-29 DIAGNOSIS — I10 Essential (primary) hypertension: Secondary | ICD-10-CM

## 2020-07-29 MED ORDER — TRIAMCINOLONE ACETONIDE 0.1 % EX OINT: 1.0000 "application " | TOPICAL_OINTMENT | Freq: Two times a day (BID) | CUTANEOUS | 2 refills | Status: AC | PRN

## 2020-07-29 MED ORDER — NEOMYCIN-POLYMYXIN-HC 3.5-10000-1 OT SOLN: 3.0000 [drp] | Freq: Four times a day (QID) | OTIC | 2 refills | Status: DC | PRN

## 2020-07-29 MED ORDER — ALENDRONATE SODIUM 70 MG PO TABS: 70.0000 mg | ORAL_TABLET | ORAL | 3 refills | Status: DC

## 2020-07-29 NOTE — Telephone Encounter (Signed)
Copied from Erin [id number]. Topic: General - Inquiry >> Jul 29, 2020  4:11 PM Loma Boston wrote: alendronate (FOSAMAX) 70 MG tablet 12 tablet 3 07/29/2020   Sig - Route: Take 1 tablet (70 mg total) by mouth every 7 (seven) days. Take with a full glass of water on an empty stomach. - Oral  Sent to pharmacy as: alendronate (FOSAMAX) 70 MG tablet  neomycin-polymyxin-hydrocortisone (CORTISPORIN) OTIC solution Medication Date: 07/29/2020 Department: [facility] Ordering/Authorizing: Delsa Grana, PA-C  Pt was seen today and given both scripts, the FOSAMAX is over $70 and pt can not afford, Pharmacy offered no other possibilities.  Cortisporin was over $30 and pt is not happy with that but will pay if no other alternative.

## 2020-07-29 NOTE — Progress Notes (Signed)
Name: Deborah Blackwell   MRN: [id number]    DOB: May 06, 1960   Date:07/29/2020       Progress Note  Chief Complaint  Patient presents with   bone density     review     Subjective:   Deborah Blackwell is a 60 y.o. female, presents to clinic for routine f/up  Bone density recently done showed osteoporosis in left neck of femur, she is not on supplements Chart review showed prior Vit D deficiency in 2018 and 2019 She is loosing weight and working out at [employer], walking, no recent falls and no fx, but she has been experiencing right knee pain and left hip pain since working out more  Right ear chronic dermatitis and recurrent otitits externa improved a little with ear drops Rx's but they cost over $100 (seen by other provider) ear still bothering her chronically, red, inflammed, itchy, worse when she itches, flaking/peeling  DM:   Pt managing DM with diet/exercise Denies: Polyuria, polydipsia, vision changes, neuropathy, hypoglycemia Recent pertinent labs: Lab Results  Component Value Date   HGBA1C 5.5 04/13/2020   HGBA1C 5.3 06/17/2019   HGBA1C 5.7 (H) 12/25/2018   Hypothyroid Lab Results  Component Value Date   TSH 1.02 04/13/2020  On 50 mcg dose of levothyroxine  HLD - on crestor 40 mg daily  HTN - on valsartan 80 mg, good compliance, no SE or concerns, BP at goal today  BP Readings from Last 3 Encounters:  07/29/20 120/76  04/13/20 116/74  03/30/20 108/70  Pt denies CP, SOB, exertional sx, LE edema, palpitation, Ha's, visual disturbances, lightheadedness, hypotension, syncope.  COPD on trelegy   Seasonal allergies/nasal allergies on xyzal and stopped flonase due to nose bleeds     Current Outpatient Medications:    albuterol (VENTOLIN HFA) 108 (90 Base) MCG/ACT inhaler, Inhale 2 puffs into the lungs every 4 (four) hours as needed for wheezing or shortness of breath., Disp: 1 Inhaler, Rfl: 0   ALPRAZolam (XANAX) 1 MG tablet, Take 1 tablet by mouth 2 (two) times  daily., Disp: , Rfl: 2   citalopram (CELEXA) 20 MG tablet, Take 20 mg by mouth daily., Disp: , Rfl:    fluticasone (FLONASE) 50 MCG/ACT nasal spray, Place 2 sprays into both nostrils daily., Disp: 16 g, Rfl: 6   Fluticasone-Umeclidin-Vilant (TRELEGY ELLIPTA) 200-62.5-25 MCG/INH AEPB, Inhale 1 puff into the lungs daily., Disp: 28 each, Rfl: 1   ipratropium-albuterol (DUONEB) 0.5-2.5 (3) MG/3ML SOLN, Take 3 mLs by nebulization every 6 (six) hours as needed (for coughing fits, wheeze, SOB)., Disp: 180 mL, Rfl: 1   levocetirizine (XYZAL) 5 MG tablet, TAKE 1 TABLET BY MOUTH EVERY DAY IN THE EVENING, Disp: 90 tablet, Rfl: 3   levothyroxine (SYNTHROID) 50 MCG tablet, TAKE 1 TABLET (50 MCG) BEFORE BREAKFAST X 5 DAYS A WEEK AND TAKE 1.5 TABLETS (75 MCG) BREAKFAST 2X A WEEK, Disp: 103 tablet, Rfl: 3   neomycin-colistin-hydrocortisone-thonzonium (CORTISPORIN-TC) 3.03-18-08-0.5 MG/ML OTIC suspension, Place 3 drops into the right ear 4 (four) times daily., Disp: 10 mL, Rfl: 0   neomycin-polymyxin-hydrocortisone (CORTISPORIN) OTIC solution, Place 3 drops into the right ear 4 (four) times daily as needed., Disp: 10 mL, Rfl: 2   nystatin (NYSTATIN) powder, Apply topically to rash two to three times a day, Disp: 30 g, Rfl: 1   rosuvastatin (CRESTOR) 40 MG tablet, Take 1 tablet (40 mg total) by mouth daily., Disp: 90 tablet, Rfl: 2   triamcinolone ointment (KENALOG) 0.1 %, Apply 1  application topically 2 (two) times daily as needed., Disp: 30 g, Rfl: 2   valsartan (DIOVAN) 80 MG tablet, Take 1 tablet (80 mg total) by mouth daily. 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   Major depression, recurrent ([place]) 01/05/2017   Abnormal grief reaction 01/05/2017   Chronic pain syndrome 03/07/2016    Vitamin D deficiency 11/10/2015   Elevated C-reactive protein (CRP) 11/10/2015   Arthralgia 10/20/2015   Lumbar facet hypertrophy (Bilateral) 10/20/2015   Lumbar facet syndrome (Location of Primary Source of Pain) (Right) 10/20/2015   Lumbar spondylosis 10/20/2015   Osteopenia 10/20/2015   Chronic shoulder pain (Right) 10/18/2015   Chronic low back pain (Location of Primary Source of Pain) (Right) 10/18/2015   Chronic lower extremity pain (Location of Secondary source of pain) (Right) 10/18/2015   Occipital neuralgia (Right) 10/18/2015   Cervicogenic headache 10/18/2015   Drug-seeking behavior 10/06/2015   Type 2 diabetes, controlled, with neuropathy ([place]) 09/30/2014   Chronic neck pain (Location of Tertiary source of pain) (Right) 09/30/2014   Cervical disc disorder with radiculopathy of cervical region 09/30/2014   Gout 08/07/2014   Hyperlipidemia 08/07/2014   Hypothyroidism 08/07/2014   Congenital scoliosis 08/07/2014   COPD (chronic obstructive pulmonary disease) ([place])    Hypertension     Past Surgical History:  Procedure Laterality Date   ABDOMINAL HYSTERECTOMY  2003   one ovary remains: due to heavy bleeding/endometrioma/cysts   BACK SURGERY     and injections   CARPAL TUNNEL RELEASE     COLONOSCOPY WITH PROPOFOL N/A 12/09/2019   Procedure: COLONOSCOPY WITH PROPOFOL;  Surgeon: [name], MD;  Location: [facility] ENDOSCOPY;  Service: Endoscopy;  Laterality: N/A;   FOOT SURGERY Right    bone spur   POLYPECTOMY     uterine    Family History  Problem Relation Age of Onset   Alzheimer's disease Mother    Dementia Mother    Cancer Mother        colon   Heart disease Mother    Hypertension Mother    Depression Mother    Alzheimer's disease Father    Dementia Father    Hypertension Father    Cancer Brother        lung   Lung disease Brother    Hypertension Sister    Pneumonia Brother    Heart failure Brother    Breast cancer Neg Hx     Social History    Tobacco Use   Smoking status: Former    Packs/day: 2.00    Years: 5.00    Pack years: 10.00    Types: Cigarettes    Quit date: 02/02/1991    Years since quitting: 29.5   Smokeless tobacco: Never  Vaping Use   Vaping Use: Never used  Substance Use Topics   Alcohol use: No    Alcohol/week: 0.0 standard drinks   Drug use: No     Allergies  Allergen Reactions   Contrast Media [Iodinated Diagnostic Agents] Shortness Of Breath   Aspirin Nausea And Vomiting   Macrobid [Nitrofurantoin Monohyd Macro] Hives and Nausea Only    Health Maintenance  Topic Date Due   HIV Screening  Never done   Hepatitis C Screening  Never done   Zoster Vaccines- Shingrix (1 of 2)  Never done   OPHTHALMOLOGY EXAM  10/03/2018   COVID-19 Vaccine (4 - Booster for Moderna series) 04/07/2020   TETANUS/TDAP  01/16/2021 (Originally 11/11/2019)   INFLUENZA VACCINE  08/16/2020   HEMOGLOBIN A1C  10/14/2020   MAMMOGRAM  11/06/2020   FOOT EXAM  04/13/2021   DEXA SCAN  05/25/2022   COLONOSCOPY (Pts 45-18yrs Insurance coverage will need to be confirmed)  12/08/2024   PNEUMOCOCCAL POLYSACCHARIDE VACCINE AGE 3-64 HIGH RISK  Completed   Pneumococcal Vaccine 65-74 Years old  Aged Out   HPV VACCINES  Aged Out   PAP SMEAR-Modifier  Discontinued    Chart Review Today: I personally reviewed active problem list, medication list, allergies, family history, social history, health maintenance, notes from last encounter, lab results, imaging with the patient/caregiver today.   Review of Systems  Constitutional: Negative.   HENT: Negative.    Eyes: Negative.   Respiratory: Negative.    Cardiovascular: Negative.   Gastrointestinal: Negative.   Endocrine: Negative.   Genitourinary: Negative.   Musculoskeletal: Negative.   Skin: Negative.   Allergic/Immunologic: Negative.   Neurological: Negative.   Hematological: Negative.   Psychiatric/Behavioral: Negative.    All other systems reviewed and are negative.    Objective:   Vitals:   07/29/20 1103  BP: 120/76  Pulse: 76  Resp: 16  Temp: 98.2 F (36.8 C)  SpO2: 97%  Weight: 141 lb 14.4 oz (64.4 kg)  Height: 5\' 1"  (1.549 m)    Body mass index is 26.81 kg/m.  Physical Exam Vitals and nursing note reviewed.  Constitutional:      General: She is not in acute distress.    Appearance: Normal appearance. She is normal weight. She is not ill-appearing, toxic-appearing or diaphoretic.  HENT:     Head: Normocephalic.     Right Ear: Ear canal and external ear normal. No drainage or tenderness. No middle ear effusion. There is no impacted cerumen.     Left Ear: Tympanic membrane, ear canal and external ear normal. There is no impacted cerumen.     Ears:     Comments: Right canal flaking, mildly erythematous and edematous    Nose: Nose normal. No congestion.     Mouth/Throat:     Mouth: Mucous membranes are moist.     Pharynx: Oropharynx is clear.  Eyes:     General: No scleral icterus.       Right eye: No discharge.        Left eye: No discharge.     Conjunctiva/sclera: Conjunctivae normal.  Cardiovascular:     Rate and Rhythm: Normal rate and regular rhythm.     Pulses: Normal pulses.     Heart sounds: Normal heart sounds.  Pulmonary:     Effort: Pulmonary effort is normal. No respiratory distress.     Breath sounds: Normal breath sounds. No stridor. No wheezing or rales.  Abdominal:     General: Bowel sounds are normal. There is no distension.     Palpations: Abdomen is soft.     Tenderness: There is no abdominal tenderness.  Skin:    General: Skin is warm.     Coloration: Skin is not jaundiced or pale.     Findings: Rash present. No lesion.  Neurological:     Mental Status: She is alert. Mental status is at baseline.     Gait: Gait normal.  Psychiatric:        Mood and Affect: Mood normal.     Depression screen PHQ  2/9 07/29/2020 04/13/2020 04/13/2020  Decreased Interest 0 1 1  Down, Depressed, Hopeless 0 1 1  PHQ - 2  Score 0 2 2  Altered sleeping 1 1 -  Tired, decreased energy 1 1 -  Change in appetite 0 0 -  Feeling bad or failure about yourself  0 0 -  Trouble concentrating 0 1 -  Moving slowly or fidgety/restless 0 1 -  Suicidal thoughts 0 0 -  PHQ-9 Score 2 6 -  Difficult doing work/chores Not difficult at all Somewhat difficult -  Some recent data might be hidden   Phq9 reviewed and neg today   Assessment & Plan:     ICD-10-CM   1. Localized osteoporosis without current pathological fracture  T90.3 COMPLETE METABOLIC PANEL WITH GFR    VITAMIN D 25 Hydroxy (Vit-D Deficiency, Fractures)    DISCONTINUED: alendronate (FOSAMAX) 70 MG tablet   Reviewed scans, patient needs to start supplementing she is working on weightbearing exercise, advised to Fosamax to strengthen bones    2. Vitamin D deficiency  E09.2 COMPLETE METABOLIC PANEL WITH GFR    VITAMIN D 25 Hydroxy (Vit-D Deficiency, Fractures)   Recheck vitamin D deficiency and will determine needed vitamin D supplement dose    3. 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 Mixed hyperlipidemia  Z30.0 COMPLETE METABOLIC PANEL WITH GFR    Lipid panel   good statin compliance, no SE or concerns due for labs    5. Primary hypertension  T62 COMPLETE METABOLIC PANEL WITH GFR   stable well controlled, BP at goal today, continue valsartan 80 mg daily and DASH    6. Chronic obstructive pulmonary disease, unspecified COPD type ([place])  J44.9    sx well controlled, continue trelegy    7. Hypothyroidism, unspecified type  E03.9    labs recently done show TSH in normal range, continue current dose    8. 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 Moderate episode of recurrent major depressive disorder (HCC) Chronic F33.1    managed by psychiatry, on celexa and benzos, mood good, doing well, phq 9 reviewed       No follow-ups on file.   Delsa Grana, PA-C 07/29/20 11:25 AM

## 2020-07-30 ENCOUNTER — Telehealth: Payer: Self-pay

## 2020-07-30 LAB — COMPLETE METABOLIC PANEL WITH GFR
AG Ratio: 1.5 (calc) (ref 1.0–2.5)
ALT: 12 U/L (ref 6–29)
AST: 17 U/L (ref 10–35)
Albumin: 4.1 g/dL (ref 3.6–5.1)
Alkaline phosphatase (APISO): 79 U/L (ref 37–153)
BUN: 15 mg/dL (ref 7–25)
CO2: 30 mmol/L (ref 20–32)
Calcium: 9.3 mg/dL (ref 8.6–10.4)
Chloride: 105 mmol/L (ref 98–110)
Creat: 0.74 mg/dL (ref 0.50–1.05)
Globulin: 2.7 g/dL (calc) (ref 1.9–3.7)
Glucose, Bld: 88 mg/dL (ref 65–99)
Potassium: 4.5 mmol/L (ref 3.5–5.3)
Sodium: 140 mmol/L (ref 135–146)
Total Bilirubin: 0.7 mg/dL (ref 0.2–1.2)
Total Protein: 6.8 g/dL (ref 6.1–8.1)
eGFR: 91 mL/min/{1.73_m2} (ref >=60)

## 2020-07-30 LAB — VITAMIN D 25 HYDROXY (VIT D DEFICIENCY, FRACTURES): Vit D, 25-Hydroxy: 35 ng/mL (ref 30–100)

## 2020-07-30 LAB — LIPID PANEL
Cholesterol: 177 mg/dL (ref <200)
HDL: 42 mg/dL — ABNORMAL LOW (ref >=50)
LDL Cholesterol (Calc): 118 mg/dL (calc) — ABNORMAL HIGH
Non-HDL Cholesterol (Calc): 135 mg/dL (calc) — ABNORMAL HIGH (ref <130)
Total CHOL/HDL Ratio: 4.2 (calc) (ref <5.0)
Triglycerides: 73 mg/dL (ref <150)

## 2020-07-30 MED ORDER — ALENDRONATE SODIUM 70 MG PO TABS: 70.0000 mg | ORAL_TABLET | ORAL | 3 refills | Status: DC

## 2020-07-30 NOTE — Telephone Encounter (Signed)
Copied from [place] [id number]. Topic: General - Other >> Jul 30, 2020 11:45 AM Yvette Rack wrote: Reason for CRM: Pt requests that Roselyn Reef return her call. Cb#  [phone]

## 2020-07-30 NOTE — Telephone Encounter (Signed)
Pt notified, send to [facility] for Good rx usage

## 2020-07-30 NOTE — Addendum Note (Signed)
Addended by: Docia Furl on: 07/30/2020 02:24 PM   Modules accepted: Orders

## 2020-08-12 ENCOUNTER — Ambulatory Visit: Payer: Medicare Other | Admitting: Family Medicine

## 2020-08-30 ENCOUNTER — Other Ambulatory Visit: Payer: Self-pay | Admitting: Family Medicine

## 2020-08-30 DIAGNOSIS — J329 Chronic sinusitis, unspecified: Secondary | ICD-10-CM

## 2020-10-28 ENCOUNTER — Other Ambulatory Visit: Payer: Self-pay | Admitting: Family Medicine

## 2020-10-28 DIAGNOSIS — N6320 Unspecified lump in the left breast, unspecified quadrant: Secondary | ICD-10-CM

## 2020-11-09 ENCOUNTER — Ambulatory Visit
Admission: RE | Admit: 2020-11-09 | Discharge: 2020-11-09 | Disposition: A | Discharge status: Home / Self Care | Payer: Medicare Other | Source: Ambulatory Visit | Attending: Family Medicine | Admitting: Family Medicine

## 2020-11-09 ENCOUNTER — Other Ambulatory Visit: Payer: Self-pay

## 2020-11-09 DIAGNOSIS — N6320 Unspecified lump in the left breast, unspecified quadrant: Secondary | ICD-10-CM

## 2020-11-09 DIAGNOSIS — N6322 Unspecified lump in the left breast, upper inner quadrant: Secondary | ICD-10-CM | POA: Insufficient documentation

## 2021-01-03 ENCOUNTER — Other Ambulatory Visit: Payer: Self-pay | Admitting: Family Medicine

## 2021-01-03 DIAGNOSIS — I10 Essential (primary) hypertension: Secondary | ICD-10-CM

## 2021-01-03 NOTE — Telephone Encounter (Signed)
Medication Refill - Medication:   valsartan (DIOVAN) 80 MG tablet  Has the patient contacted their pharmacy? Yes.  Advised to contact the office directly,  Rx previously prescribed by Marton Redwood at [facility].   Preferred Pharmacy (with phone number or street name):   [id number] - [place], [place]  [address] [place] [address]  Phone: [phone] Fax: [phone]    Has the patient been seen for an appointment in the last year OR does the patient have an upcoming appointment? Yes.    Agent: Please be advised that RX refills may take up to 3 business days. We ask that you follow-up with your pharmacy.

## 2021-01-04 MED ORDER — VALSARTAN 80 MG PO TABS: 80.0000 mg | ORAL_TABLET | Freq: Every day | ORAL | 0 refills | Status: DC

## 2021-01-04 NOTE — Telephone Encounter (Signed)
Requested Prescriptions  Pending Prescriptions Disp Refills   valsartan (DIOVAN) 80 MG tablet 90 tablet 0    Sig: Take 1 tablet (80 mg total) by mouth daily. For blood pressure     Cardiovascular:  Angiotensin Receptor Blockers Passed - 01/04/2021  3:54 PM      Passed - Cr in normal range and within 180 days    Creat  Date Value Ref Range Status  07/29/2020 0.74 0.50 - 1.05 mg/dL Final   Creatinine, Urine  Date Value Ref Range Status  01/02/2017 116 20 - 275 mg/dL Final         Passed - K in normal range and within 180 days    Potassium  Date Value Ref Range Status  07/29/2020 4.5 3.5 - 5.3 mmol/L Final         Passed - Patient is not pregnant      Passed - Last BP in normal range    BP Readings from Last 1 Encounters:  07/29/20 120/76         Passed - Valid encounter within last 6 months    Recent Outpatient Visits          5 months ago Localized osteoporosis without current pathological fracture   [facility] Delsa Grana, PA-C   8 months ago Annual physical exam   [facility] Trinna Post, [place]   1 year ago Essential hypertension   [facility] Delsa Grana, PA-C   1 year ago Erroneous encounter - disregard   [facility] Delsa Grana, PA-C   2 years ago Type 2 diabetes, controlled, with neuropathy [facility])   [facility] Delsa Grana, PA-C      Future Appointments            In 3 weeks Delsa Grana, PA-C [facility], [place]   In 2 months  [facility], [facility]

## 2021-01-31 ENCOUNTER — Encounter: Payer: Self-pay | Admitting: Physician Assistant

## 2021-01-31 ENCOUNTER — Ambulatory Visit (INDEPENDENT_AMBULATORY_CARE_PROVIDER_SITE_OTHER): Payer: Medicare Other | Admitting: Physician Assistant

## 2021-01-31 VITALS — BP 112/72 | HR 96 | Temp 98.1°F | Resp 16 | Ht 61.0 in | Wt 143.7 lb

## 2021-01-31 DIAGNOSIS — I10 Essential (primary) hypertension: Secondary | ICD-10-CM

## 2021-01-31 DIAGNOSIS — Z0000 Encounter for general adult medical examination without abnormal findings: Secondary | ICD-10-CM

## 2021-01-31 DIAGNOSIS — J449 Chronic obstructive pulmonary disease, unspecified: Secondary | ICD-10-CM | POA: Diagnosis not present

## 2021-01-31 DIAGNOSIS — E1140 Type 2 diabetes mellitus with diabetic neuropathy, unspecified: Secondary | ICD-10-CM | POA: Diagnosis not present

## 2021-01-31 DIAGNOSIS — R059 Cough, unspecified: Secondary | ICD-10-CM | POA: Insufficient documentation

## 2021-01-31 DIAGNOSIS — R0981 Nasal congestion: Secondary | ICD-10-CM | POA: Diagnosis not present

## 2021-01-31 DIAGNOSIS — E039 Hypothyroidism, unspecified: Secondary | ICD-10-CM

## 2021-01-31 DIAGNOSIS — E559 Vitamin D deficiency, unspecified: Secondary | ICD-10-CM

## 2021-01-31 DIAGNOSIS — E782 Mixed hyperlipidemia: Secondary | ICD-10-CM

## 2021-01-31 LAB — VITAMIN D 25 HYDROXY (VIT D DEFICIENCY, FRACTURES): Vit D, 25-Hydroxy: 33 ng/mL (ref 30–100)

## 2021-01-31 LAB — POCT INFLUENZA A/B
Influenza A, POC: NEGATIVE
Influenza B, POC: NEGATIVE

## 2021-01-31 LAB — TSH: TSH: 0.63 mIU/L (ref 0.40–4.50)

## 2021-01-31 NOTE — Assessment & Plan Note (Signed)
Labs ordered today for monitoring Results to dictate further therapy

## 2021-01-31 NOTE — Assessment & Plan Note (Signed)
Chronic, stable and well controlled with Valsartan Reviewed importance of staying active appropriately for age.  Continue current regimen

## 2021-01-31 NOTE — Patient Instructions (Addendum)
Based on your described symptoms and the duration of symptoms it is likely that you have a viral upper respiratory infection (often called a "cold")  Symptoms can last for 3-10 days with lingering cough and intermittent symptoms lasting weeks after that.  The goal of treatment at this time is to reduce your symptoms and discomfort    You can use over the counter medications such as Dayquil/Nyquil, AlkaSeltzer formulations, etc to provide further relief of symptoms according to the manufacturer's instructions  If preferred you can use Coricidin to manage your symptoms rather than those medications mentioned above if you have concerns for your blood pressure.    If your symptoms do not improve or become worse in the next 5-7 days please make an apt at the office so we can see you  Go to the ER if you begin to have more serious symptoms such as shortness of breath, trouble breathing, loss of consciousness, swelling around the eyes, high fever, severe lasting headaches, vision changes or neck pain/stiffness.    For your chronic health conditions I am checking your blood work and will send you a message with results when I get them For now please continue taking your medications as instructed.   It was nice to meet you and I appreciate the opportunity to be involved in your care

## 2021-01-31 NOTE — Assessment & Plan Note (Addendum)
Acute likely secondary to URI as she present with nasal congestion, sore throat, headaches, and sinus pressure since 01/29/2021 Negative for flu, COVID results pending Patient denies SOB, or difficulty breathing at this time and currently I am not suspicious for COPD exacerbation or need for ABX therapy Educated patient on need to follow medication regimen to preserve function in setting of COPD as well as return precautions.  Provided instructions for OTC and home management for URI Patient declined Tessalon pearls and states these do not do much for her.

## 2021-01-31 NOTE — Assessment & Plan Note (Signed)
Chronic and currently managed with rosuvastatin Lipid panel ordered today for monitoring  Continue current medications pending results.

## 2021-01-31 NOTE — Assessment & Plan Note (Signed)
Chronic, stable  Currently using Trelegy, Duoneb and albuterol for management Denies SOB, chest tightness or chronic cough while taking medications Continue current medications

## 2021-01-31 NOTE — Assessment & Plan Note (Addendum)
Chronic and appears stable as managed on current medications and previous TSH levels Patient reports dry eyes, hair loss, and fatigue but cannot rule out stress response due to emotionally difficult months preceding apt.  TSH ordered today for monitoring  Continue current medications as directed at this time.  Recommend further discussion with patient  to determine if medication dose is appropriate to manage symptoms

## 2021-01-31 NOTE — Assessment & Plan Note (Signed)
Chronic not currently managed with medications Most recent A1c was 5.5% in March 2022 Patient states DM was removed as a problem some time ago and is not a concern for her today Recommend A1c at follow up for monitoring

## 2021-01-31 NOTE — Progress Notes (Signed)
Established Patient Office Visit  Subjective:  Patient ID: Deborah Blackwell, female    DOB: Sep 29, 1960  Age: 61 y.o. MRN: [id number]  Introduced myself to the patient as a PA-C and provided education on APPs in clinical practice.    CC:  Chief Complaint  Patient presents with   Follow-up   Hyperlipidemia   Hypertension   COPD   Hypothyroidism   Cough    Sx started around 1/14 constant cough   Nasal Congestion    HPI Deborah Blackwell presents for hypertension, COPD, hyperlipidemia, and hypothyroidism  Deborah Blackwell has an acute concern for cough that began on 01/29/2021  with associated nasal congestion States it is an intermittently productive cough.   Hypertension: chronic, well managed with Valsartan. Reports daily compliance   COPD: Chronic, currently managed with Trelegy, Duoneb, and albuterol inhaler  Deborah Blackwell states breathing has been fine, minus recent coughing symptoms Cough has not been impacted by nebulizer treatments  Hyperlipidemia; Chronic, well controlled with Rosuvastatin  Hypothyroidism: chronic, currently taking Levothyroxine for management. States Deborah Blackwell has problems with dry eyes, hair loss, dry skin,  Deborah Blackwell is going to eye doctor to help with dry eye Reports Deborah Blackwell has been fatigued and irritable before the cough began. Nov and Dec are difficult for her due to past family events   Vitamin D deficiency and osteoporosis: Recently started on Fosamax weekly    Past Medical History:  Diagnosis Date   Angina at rest [facility])    Arthritis    Asthma    Back pain with right-sided sciatica 09/30/2014   Bipolar disorder ([place])    Congenital scoliosis    COPD (chronic obstructive pulmonary disease) (HCC)    DDD (degenerative disc disease), lumbar    Depression    Diabetes mellitus without complication (HCC)    GERD (gastroesophageal reflux disease)    Gout    History of kidney stones    History of scoliosis    was casted as a child   Hyperlipidemia    Hypertension    Lumbar  radiculopathy 09/30/2014   Migraines    Panic attack    RA (rheumatoid arthritis) ([place])     Past Surgical History:  Procedure Laterality Date   ABDOMINAL HYSTERECTOMY  2003   one ovary remains: due to heavy bleeding/endometrioma/cysts   BACK SURGERY     and injections   CARPAL TUNNEL RELEASE     COLONOSCOPY WITH PROPOFOL N/A 12/09/2019   Procedure: COLONOSCOPY WITH PROPOFOL;  Surgeon: [name], MD;  Location: [facility] ENDOSCOPY;  Service: Endoscopy;  Laterality: N/A;   FOOT SURGERY Right    bone spur   POLYPECTOMY     uterine    Family History  Problem Relation Age of Onset   Alzheimer's disease Mother    Dementia Mother    Cancer Mother        colon   Heart disease Mother    Hypertension Mother    Depression Mother    Alzheimer's disease Father    Dementia Father    Hypertension Father    Cancer Brother        lung   Lung disease Brother    Hypertension Sister    Pneumonia Brother    Heart failure Brother    Breast cancer Neg Hx     Social History   Socioeconomic History   Marital status: Widowed    Spouse name: Not on file   Number of children: 0   Years of education: Not on  file   Highest education level: Associate degree: academic program  Occupational History   Not on file  Tobacco Use   Smoking status: Former    Packs/day: 2.00    Years: 5.00    Pack years: 10.00    Types: Cigarettes    Quit date: 02/02/1991    Years since quitting: 30.0   Smokeless tobacco: Never  Vaping Use   Vaping Use: Never used  Substance and Sexual Activity   Alcohol use: No    Alcohol/week: 0.0 standard drinks   Drug use: No   Sexual activity: Not Currently  Other Topics Concern   Not on file  Social History Narrative   Lives alone   Social Determinants of Health   Financial Resource Strain: Low Risk    Difficulty of Paying Living Expenses: Not very hard  Food Insecurity: No Food Insecurity   Worried About Charity fundraiser in the Last Year: Never true   Ran  Out of Food in the Last Year: Never true  Transportation Needs: No Transportation Needs   Lack of Transportation (Medical): No   Lack of Transportation (Non-Medical): No  Physical Activity: Sufficiently Active   Days of Exercise per Week: 7 days   Minutes of Exercise per Session: 30 min  Stress: No Stress Concern Present   Feeling of Stress : Only a little  Social Connections: Moderately Isolated   Frequency of Communication with Friends and Family: Once a week   Frequency of Social Gatherings with Friends and Family: Once a week   Attends Religious Services: More than 4 times per year   Active Member of [employer] or Organizations: Yes   Attends Archivist Meetings: More than 4 times per year   Marital Status: Widowed  Human resources officer Violence: Not At Risk   Fear of Current or Ex-Partner: No   Emotionally Abused: No   Physically Abused: No   Sexually Abused: No    Outpatient Medications Prior to Visit  Medication Sig Dispense Refill   albuterol (VENTOLIN HFA) 108 (90 Base) MCG/ACT inhaler Inhale 2 puffs into the lungs every 4 (four) hours as needed for wheezing or shortness of breath. 1 Inhaler 0   alendronate (FOSAMAX) 70 MG tablet Take 1 tablet (70 mg total) by mouth every 7 (seven) days. Take with a full glass of water on an empty stomach. 12 tablet 3   ALPRAZolam (XANAX) 1 MG tablet Take 1 tablet by mouth 2 (two) times daily.  2   citalopram (CELEXA) 20 MG tablet Take 20 mg by mouth daily.     Fluticasone-Umeclidin-Vilant (TRELEGY ELLIPTA) 200-62.5-25 MCG/INH AEPB Inhale 1 puff into the lungs daily. 28 each 1   ipratropium-albuterol (DUONEB) 0.5-2.5 (3) MG/3ML SOLN Take 3 mLs by nebulization every 6 (six) hours as needed (for coughing fits, wheeze, SOB). 180 mL 1   levocetirizine (XYZAL) 5 MG tablet TAKE 1 TABLET BY MOUTH EVERY DAY IN THE EVENING 90 tablet 3   levothyroxine (SYNTHROID) 50 MCG tablet TAKE 1 TABLET (50 MCG) BEFORE BREAKFAST X 5 DAYS A WEEK AND TAKE 1.5  TABLETS (75 MCG) BREAKFAST 2X A WEEK 103 tablet 3   neomycin-polymyxin-hydrocortisone (CORTISPORIN) OTIC solution Place 3 drops into the right ear 4 (four) times daily as needed. 10 mL 2   nystatin (NYSTATIN) powder Apply topically to rash two to three times a day 30 g 1   rosuvastatin (CRESTOR) 40 MG tablet Take 1 tablet (40 mg total) by mouth daily. 90 tablet 2  triamcinolone ointment (KENALOG) 0.1 % Apply 1 application topically 2 (two) times daily as needed. 30 g 2   valsartan (DIOVAN) 80 MG tablet Take 1 tablet (80 mg total) by mouth daily. For blood pressure 90 tablet 0   No facility-administered medications prior to visit.    Allergies  Allergen Reactions   Contrast Media [Iodinated Contrast Media] Shortness Of Breath   Aspirin Nausea And Vomiting   Macrobid [Nitrofurantoin Monohyd Macro] Hives and Nausea Only    ROS Review of Systems  Constitutional:  Positive for fatigue. Negative for fever.  HENT:  Positive for congestion, postnasal drip, sinus pressure, sinus pain and sore throat. Negative for ear pain (right ear pain) and rhinorrhea.   Eyes:  Negative for visual disturbance.  Respiratory:  Positive for cough. Negative for shortness of breath and wheezing.   Cardiovascular:  Negative for chest pain, palpitations and leg swelling.  Gastrointestinal:  Negative for diarrhea, nausea and vomiting.  Musculoskeletal:  Negative for arthralgias, back pain and myalgias.  Skin:  Negative for rash.  Neurological:  Positive for headaches. Negative for dizziness, weakness and light-headedness.     Objective:    Physical Exam Constitutional:      Appearance: Normal appearance.  HENT:     Right Ear: Hearing and ear canal normal. A middle ear effusion is present. Tympanic membrane is bulging. Tympanic membrane is not injected or perforated.     Left Ear: Hearing and ear canal normal. Tympanic membrane is not injected, scarred, perforated, erythematous, retracted or bulging.     Ears:      Comments: Mild bulging and cloudy TM      Mouth/Throat:     Lips: Pink.     Tongue: No lesions.     Pharynx: Oropharynx is clear. Uvula midline. No pharyngeal swelling, oropharyngeal exudate, posterior oropharyngeal erythema or uvula swelling.     Tonsils: No tonsillar exudate or tonsillar abscesses.  Cardiovascular:     Rate and Rhythm: Normal rate and regular rhythm.     Heart sounds: Normal heart sounds.  Pulmonary:     Effort: Pulmonary effort is normal.     Breath sounds: Normal breath sounds. No decreased air movement. No decreased breath sounds, wheezing, rhonchi or rales.  Musculoskeletal:     Right lower leg: No edema.     Left lower leg: No edema.  Lymphadenopathy:     Head:     Right side of head: No submental or submandibular adenopathy.     Left side of head: No submental or submandibular adenopathy.     Upper Body:     Right upper body: No supraclavicular adenopathy.     Left upper body: No supraclavicular adenopathy.  Neurological:     Mental Status: Deborah Blackwell is alert.    BP 112/72    Pulse 96    Temp 98.1 F (36.7 C) (Oral)    Resp 16    Ht '5\' 1"'  (1.549 m)    Wt 143 lb 11.2 oz (65.2 kg)    SpO2 96%    BMI 27.15 kg/m  Wt Readings from Last 3 Encounters:  01/31/21 143 lb 11.2 oz (65.2 kg)  07/29/20 141 lb 14.4 oz (64.4 kg)  04/13/20 143 lb 14.4 oz (65.3 kg)     Health Maintenance Due  Topic Date Due   Zoster Vaccines- Shingrix (1 of 2) Never done   OPHTHALMOLOGY EXAM  10/03/2018   TETANUS/TDAP  11/11/2019   COVID-19 Vaccine (4 - Booster for Moderna series)  02/03/2020   HEMOGLOBIN A1C  10/14/2020    There are no preventive care reminders to display for this patient.  Lab Results  Component Value Date   TSH 1.02 04/13/2020   Lab Results  Component Value Date   WBC 7.6 04/13/2020   HGB 13.8 04/13/2020   HCT 42.0 04/13/2020   MCV 87.3 04/13/2020   PLT 267 04/13/2020   Lab Results  Component Value Date   NA 140 07/29/2020   K 4.5 07/29/2020    CO2 30 07/29/2020   GLUCOSE 88 07/29/2020   BUN 15 07/29/2020   CREATININE 0.74 07/29/2020   BILITOT 0.7 07/29/2020   ALKPHOS 88 10/20/2015   AST 17 07/29/2020   ALT 12 07/29/2020   PROT 6.8 07/29/2020   ALBUMIN 4.0 10/20/2015   CALCIUM 9.3 07/29/2020   ANIONGAP 9 10/20/2015   EGFR 91 07/29/2020   Lab Results  Component Value Date   CHOL 177 07/29/2020   Lab Results  Component Value Date   HDL 42 (L) 07/29/2020   Lab Results  Component Value Date   LDLCALC 118 (H) 07/29/2020   Lab Results  Component Value Date   TRIG 73 07/29/2020   Lab Results  Component Value Date   CHOLHDL 4.2 07/29/2020   Lab Results  Component Value Date   HGBA1C 5.5 04/13/2020      Assessment & Plan:   Problem List Items Addressed This Visit       Cardiovascular and Mediastinum   Hypertension (Chronic)    Chronic, stable and well controlled with Valsartan Reviewed importance of staying active appropriately for age.  Continue current regimen       Relevant Orders   CBC with Differential/Platelet   COMPLETE METABOLIC PANEL WITH GFR     Respiratory   COPD (chronic obstructive pulmonary disease) (HCC) - Primary (Chronic)    Chronic, stable  Currently using Trelegy, Duoneb and albuterol for management Denies SOB, chest tightness or chronic cough while taking medications Continue current medications         Endocrine   Hypothyroidism (Chronic)    Chronic and appears stable as managed on current medications and previous TSH levels Patient reports dry eyes, hair loss, and fatigue but cannot rule out stress response due to emotionally difficult months preceding apt.  TSH ordered today for monitoring  Continue current medications as directed at this time.  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       Relevant Orders   Lipid panel   Vitamin D deficiency    Labs ordered today for monitoring Results to dictate further therapy       Relevant Orders   Vitamin D (25 hydroxy)   Cough    Acute likely secondary to URI as Deborah Blackwell present with nasal congestion, sore throat, headaches, and sinus pressure since 01/29/2021 Negative for flu, COVID results pending  Patient denies SOB, or difficulty breathing at this time and currently I am not suspicious for COPD exacerbation or need for ABX therapy Educated patient on need to follow medication regimen to preserve function in setting of COPD as well as return precautions.  Provided instructions for OTC and home management for URI Patient declined Tessalon pearls and states these do not do much for her.      Relevant Orders   POCT Influenza A/B (Completed)   Novel Coronavirus, NAA ([facility])   Other Visit Diagnoses     Nasal congestion       Relevant Orders   POCT Influenza A/B (Completed)   Novel Coronavirus, NAA ([facility])   Routine check-up       Relevant Orders   Lipid panel   CBC with Differential/Platelet   COMPLETE METABOLIC PANEL WITH GFR   TSH        Return in about 6 months (around 07/31/2021) for HTN, COPD, hypothyroidism, hyperlipidemia.   I, Sandi Towe E Gaddiel Cullens, PA-C, have reviewed all documentation for this visit. The documentation on 01/31/21 for the exam, diagnosis, procedures, and orders are all accurate and complete.   Valeda Corzine, [place] MPH [facility]   Follow-up: Return in about 6  months (around 07/31/2021) for HTN, COPD, hypothyroidism, hyperlipidemia.    Deja Pisarski E Jacier Gladu, PA-C

## 2021-02-01 LAB — SPECIMEN STATUS REPORT

## 2021-02-01 LAB — SARS-COV-2, NAA 2 DAY TAT

## 2021-02-01 LAB — NOVEL CORONAVIRUS, NAA: SARS-CoV-2, NAA: NOT DETECTED

## 2021-03-31 ENCOUNTER — Ambulatory Visit (INDEPENDENT_AMBULATORY_CARE_PROVIDER_SITE_OTHER): Payer: Medicare Other

## 2021-03-31 ENCOUNTER — Other Ambulatory Visit: Payer: Self-pay

## 2021-03-31 VITALS — BP 112/72 | HR 75 | Temp 98.6°F | Resp 16 | Ht 61.0 in | Wt 143.1 lb

## 2021-03-31 DIAGNOSIS — Z0000 Encounter for general adult medical examination without abnormal findings: Secondary | ICD-10-CM

## 2021-03-31 NOTE — Progress Notes (Signed)
? ?Subjective:  ? Deborah Blackwell is a 61 y.o. female who presents for Medicare Annual (Subsequent) preventive examination. ? ?Review of Systems    ? ?Cardiac Risk Factors include: advanced age (>62mn, >>36women);hypertension;dyslipidemia ? ?   ?Objective:  ?  ?Today's Vitals  ? 03/31/21 1012 03/31/21 1015  ?BP: 112/72   ?Pulse: 75   ?Resp: 16   ?Temp: 98.6 ?F (37 ?C)   ?TempSrc: Oral   ?SpO2: 98%   ?Weight: 143 lb 1.6 oz (64.9 kg)   ?Height: '5\' 1"'$  (1.549 m)   ?PainSc:  4   ? ?Body mass index is 27.04 kg/m?. ? ?Advanced Directives 03/31/2021 03/30/2020 12/09/2019 03/27/2019 03/22/2018 05/02/2016 10/18/2015  ?Does Patient Have a Medical Advance Directive? No No No No No No No  ?Would patient like information on creating a medical advance directive? No - Patient declined No - Patient declined - No - Patient declined No - Patient declined - No - patient declined information  ?Some encounter information is confidential and restricted. Go to Review Flowsheets activity to see all data.  ? ? ?Current Medications (verified) ?Outpatient Encounter Medications as of 03/31/2021  ?Medication Sig  ? albuterol (VENTOLIN HFA) 108 (90 Base) MCG/ACT inhaler Inhale 2 puffs into the lungs every 4 (four) hours as needed for wheezing or shortness of breath.  ? alendronate (FOSAMAX) 70 MG tablet Take 1 tablet (70 mg total) by mouth every 7 (seven) days. Take with a full glass of water on an empty stomach.  ? ALPRAZolam (XANAX) 1 MG tablet Take 1 tablet by mouth 2 (two) times daily.  ? Calcium Carb-Cholecalciferol (CALCIUM 600+D) 600-10 MG-MCG TABS Take by mouth.  ? citalopram (CELEXA) 10 MG tablet Take 30 mg by mouth daily.  ? Fluticasone-Umeclidin-Vilant (TRELEGY ELLIPTA) 200-62.5-25 MCG/INH AEPB Inhale 1 puff into the lungs daily.  ? ipratropium-albuterol (DUONEB) 0.5-2.5 (3) MG/3ML SOLN Take 3 mLs by nebulization every 6 (six) hours as needed (for coughing fits, wheeze, SOB).  ? levocetirizine (XYZAL) 5 MG tablet TAKE 1 TABLET BY MOUTH EVERY  DAY IN THE EVENING  ? levothyroxine (SYNTHROID) 50 MCG tablet TAKE 1 TABLET (50 MCG) BEFORE BREAKFAST X 5 DAYS A WEEK AND TAKE 1.5 TABLETS (75 MCG) BREAKFAST 2X A WEEK  ? nystatin (NYSTATIN) powder Apply topically to rash two to three times a day  ? rosuvastatin (CRESTOR) 40 MG tablet Take 1 tablet (40 mg total) by mouth daily.  ? triamcinolone ointment (KENALOG) 0.1 % Apply 1 application topically 2 (two) times daily as needed.  ? valsartan (DIOVAN) 80 MG tablet Take 1 tablet (80 mg total) by mouth daily. For blood pressure  ? vitamin B-12 (CYANOCOBALAMIN) 1000 MCG tablet Take 1,000 mcg by mouth daily.  ? [DISCONTINUED] citalopram (CELEXA) 20 MG tablet Take 20 mg by mouth daily.  ? [DISCONTINUED] neomycin-polymyxin-hydrocortisone (CORTISPORIN) OTIC solution Place 3 drops into the right ear 4 (four) times daily as needed.  ? ?No facility-administered encounter medications on file as of 03/31/2021.  ? ? ?Allergies (verified) ?Contrast media [iodinated contrast media], Aspirin, and Macrobid [nitrofurantoin monohyd macro]  ? ?History: ?Past Medical History:  ?Diagnosis Date  ? [facility]   ? Arthritis   ? Asthma   ? Back pain with right-sided sciatica 09/30/2014  ? Bipolar disorder ([place]   ? Congenital scoliosis   ? COPD (chronic obstructive pulmonary disease) ([place]   ? DDD (degenerative disc disease), lumbar   ? Depression   ? GERD (gastroesophageal reflux disease)   ?  Gout   ? History of diabetes mellitus   ? previously on medication, removed once pt lost weight, A1c WNL > 2 yrs  ? History of kidney stones   ? History of scoliosis   ? was casted as a child  ? Hyperlipidemia   ? Hypertension   ? Lumbar radiculopathy 09/30/2014  ? Migraines   ? Panic attack   ? RA (rheumatoid arthritis) ([place])   ? ?Past Surgical History:  ?Procedure Laterality Date  ? ABDOMINAL HYSTERECTOMY  2003  ? one ovary remains: due to heavy bleeding/endometrioma/cysts  ? BACK SURGERY    ? and injections  ? CARPAL TUNNEL RELEASE    ?  COLONOSCOPY WITH PROPOFOL N/A 12/09/2019  ? Procedure: COLONOSCOPY WITH PROPOFOL;  Surgeon: [name], MD;  [facility] END[facility]COPY;  Service: Endoscopy;  Laterality: N/A;  ? FOOT SURGERY Right   ? bone spur  ? POLYPECTOMY    ? uterine  ? ?Family History  ?Problem Relation Age of Onset  ? Alzheimer's disease Mother   ? Dementia Mother   ? Cancer Mother   ?     colon  ? Heart disease Mother   ? Hypertension Mother   ? Depression Mother   ? Alzheimer's disease Father   ? Dementia Father   ? Hypertension Father   ? Cancer Brother   ?     lung  ? Lung disease Brother   ? Hypertension Sister   ? Pneumonia Brother   ? Heart failure Brother   ? Breast cancer Neg Hx   ? ?Social History  ? ?Socioeconomic History  ? Marital status: Widowed  ?  Spouse name: Not on file  ? Number of children: 0  ? Years of education: Not on file  ? Highest education level: Associate degree: academic program  ?Occupational History  ? Not on file  ?Tobacco Use  ? Smoking status: Former  ?  Packs/day: 2.00  ?  Years: 5.00  ?  Pack years: 10.00  ?  Types: Cigarettes  ?  Quit date: 02/02/1991  ?  Years since quitting: 30.1  ? Smokeless tobacco: Never  ?Vaping Use  ? Vaping Use: Never used  ?Substance and Sexual Activity  ? Alcohol use: No  ?  Alcohol/week: 0.0 standard drinks  ? Drug use: No  ? Sexual activity: Not Currently  ?Other Topics Concern  ? Not on file  ?Social History Narrative  ? Lives alone.  ?   ? Volunteers and [employer], [place]  ? ?Social Determinants of Health  ? ?Financial Resource Strain: Low Risk   ? Difficulty of Paying Living Expenses: Not very hard  ?Food Insecurity: No Food Insecurity  ? Worried About Charity fundraiser in the Last Year: Never true  ? Ran Out of Food in the Last Year: Never true  ?Transportation Needs: No Transportation Needs  ? Lack of Transportation (Medical): No  ? Lack of Transportation (Non-Medical): No  ?Physical Activity: Sufficiently Active  ? Days of  Exercise per Week: 7 days  ? Minutes of Exercise per Session: 30 min  ?Stress: No Stress Concern Present  ? Feeling of Stress : Only a little  ?Social Connections: Moderately Integrated  ? Frequency of Communication with Friends and Family: More than three times a week  ? Frequency of Social Gatherings with Friends and Family: Once a week  ? Attends Religious Services: More than 4 times per year  ? Active Member of  Clubs or Organizations: Yes  ? Attends Archivist Meetings: More than 4 times per year  ? Marital Status: Widowed  ? ? ?Tobacco Counseling ?Counseling given: Not Answered ? ? ?Clinical Intake: ? ?Pre-visit preparation completed: Yes ? ?Pain : 0-10 ?Pain Score: 4  ?Pain Type: Chronic pain ?Pain Location: Back ?Pain Orientation: Lower ?Pain Descriptors / Indicators: Aching, Sore ?Pain Onset: More than a month ago ?Pain Frequency: Intermittent ? ?  ? ?BMI - recorded: 27.04 ?Nutritional Status: BMI 25 -29 Overweight ?Nutritional Risks: None ?Diabetes: No ? ?How often do you need to have someone help you when you read instructions, pamphlets, or other written materials from your doctor or pharmacy?: 1 - Never ? ? ? ?Interpreter Needed?: No ? ?Information entered by :: Clemetine Marker LPN ? ? ?Activities of Daily Living ?In your present state of health, do you have any difficulty performing the following activities: 03/31/2021 01/31/2021  ?Hearing? N N  ?Vision? N N  ?Difficulty concentrating or making decisions? N N  ?Walking or climbing stairs? N N  ?Dressing or bathing? N N  ?Doing errands, shopping? N N  ?Preparing Food and eating ? N -  ?Using the Toilet? N -  ?In the past six months, have you accidently leaked urine? Y -  ?Comment wears pads for protection -  ?Do you have problems with loss of bowel control? N -  ?Managing your Medications? N -  ?Managing your Finances? N -  ?Housekeeping or managing your Housekeeping? N -  ?Some recent data might be hidden  ? ? ?Patient Care Team: ?Delsa Grana, PA-C  as PCP - General (Family Medicine) ?Myer Haff, MD as Referring Physician (Psychiatry) ? ?Indicate any recent Medical Services you may have received from other than [facility] providers in the past year (date ma

## 2021-03-31 NOTE — Patient Instructions (Signed)
Ms. Oliveria , ?Thank you for taking time to come for your Medicare Wellness Visit. I appreciate your ongoing commitment to your health goals. Please review the following plan we discussed and let me know if I can assist you in the future.  ? ?Screening recommendations/referrals: ?Colonoscopy: done 12/09/19. Repeat 11/2024 ?Mammogram: done 11/09/20 ?Bone Density: done 05/24/20 ?Recommended yearly ophthalmology/optometry visit for glaucoma screening and checkup ?Recommended yearly dental visit for hygiene and checkup ? ?Vaccinations: ?Influenza vaccine: done 11/02/20 ?Pneumococcal vaccine: done 05/07/13 ?Tdap vaccine: due ?Shingles vaccine: please bring a copy of your vaccine record to your next appt  ?Covid-19: done 04/18/19, 05/16/19 & 12/09/19 ? ?Advanced directives: Advance directive discussed with you today. Even though you declined this today please call our office should you change your mind and we can give you the proper paperwork for you to fill out.  ? ?Conditions/risks identified: Keep up the great work! ? ? ?Next appointment: Follow up in one year for your annual wellness visit.  ? ?Preventive Care 40-64 Years, Female ?Preventive care refers to lifestyle choices and visits with your health care provider that can promote health and wellness. ?What does preventive care include? ?A yearly physical exam. This is also called an annual well check. ?Dental exams once or twice a year. ?Routine eye exams. Ask your health care provider how often you should have your eyes checked. ?Personal lifestyle choices, including: ?Daily care of your teeth and gums. ?Regular physical activity. ?Eating a healthy diet. ?Avoiding tobacco and drug use. ?Limiting alcohol use. ?Practicing safe sex. ?Taking low-dose aspirin daily starting at age 53. ?Taking vitamin and mineral supplements as recommended by your health care provider. ?What happens during an annual well check? ?The services and screenings done by your health care provider during  your annual well check will depend on your age, overall health, lifestyle risk factors, and family history of disease. ?Counseling  ?Your health care provider may ask you questions about your: ?Alcohol use. ?Tobacco use. ?Drug use. ?Emotional well-being. ?Home and relationship well-being. ?Sexual activity. ?Eating habits. ?Work and work Statistician. ?Method of birth control. ?Menstrual cycle. ?Pregnancy history. ?Screening  ?You may have the following tests or measurements: ?Height, weight, and BMI. ?Blood pressure. ?Lipid and cholesterol levels. These may be checked every 5 years, or more frequently if you are over 60 years old. ?Skin check. ?Lung cancer screening. You may have this screening every year starting at age 48 if you have a 30-pack-year history of smoking and currently smoke or have quit within the past 15 years. ?Fecal occult blood test (FOBT) of the stool. You may have this test every year starting at age 85. ?Flexible sigmoidoscopy or colonoscopy. You may have a sigmoidoscopy every 5 years or a colonoscopy every 10 years starting at age 64. ?Hepatitis C blood test. ?Hepatitis B blood test. ?Sexually transmitted disease (STD) testing. ?Diabetes screening. This is done by checking your blood sugar (glucose) after you have not eaten for a while (fasting). You may have this done every 1-3 years. ?Mammogram. This may be done every 1-2 years. Talk to your health care provider about when you should start having regular mammograms. This may depend on whether you have a family history of breast cancer. ?BRCA-related cancer screening. This may be done if you have a family history of breast, ovarian, tubal, or peritoneal cancers. ?Pelvic exam and Pap test. This may be done every 3 years starting at age 58. Starting at age 55, this may be done every 5 years if  you have a Pap test in combination with an HPV test. ?Bone density scan. This is done to screen for osteoporosis. You may have this scan if you are at  high risk for osteoporosis. ?Discuss your test results, treatment options, and if necessary, the need for more tests with your health care provider. ?Vaccines  ?Your health care provider may recommend certain vaccines, such as: ?Influenza vaccine. This is recommended every year. ?Tetanus, diphtheria, and acellular pertussis (Tdap, Td) vaccine. You may need a Td booster every 10 years. ?Zoster vaccine. You may need this after age 36. ?Pneumococcal 13-valent conjugate (PCV13) vaccine. You may need this if you have certain conditions and were not previously vaccinated. ?Pneumococcal polysaccharide (PPSV23) vaccine. You may need one or two doses if you smoke cigarettes or if you have certain conditions. ?Talk to your health care provider about which screenings and vaccines you need and how often you need them. ?This information is not intended to replace advice given to you by your health care provider. Make sure you discuss any questions you have with your health care provider. ?Document Released: 01/29/2015 Document Revised: 09/22/2015 Document Reviewed: 11/03/2014 ?Elsevier Interactive Patient Education ? 2017 [employer] ? ? ? ?Fall Prevention in the Home ?Falls can cause injuries. They can happen to people of all ages. There are many things you can do to make your home safe and to help prevent falls. ?What can I do on the outside of my home? ?Regularly fix the edges of walkways and driveways and fix any cracks. ?Remove anything that might make you trip as you walk through a door, such as a raised step or threshold. ?Trim any bushes or trees on the path to your home. ?Use bright outdoor lighting. ?Clear any walking paths of anything that might make someone trip, such as rocks or tools. ?Regularly check to see if handrails are loose or broken. Make sure that both sides of any steps have handrails. ?Any raised decks and porches should have guardrails on the edges. ?Have any leaves, snow, or ice cleared  regularly. ?Use sand or salt on walking paths during winter. ?Clean up any spills in your garage right away. This includes oil or grease spills. ?What can I do in the bathroom? ?Use night lights. ?Install grab bars by the toilet and in the tub and shower. Do not use towel bars as grab bars. ?Use non-skid mats or decals in the tub or shower. ?If you need to sit down in the shower, use a plastic, non-slip stool. ?Keep the floor dry. Clean up any water that spills on the floor as soon as it happens. ?Remove soap buildup in the tub or shower regularly. ?Attach bath mats securely with double-sided non-slip rug tape. ?Do not have throw rugs and other things on the floor that can make you trip. ?What can I do in the bedroom? ?Use night lights. ?Make sure that you have a light by your bed that is easy to reach. ?Do not use any sheets or blankets that are too big for your bed. They should not hang down onto the floor. ?Have a firm chair that has side arms. You can use this for support while you get dressed. ?Do not have throw rugs and other things on the floor that can make you trip. ?What can I do in the kitchen? ?Clean up any spills right away. ?Avoid walking on wet floors. ?Keep items that you use a lot in easy-to-reach places. ?If you need to reach something above  you, use a strong step stool that has a grab bar. ?Keep electrical cords out of the way. ?Do not use floor polish or wax that makes floors slippery. If you must use wax, use non-skid floor wax. ?Do not have throw rugs and other things on the floor that can make you trip. ?What can I do with my stairs? ?Do not leave any items on the stairs. ?Make sure that there are handrails on both sides of the stairs and use them. Fix handrails that are broken or loose. Make sure that handrails are as long as the stairways. ?Check any carpeting to make sure that it is firmly attached to the stairs. Fix any carpet that is loose or worn. ?Avoid having throw rugs at the top or  bottom of the stairs. If you do have throw rugs, attach them to the floor with carpet tape. ?Make sure that you have a light switch at the top of the stairs and the bottom of the stairs. If you do not have them, ask

## 2021-04-07 ENCOUNTER — Other Ambulatory Visit: Payer: Self-pay | Admitting: Family Medicine

## 2021-04-07 DIAGNOSIS — I10 Essential (primary) hypertension: Secondary | ICD-10-CM

## 2021-04-19 ENCOUNTER — Ambulatory Visit (INDEPENDENT_AMBULATORY_CARE_PROVIDER_SITE_OTHER): Payer: Medicare Other | Admitting: Family Medicine

## 2021-04-19 ENCOUNTER — Encounter: Payer: Self-pay | Admitting: Family Medicine

## 2021-04-19 VITALS — BP 118/68 | HR 79 | Temp 97.7°F | Resp 16 | Ht 61.0 in | Wt 144.2 lb

## 2021-04-19 DIAGNOSIS — R350 Frequency of micturition: Secondary | ICD-10-CM

## 2021-04-19 DIAGNOSIS — E1140 Type 2 diabetes mellitus with diabetic neuropathy, unspecified: Secondary | ICD-10-CM | POA: Diagnosis not present

## 2021-04-19 DIAGNOSIS — I10 Essential (primary) hypertension: Secondary | ICD-10-CM

## 2021-04-19 DIAGNOSIS — E782 Mixed hyperlipidemia: Secondary | ICD-10-CM | POA: Diagnosis not present

## 2021-04-19 DIAGNOSIS — Z5181 Encounter for therapeutic drug level monitoring: Secondary | ICD-10-CM

## 2021-04-19 DIAGNOSIS — R32 Unspecified urinary incontinence: Secondary | ICD-10-CM | POA: Diagnosis not present

## 2021-04-19 MED ORDER — OXYBUTYNIN CHLORIDE ER 5 MG PO TB24: ORAL_TABLET | ORAL | 0 refills | Status: DC

## 2021-04-19 NOTE — Progress Notes (Signed)
? ? ?Patient ID: Deborah Blackwell, female    DOB: [birth year], 61 y.o.   MRN: [id number] ? ?PCP: Delsa Grana, PA-C ? ?Chief Complaint  ?Patient presents with  ? Urge Incontinence  ?  Onset for several months it has got worst, unable to hold it. Pt denies pain  ? ? ?Subjective:  ? ?Deborah Blackwell is a 61 y.o. female, presents to clinic with CC of the following: ? ?Patient presents with ongoing urinary frequency and incontinence that has severely worsened over the past 3 to 6 months.  She is to experience occasional leaking and would wear a panty liner at now she is currently not even leaving the house some days or having to wear full briefs because of repeated daily urge incontinence and stress incontinence.  She denies any pain, hematuria, vaginal symptoms. ? ? ?She does have a history of chronic back pain,  2015 last lumbar MRI, reviewed today, she denies any concomitant increase in low back pain and she denies any saddle anesthesia incontinence of stool or weakness or numbness in her legs ? ?Patient Active Problem List  ? Diagnosis Date Noted  ? Cough 01/31/2021  ? Osteoporosis 05/27/2020  ? Encounter for screening colonoscopy   ? Polyp of transverse colon   ? 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 Major depression, recurrent ([place]) 01/05/2017  ? Abnormal grief reaction 01/05/2017  ? Chronic pain syndrome 03/07/2016  ? Vitamin D deficiency 11/10/2015  ? Elevated C-reactive protein (CRP) 11/10/2015  ? Arthralgia 10/20/2015  ? Lumbar facet hypertrophy (Bilateral) 10/20/2015  ? Lumbar facet syndrome (Location of Primary Source of Pain) (Right) 10/20/2015  ? Lumbar spondylosis 10/20/2015  ? Osteopenia 10/20/2015  ? Chronic shoulder pain (Right) 10/18/2015  ? Chronic low back pain (Location of Primary Source of Pain) (Right) 10/18/2015  ? Chronic lower extremity pain (Location of Secondary source of pain) (Right) 10/18/2015  ? Occipital neuralgia (Right) 10/18/2015  ? Cervicogenic headache  10/18/2015  ? Drug-seeking behavior 10/06/2015  ? Type 2 diabetes, controlled, with neuropathy ([place]) 09/30/2014  ? Chronic neck pain (Location of Tertiary source of pain) (Right) 09/30/2014  ? Cervical disc disorder with radiculopathy of cervical region 09/30/2014  ? Gout 08/07/2014  ? Hyperlipidemia 08/07/2014  ? Hypothyroidism 08/07/2014  ? 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 COPD (chronic obstructive pulmonary disease) ([place])   ? Hypertension   ? ? ? ? ?Current Outpatient Medications:  ?  albuterol (VENTOLIN HFA) 108 (90 Base) MCG/ACT inhaler, Inhale 2 puffs into the lungs every 4 (four) hours as needed for wheezing or shortness of breath., Disp: 1 Inhaler, Rfl: 0 ?  alendronate (FOSAMAX) 70 MG tablet, Take 1 tablet (70 mg total) by mouth every 7 (seven) days. Take with a full glass of water on an empty stomach., Disp: 12 tablet, Rfl: 3 ?  ALPRAZolam (XANAX) 1 MG tablet, Take 1 tablet by mouth 2 (two) times daily., Disp: , Rfl: 2 ?  Calcium Carb-Cholecalciferol 600-10 MG-MCG TABS, Take by mouth., Disp: , Rfl:  ?  citalopram (CELEXA) 10 MG tablet, Take 30 mg by mouth daily., Disp: , Rfl:  ?  Fluticasone-Umeclidin-Vilant (TRELEGY ELLIPTA) 200-62.5-25 MCG/INH AEPB, Inhale 1 puff into the lungs daily., Disp: 28 each, Rfl: 1 ?  ipratropium-albuterol (DUONEB) 0.5-2.5 (3) MG/3ML SOLN, Take 3 mLs by nebulization every 6 (six) hours as needed (for coughing fits, wheeze, SOB)., Disp: 180 mL, Rfl: 1 ?  levocetirizine (XYZAL) 5 MG tablet, TAKE 1 TABLET BY MOUTH EVERY DAY IN  THE EVENING, Disp: 90 tablet, Rfl: 3 ?  levothyroxine (SYNTHROID) 50 MCG tablet, TAKE 1 TABLET (50 MCG) BEFORE BREAKFAST X 5 DAYS A WEEK AND TAKE 1.5 TABLETS (75 MCG) BREAKFAST 2X A WEEK, Disp: 103 tablet, Rfl: 3 ?  nystatin (NYSTATIN) powder, Apply topically to rash two to three times a day, Disp: 30 g, Rfl: 1 ?  rosuvastatin (CRESTOR) 40 MG tablet, Take 1 tablet (40 mg total) by mouth daily., Disp: 90 tablet, Rfl: 2 ?  triamcinolone ointment (KENALOG) 0.1  %, Apply 1 application topically 2 (two) times daily as needed., Disp: 30 g, Rfl: 2 ?  valsartan (DIOVAN) 80 MG tablet, TAKE 1 TABLET (80 MG TOTAL) BY MOUTH DAILY. FOR BLOOD PRESSURE, Disp: 90 tablet, Rfl: 0 ?  vitamin B-12 (CYANOCOBALAMIN) 1000 MCG tablet, Take 1,000 mcg by mouth daily., Disp: , Rfl:  ? ? ?Allergies  ?Allergen Reactions  ? Contrast Media [Iodinated Contrast Media] Shortness Of Breath  ? Aspirin Nausea And Vomiting  ? Macrobid [Nitrofurantoin Monohyd Macro] Hives and Nausea Only  ? ? ? ?Social History  ? ?Tobacco Use  ? Smoking status: Former  ?  Packs/day: 2.00  ?  Years: 5.00  ?  Pack years: 10.00  ?  Types: Cigarettes  ?  Quit date: 02/02/1991  ?  Years since quitting: 30.2  ? Smokeless tobacco: Never  ?Vaping Use  ? Vaping Use: Never used  ?Substance Use Topics  ? Alcohol use: No  ?  Alcohol/week: 0.0 standard drinks  ? Drug use: No  ?  ? ? ?Chart Review Today: ?I personally reviewed active problem list, medication list, allergies, family history, social history, health maintenance, notes from last encounter, lab results, imaging with the patient/caregiver today. ? ? ?Review of Systems  ?Constitutional: Negative.   ?HENT: Negative.    ?Eyes: Negative.   ?Respiratory: Negative.    ?Cardiovascular: Negative.   ?Gastrointestinal: Negative.  Negative for abdominal pain, constipation, diarrhea, nausea and vomiting.  ?Endocrine: Negative.   ?Genitourinary:  Positive for frequency. Negative for difficulty urinating, dysuria, flank pain, hematuria, pelvic pain, vaginal bleeding, vaginal discharge and vaginal pain.  ?Musculoskeletal: Negative.   ?Skin: Negative.   ?Allergic/Immunologic: Negative.   ?Neurological: Negative.   ?Hematological: Negative.   ?Psychiatric/Behavioral: Negative.    ?All other systems reviewed and are negative. ? ?   ?Objective:  ? ?Vitals:  ? 04/19/21 1440  ?BP: 118/68  ?Pulse: 79  ?Resp: 16  ?Temp: 97.7 ?F (36.5 ?C)  ?TempSrc: Oral  ?SpO2: 95%  ?Weight: 144 lb 3.2 oz (65.4 kg)   ?Height: '5\' 1"'$  (1.549 m)  ?  ?Body mass index is 27.25 kg/m?. ? ?Physical Exam ?Vitals and nursing note reviewed.  ?Constitutional:   ?   General: She is not in acute distress. ?   Appearance: Normal appearance. She is well-developed. She is not ill-appearing, toxic-appearing or diaphoretic.  ?   Interventions: Face mask in place.  ?HENT:  ?   Head: Normocephalic and atraumatic.  ?   Right Ear: External ear normal.  ?   Left Ear: External ear normal.  ?Eyes:  ?   General: Lids are normal. No scleral icterus.    ?   Right eye: No discharge.     ?   Left eye: No discharge.  ?   Conjunctiva/sclera: Conjunctivae normal.  ?Neck:  ?   Trachea: Phonation normal. No tracheal deviation.  ?Cardiovascular:  ?   Rate and Rhythm: Normal rate and regular rhythm.  ?  Pulses: Normal pulses.     ?     Radial pulses are 2+ on the right side and 2+ on the left side.  ?     Posterior tibial pulses are 2+ on the right side and 2+ on the left side.  ?   Heart sounds: Normal heart sounds. No murmur heard. ?  No friction rub. No gallop.  ?Pulmonary:  ?   Effort: Pulmonary effort is normal. No respiratory distress.  ?   Breath sounds: Normal breath sounds. No stridor. No wheezing, rhonchi or rales.  ?Chest:  ?   Chest [place]: No tenderness.  ?Abdominal:  ?   General: Bowel sounds are normal. There is no distension.  ?   Palpations: Abdomen is soft.  ?   Tenderness: There is no abdominal tenderness. There is no right CVA tenderness, left CVA tenderness, guarding or rebound.  ?Musculoskeletal:  ?   Right lower leg: No edema.  ?   Left lower leg: No edema.  ?Skin: ?   General: Skin is warm and dry.  ?   Coloration: Skin is not jaundiced or pale.  ?   Findings: No rash.  ?Neurological:  ?   Mental Status: She is alert.  ?   Motor: No abnormal muscle tone.  ?   Gait: Gait normal.  ?   Comments: Grossly normal sensation to light touch to bilateral lower extremities ?5/5 dorsiflexion and plantarflexion ?Normal gait  ?Psychiatric:     ?   Attention  and Perception: Attention normal.     ?   Mood and Affect: Mood normal. Affect is tearful.     ?   Speech: Speech normal.     ?   Behavior: Behavior normal. Behavior is cooperative.  ?  ? ?Results for

## 2021-04-20 LAB — COMPLETE METABOLIC PANEL WITH GFR
AG Ratio: 1.5 (calc) (ref 1.0–2.5)
ALT: 11 U/L (ref 6–29)
AST: 18 U/L (ref 10–35)
Albumin: 4 g/dL (ref 3.6–5.1)
Alkaline phosphatase (APISO): 52 U/L (ref 37–153)
BUN: 21 mg/dL (ref 7–25)
CO2: 28 mmol/L (ref 20–32)
Calcium: 9.3 mg/dL (ref 8.6–10.4)
Chloride: 103 mmol/L (ref 98–110)
Creat: 0.7 mg/dL (ref 0.50–1.05)
Globulin: 2.7 g/dL (calc) (ref 1.9–3.7)
Glucose, Bld: 76 mg/dL (ref 65–99)
Potassium: 4.2 mmol/L (ref 3.5–5.3)
Sodium: 140 mmol/L (ref 135–146)
Total Bilirubin: 0.7 mg/dL (ref 0.2–1.2)
Total Protein: 6.7 g/dL (ref 6.1–8.1)
eGFR: 98 mL/min/{1.73_m2} (ref >=60)

## 2021-04-20 LAB — URINALYSIS, ROUTINE W REFLEX MICROSCOPIC
Bilirubin Urine: NEGATIVE
Glucose, UA: NEGATIVE
Hgb urine dipstick: NEGATIVE
Ketones, ur: NEGATIVE
Leukocytes,Ua: NEGATIVE
Nitrite: NEGATIVE
Protein, ur: NEGATIVE
Specific Gravity, Urine: 1.015 (ref 1.001–1.035)
pH: 5.5 (ref 5.0–8.0)

## 2021-04-20 LAB — MICROALBUMIN / CREATININE URINE RATIO
Creatinine, Urine: 50 mg/dL (ref 20–275)
Microalb Creat Ratio: 4 mcg/mg creat (ref <30)
Microalb, Ur: 0.2 mg/dL

## 2021-04-20 LAB — LIPID PANEL
Cholesterol: 188 mg/dL (ref <200)
HDL: 44 mg/dL — ABNORMAL LOW (ref >=50)
LDL Cholesterol (Calc): 127 mg/dL (calc) — ABNORMAL HIGH
Non-HDL Cholesterol (Calc): 144 mg/dL (calc) — ABNORMAL HIGH (ref <130)
Total CHOL/HDL Ratio: 4.3 (calc) (ref <5.0)
Triglycerides: 78 mg/dL (ref <150)

## 2021-04-20 LAB — HEMOGLOBIN A1C
Hgb A1c MFr Bld: 5.5 % of total Hgb (ref <5.7)
Mean Plasma Glucose: 111 mg/dL
eAG (mmol/L): 6.2 mmol/L

## 2021-04-22 ENCOUNTER — Encounter: Payer: Self-pay | Admitting: Family Medicine

## 2021-04-27 ENCOUNTER — Telehealth: Payer: Self-pay

## 2021-04-27 ENCOUNTER — Telehealth: Payer: Self-pay | Admitting: Family Medicine

## 2021-04-27 DIAGNOSIS — R32 Unspecified urinary incontinence: Secondary | ICD-10-CM

## 2021-04-27 DIAGNOSIS — R350 Frequency of micturition: Secondary | ICD-10-CM

## 2021-04-27 NOTE — Telephone Encounter (Unsigned)
Copied from [place]. Topic: Quick Communication - Lab Results (Clinic Use ONLY) ?>> Apr 25, 2021 10:54 AM Renteria-Garcia, Jenny Reichmann, CMA wrote: ?Called patient to inform them of recent lab results. When patient returns call, triage nurse may disclose results. ?>> Apr 27, 2021 10:04 AM Yvette Rack wrote: ?Pt called back for lab results. Pt requests return call. Cb# [phone] ?

## 2021-04-27 NOTE — Telephone Encounter (Signed)
Called and spoke w/ pt in regards to lab results. ?

## 2021-04-27 NOTE — Telephone Encounter (Signed)
Called pt to give lab results, pt verbalized understanding. Pt states she was prescribed Oxybutynin '5mg'$  04/19/21. Pt states that her insurance will not cover this medication. Is there another medication that can be prescribed or can the Rx sig, be change so insurance can cover. Please advise pt. ?

## 2021-04-27 NOTE — Telephone Encounter (Signed)
Please advice  

## 2021-04-28 MED ORDER — OXYBUTYNIN CHLORIDE ER 10 MG PO TB24: 10.0000 mg | ORAL_TABLET | Freq: Every day | ORAL | 0 refills | Status: DC

## 2021-04-28 MED ORDER — OXYBUTYNIN CHLORIDE ER 10 MG PO TB24
10.0000 mg | ORAL_TABLET | Freq: Every day | ORAL | 0 refills | Status: DC
Start: 2021-04-28 — End: 2021-04-28

## 2021-04-28 NOTE — Telephone Encounter (Signed)
Pt has been informed based on Leisa message and patient understood. ?

## 2021-04-28 NOTE — Telephone Encounter (Signed)
Pt has been informed and she wants her rx sent over to her [facility] in [place]. She stated she never mentioned in switching it to another pharmacy. Please advice. ?

## 2021-04-28 NOTE — Addendum Note (Signed)
Addended by: Salomon Fick on: 04/28/2021 03:26 PM ? ? Modules accepted: Orders ? ?

## 2021-04-28 NOTE — Telephone Encounter (Signed)
Called pt no answer left vm to call back  °

## 2021-04-28 NOTE — Telephone Encounter (Signed)
Pt called back stating even with discount it is $50.00+. pt states her insurance will cover her medicine if only its written as '10mg'$  a day. Please advice ?

## 2021-04-28 NOTE — Addendum Note (Signed)
Addended by: Delsa Grana on: 04/28/2021 12:53 PM ? ? Modules accepted: Orders ? ?

## 2021-04-28 NOTE — Telephone Encounter (Signed)
Pt called back to follow up requesting to speak with Maricela.  ? ?Pt is requesting a call back.  ? ?

## 2021-04-28 NOTE — Telephone Encounter (Signed)
[facility] request and was notified. ?

## 2021-05-30 ENCOUNTER — Ambulatory Visit: Payer: Medicare Other | Admitting: Urology

## 2021-05-30 VITALS — BP 119/76 | HR 70 | Ht 61.0 in | Wt 141.0 lb

## 2021-05-30 DIAGNOSIS — N3946 Mixed incontinence: Secondary | ICD-10-CM

## 2021-05-30 DIAGNOSIS — R32 Unspecified urinary incontinence: Secondary | ICD-10-CM

## 2021-05-30 LAB — URINALYSIS, COMPLETE
Bilirubin, UA: NEGATIVE
Glucose, UA: NEGATIVE
Ketones, UA: NEGATIVE
Nitrite, UA: NEGATIVE
Protein,UA: NEGATIVE
Specific Gravity, UA: 1.015 (ref 1.005–1.030)
Urobilinogen, Ur: 1 mg/dL (ref 0.2–1.0)
pH, UA: 7 (ref 5.0–7.5)

## 2021-05-30 LAB — MICROSCOPIC EXAMINATION

## 2021-05-30 NOTE — Progress Notes (Signed)
? ?05/30/2021 ?10:13 AM  ? ?Appollonia Klee Perrelli ?1960-05-17 ?[id number] ? ?Referring provider: Delsa Grana, PA-C ?[facility] ?[address] ?[place],  [place] [address] ? ?Chief Complaint  ?Patient presents with  ? Urinary Incontinence  ? ? ?HPI: ?I was consulted to assess the patient's urinary continence.  She leaks with coughing sneezing or walking is the primary symptom.  Sometimes she has urge incontinence.  She has no bedwetting.  She wears 2 pads a day that are damp ? ?She voids every 2 hours gets up twice at night.  Flow varies ? ?She started oxybutynin 2 weeks ago and she is going more frequently but still leaks with nocturia ? ?Has had a hysterectomy.  Back injections lower back. ? ?Kidney stone 40 years ago.  Rare bladder infections.  No bladder surgery.  Prone to constipation ? ? ?PMH: ?Past Medical History:  ?Diagnosis Date  ? Angina at rest [facility])   ? Arthritis   ? Asthma   ? Back pain with right-sided sciatica 09/30/2014  ? Bipolar disorder ([place])   ? Congenital scoliosis   ? COPD (chronic obstructive pulmonary disease) ([place])   ? DDD (degenerative disc disease), lumbar   ? Depression   ? GERD (gastroesophageal reflux disease)   ? Gout   ? History of diabetes mellitus   ? previously on medication, removed once pt lost weight, A1c WNL > 2 yrs  ? History of kidney stones   ? History of scoliosis   ? was casted as a child  ? Hyperlipidemia   ? Hypertension   ? Lumbar radiculopathy 09/30/2014  ? Migraines   ? Panic attack   ? RA (rheumatoid arthritis) ([place])   ? ? ?Surgical History: ?Past Surgical History:  ?Procedure Laterality Date  ? ABDOMINAL HYSTERECTOMY  2003  ? one ovary remains: due to heavy bleeding/endometrioma/cysts  ? BACK SURGERY    ? and injections  ? CARPAL TUNNEL RELEASE    ? COLONOSCOPY WITH PROPOFOL N/A 12/09/2019  ? Procedure: COLONOSCOPY WITH PROPOFOL;  Surgeon: [name], MD;  Location: [facility] ENDOSCOPY;  Service: Endoscopy;  Laterality: N/A;  ? FOOT SURGERY Right   ? bone spur  ? POLYPECTOMY    ?  uterine  ? ? ?Home Medications:  ?Allergies as of 05/30/2021   ? ?   Reactions  ? Contrast Media [iodinated Contrast Media] Shortness Of Breath  ? Aspirin Nausea And Vomiting  ? Macrobid [nitrofurantoin Monohyd Macro] Hives, Nausea Only  ? ?  ? ?  ?Medication List  ?  ? ?  ? Accurate as of May 30, 2021 10:13 AM. If you have any questions, ask your nurse or doctor.  ?  ?  ? ?  ? ?albuterol 108 (90 Base) MCG/ACT inhaler ?Commonly known as: Ventolin HFA ?Inhale 2 puffs into the lungs every 4 (four) hours as needed for wheezing or shortness of breath. ?  ?alendronate 70 MG tablet ?Commonly known as: FOSAMAX ?Take 1 tablet (70 mg total) by mouth every 7 (seven) days. Take with a full glass of water on an empty stomach. ?  ?ALPRAZolam 1 MG tablet ?Commonly known as: Duanne Moron ?Take 1 tablet by mouth 2 (two) times daily. ?  ?Calcium Carb-Cholecalciferol 600-10 MG-MCG Tabs ?Take by mouth. ?  ?citalopram 10 MG tablet ?Commonly known as: CELEXA ?Take 30 mg by mouth daily. ?  ?ipratropium-albuterol 0.5-2.5 (3) MG/3ML Soln ?Commonly known as: DUONEB ?Take 3 mLs by nebulization every 6 (six) hours as needed (for coughing fits, wheeze, SOB). ?  ?  levocetirizine 5 MG tablet ?Commonly known as: XYZAL ?TAKE 1 TABLET BY MOUTH EVERY DAY IN THE EVENING ?  ?levothyroxine 50 MCG tablet ?Commonly known as: SYNTHROID ?TAKE 1 TABLET (50 MCG) BEFORE BREAKFAST X 5 DAYS A WEEK AND TAKE 1.5 TABLETS (75 MCG) BREAKFAST 2X A WEEK ?  ?nystatin powder ?Commonly known as: nystatin ?Apply topically to rash two to three times a day ?  ?oxybutynin 10 MG 24 hr tablet ?Commonly known as: DITROPAN-XL ?Take 1 tablet (10 mg total) by mouth at bedtime. ?  ?rosuvastatin 40 MG tablet ?Commonly known as: CRESTOR ?Take 1 tablet (40 mg total) by mouth daily. ?  ?Trelegy Ellipta 200-62.5-25 MCG/ACT Aepb ?Generic drug: Fluticasone-Umeclidin-Vilant ?Inhale 1 puff into the lungs daily. ?  ?triamcinolone ointment 0.1 % ?Commonly known as: KENALOG ?Apply 1 application  topically 2 (two) times daily as needed. ?  ?valsartan 80 MG tablet ?Commonly known as: DIOVAN ?TAKE 1 TABLET (80 MG TOTAL) BY MOUTH DAILY. FOR BLOOD PRESSURE ?  ?vitamin B-12 1000 MCG tablet ?Commonly known as: CYANOCOBALAMIN ?Take 1,000 mcg by mouth daily. ?  ? ?  ? ? ?Allergies:  ?Allergies  ?Allergen Reactions  ? Contrast Media [Iodinated Contrast Media] Shortness Of Breath  ? Aspirin Nausea And Vomiting  ? Macrobid [Nitrofurantoin Monohyd Macro] Hives and Nausea Only  ? ? ?Family History: ?Family History  ?Problem Relation Age of Onset  ? Alzheimer's disease Mother   ? Dementia Mother   ? Cancer Mother   ?     colon  ? Heart disease Mother   ? Hypertension Mother   ? Depression Mother   ? Alzheimer's disease Father   ? Dementia Father   ? Hypertension Father   ? Cancer Brother   ?     lung  ? Lung disease Brother   ? Hypertension Sister   ? Pneumonia Brother   ? Heart failure Brother   ? Breast cancer Neg Hx   ? ? ?Social History:  reports that she quit smoking about 30 years ago. Her smoking use included cigarettes. She has a 10.00 pack-year smoking history. She has never used smokeless tobacco. She reports that she does not drink alcohol and does not use drugs. ? ?ROS: ?  ? ?  ? ?  ? ?  ? ?  ? ?  ? ?  ? ?  ? ?  ? ?  ? ?  ? ?  ? ?  ? ?Physical Exam: ?There were no vitals taken for this visit.  ?Constitutional:  Alert and oriented, No acute distress. ?HEENT: [place] AT, moist mucus membranes.  Trachea midline, no masses. ?Cardiovascular: No clubbing, cyanosis, or edema. ?Respiratory: Normal respiratory effort, no increased work of breathing. ?GI: Abdomen is soft, nontender, nondistended, no abdominal masses ?GU: Grade 1 hypermobility the bladder neck no rest incontinence or prolapse ?Skin: No rashes, bruises or suspicious lesions. ?Lymph: No cervical or inguinal adenopathy. ?Neurologic: Grossly intact, no focal deficits, moving all 4 extremities. ?Psychiatric: Normal mood and affect. ? ?Laboratory Data: ?Lab Results   ?Component Value Date  ? WBC 7.6 04/13/2020  ? HGB 13.8 04/13/2020  ? HCT 42.0 04/13/2020  ? MCV 87.3 04/13/2020  ? PLT 267 04/13/2020  ? ? ?Lab Results  ?Component Value Date  ? CREATININE 0.70 04/19/2021  ? ? ?No results found for: PSA ? ?No results found for: TESTOSTERONE ? ?Lab Results  ?Component Value Date  ? HGBA1C 5.5 04/19/2021  ? ? ?Urinalysis ?   ?Component Value Date/Time  ?  COLORURINE YELLOW 04/19/2021 1528  ? APPEARANCEUR CLEAR 04/19/2021 1528  ? APPEARANCEUR Cloudy (A) 02/26/2015 1421  ? LABSPEC 1.015 04/19/2021 1528  ? PHURINE 5.5 04/19/2021 1528  ? GLUCOSEU NEGATIVE 04/19/2021 1528  ? HGBUR NEGATIVE 04/19/2021 1528  ? BILIRUBINUR neg 06/17/2019 1203  ? BILIRUBINUR Negative 02/26/2015 1421  ? [place] NEGATIVE 04/19/2021 1528  ? PROTEINUR NEGATIVE 04/19/2021 1528  ? UROBILINOGEN 0.2 06/17/2019 1203  ? NITRITE NEGATIVE 04/19/2021 1528  ? LEUKOCYTESUR NEGATIVE 04/19/2021 1528  ? ? ?Pertinent Imaging: ?Urine positive and sent for culture.  She did have microscopic hematuria.  Chart reviewed ? ?Assessment & Plan: Patient has milder mixed incontinence.  Role of urodynamics and cystoscopy discussed.  I will check again for microscopic hematuria especially if the culture is negative.  ? ?1. Urinary incontinence, unspecified type ? ?- Urinalysis, Complete ? ? ?No follow-ups on file. ? ?Reece Packer, MD ? ?[facility] ?[address] ?[place], [place] [address] ?[phone] ?  ?

## 2021-06-02 LAB — CULTURE, URINE COMPREHENSIVE

## 2021-06-29 ENCOUNTER — Other Ambulatory Visit: Payer: Self-pay | Admitting: Family Medicine

## 2021-06-29 DIAGNOSIS — I10 Essential (primary) hypertension: Secondary | ICD-10-CM

## 2021-07-03 ENCOUNTER — Other Ambulatory Visit: Payer: Self-pay | Admitting: Family Medicine

## 2021-08-01 ENCOUNTER — Ambulatory Visit: Payer: Medicare Other | Admitting: Family Medicine

## 2021-08-20 ENCOUNTER — Other Ambulatory Visit: Payer: Self-pay | Admitting: Family Medicine

## 2021-08-20 DIAGNOSIS — M816 Localized osteoporosis [Lequesne]: Secondary | ICD-10-CM

## 2021-08-26 ENCOUNTER — Other Ambulatory Visit: Payer: Self-pay | Admitting: Family Medicine

## 2021-08-26 DIAGNOSIS — J310 Chronic rhinitis: Secondary | ICD-10-CM

## 2021-08-26 NOTE — Telephone Encounter (Signed)
Requested Prescriptions  Pending Prescriptions Disp Refills  . levocetirizine (XYZAL) 5 MG tablet [Pharmacy Med Name: LEVOCETIRIZINE 5 MG TABLET] 90 tablet 3    Sig: TAKE 1 TABLET BY MOUTH EVERY DAY IN THE EVENING     Ear, Nose, and Throat:  Antihistamines - levocetirizine dihydrochloride Passed - 08/26/2021  2:20 AM      Passed - Cr in normal range and within 360 days    Creat  Date Value Ref Range Status  04/19/2021 0.70 0.50 - 1.05 mg/dL Final   Creatinine, Urine  Date Value Ref Range Status  04/19/2021 50 20 - 275 mg/dL Final         Passed - eGFR is 10 or above and within 360 days    GFR, Est African American  Date Value Ref Range Status  06/17/2019 85 > OR = 60 mL/min/1.32m Final   GFR, Est Non African American  Date Value Ref Range Status  06/17/2019 73 > OR = 60 mL/min/1.747mFinal   eGFR  Date Value Ref Range Status  04/19/2021 98 > OR = 60 mL/min/1.7366minal    Comment:    The eGFR is based on the CKD-EPI 2021 equation. To calculate  the new eGFR from a previous Creatinine or Cystatin C result, go to https://www.kidney.org/professionals/ kdoqi/gfr%5Fcalculator          Passed - Valid encounter within last 12 months    Recent Outpatient Visits          4 months ago [facility], unspecified [facility]   6 months [facility], [facility]   1 year ago Localized osteoporosis without current pathological fracture   [facility]   1 year ago [facility] physical [facility]   [facility]

## 2021-09-26 ENCOUNTER — Telehealth: Payer: Self-pay | Admitting: Family Medicine

## 2021-09-26 DIAGNOSIS — I10 Essential (primary) hypertension: Secondary | ICD-10-CM

## 2021-09-26 NOTE — Telephone Encounter (Signed)
Pt needs an appt 30 days given of bp med

## 2021-09-26 NOTE — Telephone Encounter (Signed)
Lvm for pt to call and schedule an appt  

## 2021-09-30 ENCOUNTER — Other Ambulatory Visit: Payer: Self-pay | Admitting: Family Medicine

## 2021-10-03 ENCOUNTER — Encounter: Payer: Self-pay | Admitting: Family Medicine

## 2021-10-03 ENCOUNTER — Ambulatory Visit (INDEPENDENT_AMBULATORY_CARE_PROVIDER_SITE_OTHER): Payer: Medicare Other | Admitting: Family Medicine

## 2021-10-03 VITALS — BP 126/74 | HR 70 | Temp 98.0°F | Resp 16 | Ht 61.0 in | Wt 132.9 lb

## 2021-10-03 DIAGNOSIS — Z23 Encounter for immunization: Secondary | ICD-10-CM

## 2021-10-03 DIAGNOSIS — Z114 Encounter for screening for human immunodeficiency virus [HIV]: Secondary | ICD-10-CM

## 2021-10-03 DIAGNOSIS — E039 Hypothyroidism, unspecified: Secondary | ICD-10-CM | POA: Diagnosis not present

## 2021-10-03 DIAGNOSIS — M816 Localized osteoporosis [Lequesne]: Secondary | ICD-10-CM

## 2021-10-03 DIAGNOSIS — Z5181 Encounter for therapeutic drug level monitoring: Secondary | ICD-10-CM

## 2021-10-03 DIAGNOSIS — I10 Essential (primary) hypertension: Secondary | ICD-10-CM | POA: Diagnosis not present

## 2021-10-03 DIAGNOSIS — J449 Chronic obstructive pulmonary disease, unspecified: Secondary | ICD-10-CM

## 2021-10-03 DIAGNOSIS — E559 Vitamin D deficiency, unspecified: Secondary | ICD-10-CM

## 2021-10-03 DIAGNOSIS — F331 Major depressive disorder, recurrent, moderate: Secondary | ICD-10-CM

## 2021-10-03 DIAGNOSIS — E782 Mixed hyperlipidemia: Secondary | ICD-10-CM

## 2021-10-03 DIAGNOSIS — R32 Unspecified urinary incontinence: Secondary | ICD-10-CM | POA: Insufficient documentation

## 2021-10-03 DIAGNOSIS — Z1159 Encounter for screening for other viral diseases: Secondary | ICD-10-CM

## 2021-10-03 DIAGNOSIS — M4696 Unspecified inflammatory spondylopathy, lumbar region: Secondary | ICD-10-CM

## 2021-10-03 MED ORDER — ALENDRONATE SODIUM 70 MG PO TABS: ORAL_TABLET | ORAL | 3 refills | Status: DC

## 2021-10-03 MED ORDER — MIRABEGRON ER 25 MG PO TB24: 25.0000 mg | ORAL_TABLET | Freq: Every day | ORAL | 11 refills | Status: DC

## 2021-10-03 MED ORDER — ROSUVASTATIN CALCIUM 40 MG PO TABS: 40.0000 mg | ORAL_TABLET | Freq: Every day | ORAL | 3 refills | Status: DC

## 2021-10-03 MED ORDER — TRELEGY ELLIPTA 200-62.5-25 MCG/ACT IN AEPB: 1.0000 | INHALATION_SPRAY | Freq: Every day | RESPIRATORY_TRACT | 11 refills | Status: DC

## 2021-10-03 MED ORDER — VALSARTAN 80 MG PO TABS: 80.0000 mg | ORAL_TABLET | Freq: Every day | ORAL | 3 refills | Status: DC

## 2021-10-03 NOTE — Assessment & Plan Note (Signed)
Pt managed with crestor 40 - she has been low/out, last labs were high, will send in refills, she previously tolerated, will have her take more consistently/daily and recheck labs in about 3-4 months  Lab Results  Component Value Date   CHOL 188 04/19/2021   HDL 44 (L) 04/19/2021   LDLCALC 127 (H) 04/19/2021   TRIG 78 04/19/2021   CHOLHDL 4.3 04/19/2021

## 2021-10-03 NOTE — Assessment & Plan Note (Signed)
phq 9 reviewed today, negative screening, she is on meds managed by specialists She endorses worse mood with winter months and holidays She may be able to increase dose to get her through winter, then resume current dose - she can discuss with Dr. Kasandra Knudsen

## 2021-10-03 NOTE — Assessment & Plan Note (Signed)
No currently seeing specialists or on meds

## 2021-10-03 NOTE — Assessment & Plan Note (Signed)
On fosamax and Vit D supplement, she has been working on other health, diet, weight, lifestyle

## 2021-10-03 NOTE — Progress Notes (Signed)
Name: Deborah Blackwell   MRN: [id number]    DOB: 04-14-1960   Date:10/03/2021       Progress Note  Chief Complaint  Patient presents with   Follow-up   Hypertension   Hyperlipidemia   Hypothyroidism   Diabetes     Subjective:   Deborah Blackwell is a 61 y.o. female, presents to clinic for routine f/up  Hypertension:  Currently managed on valsartan 80  Pt reports good med compliance and denies any SE.   Blood pressure today is well controlled. (BP rechecked at end of visit and lower and closer to her baseline) BP Readings from Last 3 Encounters:  10/03/21 136/74  05/30/21 119/76  04/19/21 118/68   Pt denies CP, SOB, exertional sx, LE edema, palpitation, Ha's, visual disturbances, lightheadedness, hypotension, syncope. Dietary efforts for BP?  Very healthy diet, healthier weight   HLD: crestor 40 - she has been low/out, last labs were high Lab Results  Component Value Date   CHOL 188 04/19/2021   HDL 44 (L) 04/19/2021   LDLCALC 127 (H) 04/19/2021   TRIG 78 04/19/2021   CHOLHDL 4.3 04/19/2021   Hx of DM and predm: last several years A1C in normal range w/o meds Recent pertinent labs: Lab Results  Component Value Date   HGBA1C 5.5 04/19/2021   HGBA1C 5.5 04/13/2020   HGBA1C 5.3 06/17/2019    Hypothyroid-  50 mcg 5d a week and 75 2 d a week Takes medicine correctly Current Symptoms: denies fatigue, weight changes, heat/cold intolerance, bowel/skin changes or CVS symptoms - mood consistently an issue Most recent results are below; we will be repeating labs today due to weight changes  Lab Results  Component Value Date   TSH 0.63 01/31/2021    MDD: Per specialists - Dr. Kasandra Knudsen on celexa    10/03/2021    1:31 PM 04/19/2021    2:39 PM 03/31/2021   10:19 AM  Depression screen PHQ 2/9  Decreased Interest '1 1 1  '$ Down, Depressed, Hopeless '1 1 1  '$ PHQ - 2 Score '2 2 2  '$ Altered sleeping 1 0 0  Tired, decreased energy '1 1 1  '$ Change in appetite 0 0 0  Feeling bad or failure  about yourself  0 0 0  Trouble concentrating 0 1 0  Moving slowly or fidgety/restless 0 0 1  Suicidal thoughts 0 0 0  PHQ-9 Score '4 4 4  '$ Difficult doing work/chores Not difficult at all Somewhat difficult Not difficult at all   COPD: On trelegy, no recent flares, overall well controlled respiratory sx  Urinary frequency and incontinence: Went to [facility] and did some testing, was referred to [facility] and then lost to f/up, visit this may, reviewed, do not see testing results or last A&P after testing Pt tried oxybutynin but is has not helped sx much and caused constipation   Current Outpatient Medications:    albuterol (VENTOLIN HFA) 108 (90 Base) MCG/ACT inhaler, Inhale 2 puffs into the lungs every 4 (four) hours as needed for wheezing or shortness of breath., Disp: 1 Inhaler, Rfl: 0   alendronate (FOSAMAX) 70 MG tablet, TAKE 1 TABLET BY MOUTH EVERY 7 (SEVEN) DAYS. TAKE WITH A FULL GLASS OF WATER ON AN EMPTY STOMACH., Disp: 12 tablet, Rfl: 0   ALPRAZolam (XANAX) 1 MG tablet, Take 1 tablet by mouth 2 (two) times daily., Disp: , Rfl: 2   Calcium Carb-Cholecalciferol 600-10 MG-MCG TABS, Take by mouth., Disp: , Rfl:    citalopram (  CELEXA) 10 MG tablet, Take 30 mg by mouth daily., Disp: , Rfl:    Fluticasone-Umeclidin-Vilant (TRELEGY ELLIPTA) 200-62.5-25 MCG/INH AEPB, Inhale 1 puff into the lungs daily., Disp: 28 each, Rfl: 1   ipratropium-albuterol (DUONEB) 0.5-2.5 (3) MG/3ML SOLN, Take 3 mLs by nebulization every 6 (six) hours as needed (for coughing fits, wheeze, SOB)., Disp: 180 mL, Rfl: 1   levocetirizine (XYZAL) 5 MG tablet, TAKE 1 TABLET BY MOUTH EVERY DAY IN THE EVENING, Disp: 90 tablet, Rfl: 2   levothyroxine (SYNTHROID) 50 MCG tablet, TAKE 1 TABLET (50 MCG) BEFORE BREAKFAST X 5 DAYS A WEEK AND TAKE 1.5 TABLETS (75 MCG) BEFORE BREAKFAST 2X A WEEK, Disp: 103 tablet, Rfl: 0   nystatin (NYSTATIN) powder, Apply topically to rash two to three times a day, Disp: 30 g, Rfl:  1   oxybutynin (DITROPAN-XL) 10 MG 24 hr tablet, Take 1 tablet (10 mg total) by mouth at bedtime., Disp: 90 tablet, Rfl: 0   rosuvastatin (CRESTOR) 40 MG tablet, Take 1 tablet (40 mg total) by mouth daily., Disp: 90 tablet, Rfl: 2   triamcinolone ointment (KENALOG) 0.1 %, Apply 1 application topically 2 (two) times daily as needed., Disp: 30 g, Rfl: 2   valsartan (DIOVAN) 80 MG tablet, TAKE 1 TABLET (80 MG TOTAL) BY MOUTH DAILY. 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[place]) 06/17/2019   Major depression, recurrent ([place]) 01/05/2017   Abnormal grief reaction 01/05/2017   Chronic pain syndrome 03/07/2016   Vitamin D deficiency 11/10/2015   Arthralgia 10/20/2015   Lumbar facet hypertrophy (Bilateral) 10/20/2015   Lumbar facet syndrome (Location of Primary Source of Pain) (Right) 10/20/2015   Lumbar spondylosis 10/20/2015   Chronic shoulder pain (Right) 10/18/2015   Chronic low back pain (Location of Primary Source of Pain) (Right) 10/18/2015   Chronic lower extremity pain (Location of Secondary source of pain) (Right) 10/18/2015   Occipital neuralgia (Right) 10/18/2015   Cervicogenic headache 10/18/2015   Chronic neck pain (Location of Tertiary source of pain) (Right) 09/30/2014   Cervical disc disorder with radiculopathy of cervical region 09/30/2014   Gout 08/07/2014   Hyperlipidemia 08/07/2014   Hypothyroidism 08/07/2014   Congenital scoliosis 08/07/2014   COPD (chronic obstructive pulmonary disease) ([place])    Hypertension     Past Surgical History:  Procedure Laterality Date   ABDOMINAL HYSTERECTOMY  2003   one ovary remains: due to heavy bleeding/endometrioma/cysts   BACK SURGERY      and injections   CARPAL TUNNEL RELEASE     COLONOSCOPY WITH PROPOFOL N/A 12/09/2019   Procedure: COLONOSCOPY WITH PROPOFOL;  Surgeon: [name], MD;  Location: [facility] ENDOSCOPY;  Service: Endoscopy;  Laterality: N/A;   FOOT SURGERY Right    bone spur   POLYPECTOMY     uterine    Family History  Problem Relation Age of Onset   Alzheimer's disease Mother    Dementia Mother    Cancer Mother        colon   Heart disease Mother    Hypertension Mother    Depression Mother    Alzheimer's disease Father    Dementia Father    Hypertension Father    Cancer Brother  lung   Lung disease Brother    Hypertension Sister    Pneumonia Brother    Heart failure Brother    Breast cancer Neg Hx     Social History   Tobacco Use   Smoking status: Former    Packs/day: 2.00    Years: 5.00    Total pack years: 10.00    Types: Cigarettes    Quit date: 02/02/1991    Years since quitting: 30.6   Smokeless tobacco: Never  Vaping Use   Vaping Use: Never used  Substance Use Topics   Alcohol use: No    Alcohol/week: 0.0 standard drinks of alcohol   Drug use: No     Allergies  Allergen Reactions   Contrast Media [Iodinated Contrast Media] Shortness Of Breath   Aspirin Nausea And Vomiting   Macrobid [Nitrofurantoin Monohyd Macro] Hives and Nausea Only    Health Maintenance  Topic Date Due   Hepatitis C Screening  Never done   OPHTHALMOLOGY EXAM  10/03/2018   FOOT EXAM  04/13/2021   COVID-19 Vaccine (4 - Moderna series) 10/19/2021 (Originally 02/03/2020)   Zoster Vaccines- Shingrix (1 of 2) 01/02/2022 (Originally 07/18/2008)   TETANUS/TDAP  04/20/2022 (Originally 11/11/2019)   HEMOGLOBIN A1C  10/19/2021   MAMMOGRAM  11/09/2021   Diabetic kidney evaluation - GFR measurement  04/20/2022   Diabetic kidney evaluation - Urine ACR  04/20/2022   DEXA SCAN  05/25/2022   COLONOSCOPY (Pts 45-29yr Insurance coverage will need to be confirmed)  12/08/2024   INFLUENZA VACCINE  Completed    HIV Screening  Completed   HPV VACCINES  Aged Out   PAP SMEAR-Modifier  Discontinued    Chart Review Today: I personally reviewed active problem list, medication list, allergies, family history, social history, health maintenance, notes from last encounter, lab results, imaging with the patient/caregiver today.   Review of Systems  Constitutional: Negative.   HENT: Negative.    Eyes: Negative.   Respiratory: Negative.    Cardiovascular: Negative.   Gastrointestinal: Negative.   Endocrine: Negative.   Genitourinary: Negative.   Musculoskeletal: Negative.   Skin: Negative.   Allergic/Immunologic: Negative.   Neurological: Negative.   Hematological: Negative.   Psychiatric/Behavioral: Negative.    All other systems reviewed and are negative.    Objective:   Vitals:   10/03/21 1342 10/03/21 1423  BP: 136/74 126/74  Pulse: 70   Resp: 16   Temp: 98 F (36.7 C)   TempSrc: Oral   SpO2: 97%   Weight: 132 lb 14.4 oz (60.3 kg)   Height: '5\' 1"'$  (1.549 m)     Body mass index is 25.11 kg/m.  Physical Exam Vitals and nursing note reviewed.  Constitutional:      General: She is not in acute distress.    Appearance: Normal appearance. She is well-developed, well-groomed and normal weight. She is not ill-appearing, toxic-appearing or diaphoretic.  HENT:     Head: Normocephalic and atraumatic.     Right Ear: External ear normal.     Left Ear: External ear normal.     Nose: Nose normal.     Mouth/Throat:     Pharynx: Oropharynx is clear.  Eyes:     General: No scleral icterus.       Right eye: No discharge.        Left eye: No discharge.     Conjunctiva/sclera: Conjunctivae normal.  Cardiovascular:     Rate and Rhythm: Normal rate and regular rhythm.  Pulses: Normal pulses.     Heart sounds: Normal heart sounds. No murmur heard.    No friction rub. No gallop.  Pulmonary:     Effort: Pulmonary effort is normal. No respiratory distress.     Breath sounds: Normal  breath sounds. No stridor. No wheezing or rales.  Abdominal:     General: Bowel sounds are normal.     Palpations: Abdomen is soft.  Musculoskeletal:     Right lower leg: No edema.     Left lower leg: No edema.  Skin:    General: Skin is warm and dry.     Capillary Refill: Capillary refill takes less than 2 seconds.     Coloration: Skin is not jaundiced or pale.     Findings: No erythema or rash.  Neurological:     Mental Status: She is alert. Mental status is at baseline.     Gait: Gait normal.  Psychiatric:        Mood and Affect: Mood normal.        Behavior: Behavior normal. Behavior is cooperative.         Assessment & Plan:   Problem List Items Addressed This Visit       Cardiovascular and Mediastinum   Hypertension - Primary (Chronic)    Currently managed on valsartan 80  Pt reports good med compliance and denies any SE.   Blood pressure today is well controlled. (BP rechecked at end of visit and lower and closer to her baseline) BP Readings from Last 3 Encounters:  10/03/21 126/74  05/30/21 119/76  04/19/21 118/68  continue same med and diet/lifestyle efforts       Relevant Medications   rosuvastatin (CRESTOR) 40 MG tablet   valsartan (DIOVAN) 80 MG tablet   Other Relevant Orders   COMPLETE METABOLIC PANEL WITH GFR     Respiratory   COPD (chronic obstructive pulmonary disease) (HCC) (Chronic)    Sx stable and well controlled on trelegy, she has rescue meds available, no recent exacerbations or hospital visits Refills sent in for maintenance inhaler      Relevant Medications   Fluticasone-Umeclidin-Vilant (TRELEGY ELLIPTA) 200-62.5-25 MCG/ACT AEPB     Endocrine   Hypothyroidism (Chronic)    No recent dose changes 50 mcg 5 d a week and 75 mcg 2 d a week Lab Results  Component Value Date   TSH 0.63 01/31/2021  recheck labs with low end of normal labs and decreased weight and will change med dose if needed otherwise continue same dosing        Relevant Orders   TSH   CBC with Differential/Platelet     Musculoskeletal and Integument   Unspecified inflammatory spondylopathy, 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[place])    No currently seeing specialists or on meds      Osteoporosis    On fosamax and Vit D supplement, she has been working on other health, diet, weight, lifestyle      Relevant Medications   alendronate (FOSAMAX) 70 MG tablet     Other   Hyperlipidemia (Chronic)    Pt managed with crestor 40 - she has been low/out, last labs were high, will send in refills, she previously tolerated, will have her take more consistently/daily and recheck labs in about 3-4 months  Lab Results  Component Value Date   CHOL 188 04/19/2021   HDL 44 (L) 04/19/2021   LDLCALC 127 (H) 04/19/2021   TRIG 78 04/19/2021   CHOLHDL 4.3 04/19/2021  Relevant Medications   rosuvastatin (CRESTOR) 40 MG tablet   valsartan (DIOVAN) 80 MG tablet   Other Relevant Orders   COMPLETE METABOLIC PANEL WITH GFR   Vitamin D deficiency    Last labs Vit D optimal, she has continued supplements and fosamax      Major depression, recurrent (HCC)    phq 9 reviewed today, negative screening, she is on meds managed by specialists She endorses worse mood with winter months and holidays She may be able to increase dose to get her through winter, then resume current dose - she can discuss with Dr. Kasandra Knudsen      Urinary incontinence    Pt lost to f/up She will call [facility] to see about prior referral and appt, will let us know if anything is needed from primary Continued incontinence Oxybutynin didn't help much Will send in myrbetrique to see if we can get covered She reports testing showed PVR      Relevant Medications   mirabegron ER (MYRBETRIQ) 25 MG TB24 tablet   Other Relevant Orders   Ambulatory referral to Urology   Other Visit Diagnoses     Need for hepatitis C screening test       Relevant Orders   Hepatitis C antibody   Screening for HIV without  presence of risk factors       Relevant Orders   HIV Antibody (routine testing w rflx)   Need for shingles vaccine       declined - offered to send to pharmacy   Need for influenza vaccination       done today   Relevant Orders   Flu Vaccine QUAD 82moIM (Fluarix, Fluzone & Alfiuria Quad PF) (Completed)   Encounter for medication monitoring       Relevant Orders   COMPLETE METABOLIC PANEL WITH GFR   TSH   CBC with Differential/Platelet        Return for 4 month f/up - HLD labs, HTN.   LDelsa Grana PA-C 10/03/21 1:57 PM

## 2021-10-03 NOTE — Assessment & Plan Note (Signed)
Pt lost to f/up She will call [facility] to see about prior referral and appt, will let us know if anything is needed from primary Continued incontinence Oxybutynin didn't help much Will send in myrbetrique to see if we can get covered She reports testing showed PVR

## 2021-10-03 NOTE — Assessment & Plan Note (Signed)
Currently managed on valsartan 80  Pt reports good med compliance and denies any SE.   Blood pressure today is well controlled. (BP rechecked at end of visit and lower and closer to her baseline) BP Readings from Last 3 Encounters:  10/03/21 126/74  05/30/21 119/76  04/19/21 118/68  continue same med and diet/lifestyle efforts

## 2021-10-03 NOTE — Assessment & Plan Note (Signed)
Sx stable and well controlled on trelegy, she has rescue meds available, no recent exacerbations or hospital visits Refills sent in for maintenance inhaler

## 2021-10-03 NOTE — Assessment & Plan Note (Signed)
No recent dose changes 50 mcg 5 d a week and 75 mcg 2 d a week Lab Results  Component Value Date   TSH 0.63 01/31/2021  recheck labs with low end of normal labs and decreased weight and will change med dose if needed otherwise continue same dosing

## 2021-10-03 NOTE — Assessment & Plan Note (Signed)
Last labs Vit D optimal, she has continued supplements and fosamax

## 2021-10-04 LAB — COMPLETE METABOLIC PANEL WITH GFR
AG Ratio: 1.3 (calc) (ref 1.0–2.5)
ALT: 10 U/L (ref 6–29)
AST: 18 U/L (ref 10–35)
Albumin: 3.9 g/dL (ref 3.6–5.1)
Alkaline phosphatase (APISO): 50 U/L (ref 37–153)
BUN: 16 mg/dL (ref 7–25)
CO2: 29 mmol/L (ref 20–32)
Calcium: 9.4 mg/dL (ref 8.6–10.4)
Chloride: 105 mmol/L (ref 98–110)
Creat: 0.77 mg/dL (ref 0.50–1.05)
Globulin: 3 g/dL (calc) (ref 1.9–3.7)
Glucose, Bld: 81 mg/dL (ref 65–99)
Potassium: 4.6 mmol/L (ref 3.5–5.3)
Sodium: 141 mmol/L (ref 135–146)
Total Bilirubin: 0.4 mg/dL (ref 0.2–1.2)
Total Protein: 6.9 g/dL (ref 6.1–8.1)
eGFR: 87 mL/min/{1.73_m2} (ref >=60)

## 2021-10-04 LAB — CBC WITH DIFFERENTIAL/PLATELET
Absolute Monocytes: 462 cells/uL (ref 200–950)
Basophils Absolute: 20 cells/uL (ref 0–200)
Basophils Relative: 0.3 %
Eosinophils Absolute: 191 cells/uL (ref 15–500)
Eosinophils Relative: 2.9 %
HCT: 38.5 % (ref 35.0–45.0)
Hemoglobin: 12.7 g/dL (ref 11.7–15.5)
Lymphs Abs: 2528 cells/uL (ref 850–3900)
MCH: 29.5 pg (ref 27.0–33.0)
MCHC: 33 g/dL (ref 32.0–36.0)
MCV: 89.3 fL (ref 80.0–100.0)
MPV: 10.5 fL (ref 7.5–12.5)
Monocytes Relative: 7 %
Neutro Abs: 3399 cells/uL (ref 1500–7800)
Neutrophils Relative %: 51.5 %
Platelets: 255 10*3/uL (ref 140–400)
RBC: 4.31 10*6/uL (ref 3.80–5.10)
RDW: 12 % (ref 11.0–15.0)
Total Lymphocyte: 38.3 %
WBC: 6.6 10*3/uL (ref 3.8–10.8)

## 2021-10-04 LAB — HEPATITIS C ANTIBODY: Hepatitis C Ab: NONREACTIVE

## 2021-10-04 LAB — TSH: TSH: 0.47 mIU/L (ref 0.40–4.50)

## 2021-10-04 LAB — HIV ANTIBODY (ROUTINE TESTING W REFLEX): HIV 1&2 Ab, 4th Generation: NONREACTIVE

## 2021-10-05 ENCOUNTER — Telehealth: Payer: Self-pay | Admitting: Family Medicine

## 2021-10-05 NOTE — Telephone Encounter (Signed)
Patient returning call regarding lab results, please leave a detail voicemail  Copied from [place] [phone]. Topic: Quick Communication - Lab Results (Clinic Use ONLY) >> Oct 05, 2021  8:35 AM Maricela R wrote: Called patient to inform them of recent lab results. When patient returns call, triage nurse may disclose results.

## 2021-10-05 NOTE — Telephone Encounter (Signed)
Called, spoke with patient about lab results, she verbalized understanding.

## 2021-10-24 ENCOUNTER — Telehealth: Payer: Self-pay | Admitting: Family Medicine

## 2021-10-24 ENCOUNTER — Telehealth: Payer: Self-pay | Admitting: Urology

## 2021-10-24 DIAGNOSIS — Z1231 Encounter for screening mammogram for malignant neoplasm of breast: Secondary | ICD-10-CM

## 2021-10-24 DIAGNOSIS — N644 Mastodynia: Secondary | ICD-10-CM

## 2021-10-24 DIAGNOSIS — N6320 Unspecified lump in the left breast, unspecified quadrant: Secondary | ICD-10-CM

## 2021-10-24 NOTE — Telephone Encounter (Signed)
Patient called the office today to find out the results of her urodynamics study at [facility] and schedule a follow up.  Can you please look to see if we have the results and if so, schedule a follow up with Dr. Matilde Sprang to discuss?  Thank you

## 2021-10-24 NOTE — Telephone Encounter (Signed)
Copied from [place] [id number]. Topic: Referral - Request for Referral >> Oct 24, 2021  2:06 PM Chapman Fitch wrote: Has patient seen PCP for this complaint? Yes  *If NO, is insurance requiring patient see PCP for this issue before PCP can refer them? Referral for which specialty: mammogram  Preferred provider/office: [facility] [place] / now in the [facility] specialty clinic building  Reason for referral: pt received a letter it is time for another mammogram and ultrasound and she needs a referral

## 2021-10-24 NOTE — Telephone Encounter (Signed)
Orders have been put in and pt is aware.

## 2021-10-25 ENCOUNTER — Encounter: Payer: Self-pay | Admitting: Urology

## 2021-11-23 ENCOUNTER — Ambulatory Visit
Admission: RE | Admit: 2021-11-23 | Discharge: 2021-11-23 | Disposition: A | Discharge status: Home / Self Care | Payer: Medicare Other | Source: Ambulatory Visit | Attending: Family Medicine | Admitting: Family Medicine

## 2021-11-23 DIAGNOSIS — Z1231 Encounter for screening mammogram for malignant neoplasm of breast: Secondary | ICD-10-CM | POA: Insufficient documentation

## 2021-11-23 DIAGNOSIS — N6320 Unspecified lump in the left breast, unspecified quadrant: Secondary | ICD-10-CM

## 2021-11-23 DIAGNOSIS — N644 Mastodynia: Secondary | ICD-10-CM | POA: Diagnosis present

## 2021-12-05 ENCOUNTER — Ambulatory Visit: Payer: Medicare Other | Admitting: Urology

## 2021-12-05 VITALS — BP 123/78 | HR 77 | Ht 61.0 in | Wt 134.0 lb

## 2021-12-05 DIAGNOSIS — N3946 Mixed incontinence: Secondary | ICD-10-CM | POA: Diagnosis not present

## 2021-12-05 LAB — URINALYSIS, COMPLETE
Bilirubin, UA: NEGATIVE
Glucose, UA: NEGATIVE
Ketones, UA: NEGATIVE
Nitrite, UA: NEGATIVE
Protein,UA: NEGATIVE
Specific Gravity, UA: 1.015 (ref 1.005–1.030)
Urobilinogen, Ur: 2 mg/dL — ABNORMAL HIGH (ref 0.2–1.0)
pH, UA: 7 (ref 5.0–7.5)

## 2021-12-05 LAB — MICROSCOPIC EXAMINATION

## 2021-12-05 MED ORDER — GEMTESA 75 MG PO TABS: 75.0000 mg | ORAL_TABLET | Freq: Every day | ORAL | 11 refills | Status: DC

## 2021-12-05 MED ORDER — GEMTESA 75 MG PO TABS: 75.0000 mg | ORAL_TABLET | Freq: Every day | ORAL | 0 refills | Status: DC

## 2021-12-05 MED ORDER — CIPROFLOXACIN HCL 250 MG PO TABS: 250.0000 mg | ORAL_TABLET | Freq: Two times a day (BID) | ORAL | 0 refills | Status: DC

## 2021-12-05 NOTE — Progress Notes (Signed)
12/05/2021 11:05 AM   Deborah Blackwell December 12, 1960 [id number]  Referring provider: Delsa Grana, PA-C [address] [place],  [place] [address]  Chief Complaint  Patient presents with   Results    HPI: I was consulted to assess the patient's urinary continence. She leaks with coughing sneezing or walking is the primary symptom. Sometimes she has urge incontinence. She has no bedwetting. She wears 2 pads a day that are damp  She voids every 2 hours gets up twice at night. Flow varies  She started oxybutynin 2 weeks ago and she is going more frequently but still leaks with nocturia  Has had a hysterectomy. Back injections lower back     Grade 1 hypermobility the bladder neck no rest incontinence or prolapse   Patient has milder mixed incontinence.  Role of urodynamics and cystoscopy discussed.  I will check again for microscopic hematuria especially if the culture is negative.   Today Frequency stable.  Incontinence stable.  I have not seen the patient since May 2023. On urodynamics she initially voided 65 mL with a maximal flow of 8 mils per second.  It was a depressed flow pattern.  Her residual was a few milliliters.  Maximum bladder capacity was 224 mL.  She had increased bladder sensation.  She had an unstable bladder reaching pressure 13 cm of water.  There may have been some triggering with the coughing.  She did not feel urgency and she did not leak.  She had no stress incontinence with a Valsalva pressure of 114 cm of water.  During voiding she did generate a voluntary contraction.  She voided 213 mL.  Maximum voiding pressure 13 to 18 cm of water.  Flow rate was 14 mils per second.  Residual was 10 mL.  It was a mildly prolonged pattern.  EMG activity within normal limits.  No contrast given due to allergy.    PMH: Past Medical History:  Diagnosis Date   Angina at rest [facility])    Arthritis    Asthma    Back pain with right-sided sciatica 09/30/2014   Bipolar  disorder ([place])    Congenital scoliosis    COPD (chronic obstructive pulmonary disease) (HCC)    DDD (degenerative disc disease), lumbar    Depression    Elevated C-reactive protein (CRP) 11/10/2015   GERD (gastroesophageal reflux disease)    Gout    History of diabetes mellitus    previously on medication, removed once pt lost weight, A1c WNL > 2 yrs   History of kidney stones    History of scoliosis    was casted as a child   Hyperlipidemia    Hypertension    Lumbar radiculopathy 09/30/2014   Migraines    Panic attack    RA (rheumatoid arthritis) ([place])    Type 2 diabetes, controlled, with neuropathy ([place]) 09/30/2014    Surgical History: Past Surgical History:  Procedure Laterality Date   ABDOMINAL HYSTERECTOMY  2003   one ovary remains: due to heavy bleeding/endometrioma/cysts   BACK SURGERY     and injections   CARPAL TUNNEL RELEASE     COLONOSCOPY WITH PROPOFOL N/A 12/09/2019   Procedure: COLONOSCOPY WITH PROPOFOL;  Surgeon: [name], MD;  Location: [facility] ENDOSCOPY;  Service: Endoscopy;  Laterality: N/A;   FOOT SURGERY Right    bone spur   POLYPECTOMY     uterine    Home Medications:  Allergies as of 12/05/2021       Reactions  Contrast Media [iodinated Contrast Media] Shortness Of Breath   Aspirin Nausea And Vomiting   Macrobid [nitrofurantoin Monohyd Macro] Hives, Nausea Only        Medication List        Accurate as of December 05, 2021 11:05 AM. If you have any questions, ask your nurse or doctor.          albuterol 108 (90 Base) MCG/ACT inhaler Commonly known as: Ventolin HFA Inhale 2 puffs into the lungs every 4 (four) hours as needed for wheezing or shortness of breath.   alendronate 70 MG tablet Commonly known as: FOSAMAX TAKE 1 TABLET BY MOUTH EVERY 7 (SEVEN) DAYS. TAKE WITH A FULL GLASS OF WATER ON AN EMPTY STOMACH.   ALPRAZolam 1 MG tablet Commonly known as: XANAX Take 1 tablet by mouth 2 (two) times daily.   Calcium  Carb-Cholecalciferol 600-10 MG-MCG Tabs Take by mouth.   citalopram 10 MG tablet Commonly known as: CELEXA Take 30 mg by mouth daily.   cyanocobalamin 1000 MCG tablet Commonly known as: VITAMIN B12 Take 1,000 mcg by mouth daily.   ipratropium-albuterol 0.5-2.5 (3) MG/3ML Soln Commonly known as: DUONEB Take 3 mLs by nebulization every 6 (six) hours as needed (for coughing fits, wheeze, SOB).   levocetirizine 5 MG tablet Commonly known as: XYZAL TAKE 1 TABLET BY MOUTH EVERY DAY IN THE EVENING   levothyroxine 50 MCG tablet Commonly known as: SYNTHROID TAKE 1 TABLET (50 MCG) BEFORE BREAKFAST X 5 DAYS A WEEK AND TAKE 1.5 TABLETS (75 MCG) BEFORE BREAKFAST 2X A WEEK   mirabegron ER 25 MG Tb24 tablet Commonly known as: MYRBETRIQ Take 1 tablet (25 mg total) by mouth daily.   nystatin powder Commonly known as: nystatin Apply topically to rash two to three times a day   rosuvastatin 40 MG tablet Commonly known as: CRESTOR Take 1 tablet (40 mg total) by mouth daily. For cholesterol   Trelegy Ellipta 200-62.5-25 MCG/ACT Aepb Generic drug: Fluticasone-Umeclidin-Vilant Inhale 1 puff into the lungs daily. Consistent time of day for COPD   triamcinolone ointment 0.1 % Commonly known as: KENALOG Apply 1 application topically 2 (two) times daily as needed.   valsartan 80 MG tablet Commonly known as: DIOVAN Take 1 tablet (80 mg total) by mouth daily. For blood pressure        Allergies:  Allergies  Allergen Reactions   Contrast Media [Iodinated Contrast Media] Shortness Of Breath   Aspirin Nausea And Vomiting   Macrobid [Nitrofurantoin Monohyd Macro] Hives and Nausea Only    Family History: Family History  Problem Relation Age of Onset   Alzheimer's disease Mother    Dementia Mother    Cancer Mother        colon   Heart disease Mother    Hypertension Mother    Depression Mother    Alzheimer's disease Father    Dementia Father    Hypertension Father    Cancer  Brother        lung   Lung disease Brother    Hypertension Sister    Pneumonia Brother    Heart failure Brother    Breast cancer Neg Hx     Social History:  reports that she quit smoking about 30 years ago. Her smoking use included cigarettes. She has a 10.00 pack-year smoking history. She has never used smokeless tobacco. She reports that she does not drink alcohol and does not use drugs.  ROS:  Physical Exam: BP 123/78   Pulse 77   Ht '5\' 1"'$  (1.549 m)   Wt 60.8 kg   BMI 25.32 kg/m   Constitutional:  Alert and oriented, No acute distress. HEENT: [place] AT, moist mucus membranes.  Trachea midline, no masses.  Laboratory Data: Lab Results  Component Value Date   WBC 6.6 10/03/2021   HGB 12.7 10/03/2021   HCT 38.5 10/03/2021   MCV 89.3 10/03/2021   PLT 255 10/03/2021    Lab Results  Component Value Date   CREATININE 0.77 10/03/2021    No results found for: "PSA"  No results found for: "TESTOSTERONE"  Lab Results  Component Value Date   HGBA1C 5.5 04/19/2021    Urinalysis    Component Value Date/Time   COLORURINE YELLOW 04/19/2021 1528   APPEARANCEUR Clear 05/30/2021 1003   LABSPEC 1.015 04/19/2021 1528   PHURINE 5.5 04/19/2021 1528   GLUCOSEU Negative 05/30/2021 1003   HGBUR NEGATIVE 04/19/2021 1528   BILIRUBINUR Negative 05/30/2021 1003   KETONESUR NEGATIVE 04/19/2021 1528   PROTEINUR Negative 05/30/2021 1003   PROTEINUR NEGATIVE 04/19/2021 1528   UROBILINOGEN 0.2 06/17/2019 1203   NITRITE Negative 05/30/2021 1003   NITRITE NEGATIVE 04/19/2021 1528   LEUKOCYTESUR 3+ (A) 05/30/2021 1003   LEUKOCYTESUR NEGATIVE 04/19/2021 1528    Pertinent Imaging: Urine reviewed and sent for culture  Assessment & Plan: Patient has mild mixed incontinence.  She has a high leak point pressure which would not necessarily explain why she leaks with walking.  I will try to help her with medical therapy and I  recommended physical therapy today.  She thought she was infected today so I sent her urine for culture.  I will have her come back on Myrbetriq 50 mg samples and prescription and do cystoscopy on that day.  I will do another cough test.  We will proceed accordingly perhaps urethral bulking agent might be a good option for her stress component  She think she has a UTI with burning and itching.  Culture sent and I sent in ciprofloxacin 250 mg twice a day for 7 days.  Still had blood in the urine today  She is currently on Myrbetriq as a partial responder.  It worked better than the oxybutynin.  She still said going frequently is bothering her and she still leaking some.  I will see her back for pelvic semination cystoscopy in 6 weeks on Gemtesa samples and prescription.  She understands that she still has microscopic hematuria I will order a CT scan.  She might consider physical therapy in the future  1. Mixed incontinence  - Urinalysis, Complete   No follow-ups on file.  Reece Packer, MD  [facility] [address] [place], [place] [address] [phone]

## 2021-12-07 LAB — CULTURE, URINE COMPREHENSIVE

## 2021-12-12 ENCOUNTER — Other Ambulatory Visit: Payer: Self-pay | Admitting: *Deleted

## 2021-12-12 MED ORDER — CEPHALEXIN 500 MG PO CAPS: 500.0000 mg | ORAL_CAPSULE | Freq: Three times a day (TID) | ORAL | 0 refills | Status: AC

## 2021-12-25 ENCOUNTER — Other Ambulatory Visit: Payer: Self-pay | Admitting: Family Medicine

## 2022-01-30 ENCOUNTER — Other Ambulatory Visit: Payer: Medicare Other | Admitting: Urology

## 2022-02-02 ENCOUNTER — Ambulatory Visit (INDEPENDENT_AMBULATORY_CARE_PROVIDER_SITE_OTHER): Payer: Medicare HMO | Admitting: Nurse Practitioner

## 2022-02-02 ENCOUNTER — Other Ambulatory Visit: Payer: Self-pay

## 2022-02-02 ENCOUNTER — Encounter: Payer: Self-pay | Admitting: Nurse Practitioner

## 2022-02-02 VITALS — BP 126/82 | HR 83 | Temp 98.4°F | Resp 16 | Ht 61.0 in | Wt 130.1 lb

## 2022-02-02 DIAGNOSIS — E782 Mixed hyperlipidemia: Secondary | ICD-10-CM

## 2022-02-02 DIAGNOSIS — J449 Chronic obstructive pulmonary disease, unspecified: Secondary | ICD-10-CM | POA: Diagnosis not present

## 2022-02-02 DIAGNOSIS — I10 Essential (primary) hypertension: Secondary | ICD-10-CM | POA: Diagnosis not present

## 2022-02-02 DIAGNOSIS — E039 Hypothyroidism, unspecified: Secondary | ICD-10-CM

## 2022-02-02 DIAGNOSIS — E1140 Type 2 diabetes mellitus with diabetic neuropathy, unspecified: Secondary | ICD-10-CM | POA: Diagnosis not present

## 2022-02-02 DIAGNOSIS — M816 Localized osteoporosis [Lequesne]: Secondary | ICD-10-CM | POA: Diagnosis not present

## 2022-02-02 DIAGNOSIS — R69 Illness, unspecified: Secondary | ICD-10-CM | POA: Diagnosis not present

## 2022-02-02 DIAGNOSIS — F331 Major depressive disorder, recurrent, moderate: Secondary | ICD-10-CM

## 2022-02-02 MED ORDER — NYSTATIN 100000 UNIT/GM EX POWD: CUTANEOUS | 1 refills | Status: AC

## 2022-02-02 NOTE — Assessment & Plan Note (Addendum)
Continue fosamax, vitamin d and calcium supplement. Continue weight bearing exercises.

## 2022-02-02 NOTE — Progress Notes (Signed)
BP 126/82   Pulse 83   Temp 98.4 F (36.9 C) (Oral)   Resp 16   Ht '5\' 1"'$  (1.549 m)   Wt 130 lb 1.6 oz (59 kg)   SpO2 97%   BMI 24.58 kg/m    Subjective:    Patient ID: Deborah Blackwell, female    DOB: 04/05/1960, 62 y.o.   MRN: [id number]  HPI: Deborah Blackwell is a 62 y.o. female  Chief Complaint  Patient presents with   Hypertension   Hyperlipidemia   Hypothyroidism    4 month follow up   HTN: her blood pressure today is 126/82.  She is currently taking valsartan 80 mg daily.  She denies any chest pain, shortness of breath, headaches or blurred vision.  Hypothyroidism: She is currently taking levothyroxine 50 mcg daily 5 days a week and taking 75 mcg 2 days a week.  Her last TSH was 0.47 on 10/03/2021.  She denies any constipation, palpitations or fatigue.  Diabetes: Her last A1c was 5.5 on 04/19/2021.  The last few times patient's A1c has been normal range and she is not currently on diabetic medication.  Hyperlipidemia: Her last LDL was 127 on 04/19/2021.  Rosuvastatin 40 mg daily.  She denies any myalgia. The 10-year ASCVD risk score (Arnett DK, et al., 2019) is: 11.9%   Values used to calculate the score:     Age: 78 years     Sex: Female     Is Non-Hispanic African American: No     Diabetic: Yes     Tobacco smoker: No     Systolic Blood Pressure: 449 mmHg     Is BP treated: Yes     HDL Cholesterol: 44 mg/dL     Total Cholesterol: 188 mg/dL   Depression: Patient currently sees Dr. Kasandra Kn[facility].  She is currently on celexa 30 mg daily. She says she is doing well, she says she did struggle during the holidays.  She says she tries to stay busy.     02/02/2022    1:30 PM 10/03/2021    1:31 PM 04/19/2021    2:39 PM 03/31/2021   10:19 AM 01/31/2021   10:14 AM  Depression screen PHQ 2/9  Decreased Interest '1 1 1 1 '$ 0  Down, Depressed, Hopeless '1 1 1 1 '$ 0  PHQ - 2 Score '2 2 2 2 '$ 0  Altered sleeping 1 1 0 0 0  Tired, decreased energy '1 1 1 1 '$ 0  Change in appetite 0 0 0 0 0   Feeling bad or failure about yourself  0 0 0 0 0  Trouble concentrating 0 0 1 0 0  Moving slowly or fidgety/restless 1 0 0 1 0  Suicidal thoughts 0 0 0 0 0  PHQ-9 Score '5 4 4 4 '$ 0  Difficult doing work/chores Somewhat difficult Not difficult at all Somewhat difficult Not difficult at all Not difficult at all    COPD:  currently on Trelegy.  She says she has been doing really well.   Osteoporosis: she is currently taking fosamax, calcium and vitamin d. Doing weight bearing exercises.  Narrative & Impression  EXAM: DUAL X-RAY ABSORPTIOMETRY (DXA) FOR BONE MINERAL DENSITY   IMPRESSION: Your patient Deborah Blackwell completed a BMD test on 05/24/2020 using the [employer] (software version: 14.10) manufactured by [employer]. The following summarizes the results of our evaluation. Technologist: ecj PATIENT BIOGRAPHICAL: Name: Deborah, Blackwell Patient ID: [id number] Birth Date: 01-14-1961  Height: 61.0 in. Gender: Female Exam Date: 05/24/2020 Weight: 143.9 lbs. Indications: Caucasian, COPD, Diabetic, Hypothyroid, Hysterectomy, Vitamin D Deficiency Fractures: Treatments: Albuterol, Flonase, Synthroid DENSITOMETRY RESULTS: Site      Region    Measured Date Measured Age WHO Classification Young Adult T-score BMD         %Change vs. Previous Significant Change (*) AP Spine L1-L4 05/24/2020 61.8 Normal -0.8 1.097 g/cm2 - -   DualFemur Neck Left 05/24/2020 61.8 Osteoporosis -2.5 0.694 g/cm2 - - ASSESSMENT: The BMD measured at Femur Neck Left is 0.694 g/cm2 with a T-score of -2.5. This patient is considered osteoporotic according to [facility]) criteria. The scan quality is good.   World Pharmacologist [facility]) criteria for post-menopausal, Caucasian Women: Normal:                   T-score at or above -1 SD Osteopenia/low bone mass: T-score between -1 and -2.5 SD Osteoporosis:             T-score at or below -2.5 SD   RECOMMENDATIONS: 1. All  patients should optimize calcium and vitamin D intake. 2. Consider FDA-approved medical therapies in postmenopausal women and men aged 90 years and older, based on the following: a. A hip or vertebral(clinical or morphometric) fracture b. T-score < -2.5 at the femoral neck or spine after appropriate evaluation to exclude secondary causes c. Low bone mass (T-score between -1.0 and -2.5 at the femoral neck or spine) and a 10-year probability of a hip fracture > 3% or a 10-year probability of a major osteoporosis-related fracture > 20% based on the [place]-adapted WHO algorithm 3. Clinician judgment and/or patient preferences may indicate treatment for people with 10-year fracture probabilities above or below these levels FOLLOW-UP: People with diagnosed cases of osteoporosis or at high risk for fracture should have regular bone mineral density tests. For patients eligible for Medicare, routine testing is allowed once every 2 years. The testing frequency can be increased to one year for patients who have rapidly progressing disease, those who are receiving or discontinuing medical therapy to restore bone mass, or have additional risk factors.   I have reviewed this report, and agree with the above findings.   [facility]    Relevant past medical, surgical, family and social history reviewed and updated as indicated. Interim medical history since our last visit reviewed. Allergies and medications reviewed and updated.  Review of Systems  Constitutional: Negative for fever or weight change.  Respiratory: Negative for cough and shortness of breath.   Cardiovascular: Negative for chest pain or palpitations.  Gastrointestinal: Negative for abdominal pain, no bowel changes.  Musculoskeletal: Negative for gait problem or joint swelling.  Skin: Negative for rash.  Neurological: Negative for dizziness or headache.  No other specific complaints in a complete review of systems  (except as listed in HPI above).      Objective:    BP 126/82   Pulse 83   Temp 98.4 F (36.9 C) (Oral)   Resp 16   Ht '5\' 1"'$  (1.549 m)   Wt 130 lb 1.6 oz (59 kg)   SpO2 97%   BMI 24.58 kg/m   Wt Readings from Last 3 Encounters:  02/02/22 130 lb 1.6 oz (59 kg)  12/05/21 134 lb (60.8 kg)  10/03/21 132 lb 14.4 oz (60.3 kg)    Physical Exam  Constitutional: Patient appears well-developed and well-nourished. No distress.  HEENT: head atraumatic, normocephalic, pupils equal and reactive to  light, neck supple, throat within normal limits Cardiovascular: Normal rate, regular rhythm and normal heart sounds.  No murmur heard. No BLE edema. Pulmonary/Chest: Effort normal and breath sounds normal. No respiratory distress. Abdominal: Soft.  There is no tenderness. Psychiatric: Patient has a normal mood and affect. behavior is normal. Judgment and thought content normal.  Results for orders placed or performed in visit on 12/05/21  CULTURE, URINE COMPREHENSIVE   Specimen: Urine   UR  Result Value Ref Range   Urine Culture, Comprehensive Final report (A)    Organism ID, Bacteria Comment (A)   Microscopic Examination   Urine  Result Value Ref Range   WBC, UA 0-5 0 - 5 /hpf   RBC, Urine 3-10 (A) 0 - 2 /hpf   Epithelial Cells (non renal) 0-10 0 - 10 /hpf   Bacteria, UA Few None seen/Few  Urinalysis, Complete  Result Value Ref Range   Specific Gravity, UA 1.015 1.005 - 1.030   pH, UA 7.0 5.0 - 7.5   Color, UA Yellow Yellow   Appearance Ur Clear Clear   Leukocytes,UA Trace (A) Negative   Protein,UA Negative Negative/Trace   Glucose, UA Negative Negative   Ketones, UA Negative Negative   RBC, UA Trace (A) Negative   Bilirubin, UA Negative Negative   Urobilinogen, Ur 2.0 (H) 0.2 - 1.0 mg/dL   Nitrite, UA Negative Negative   Microscopic Examination See below:       Assessment & Plan:   Problem List Items Addressed This Visit       Cardiovascular and Mediastinum    Hypertension - Primary (Chronic)    Continue taking valsartan 80 mg daily.       Relevant Orders   CBC with Differential/Platelet   COMPLETE METABOLIC PANEL WITH GFR     Respiratory   COPD (chronic obstructive pulmonary disease) (HCC) (Chronic)    Doing well on trelegy        Endocrine   Hypothyroidism (Chronic)    Currently taking levothyroxine 50 mcg daily 5 days a week and taking 75 mcg 2 days a week. Getting labs      Relevant Orders   TSH     Musculoskeletal and Integument   Osteoporosis    Continue fosamax, vitamin d and calcium supplement. Continue weight bearing exercises.         Other   Hyperlipidemia (Chronic)    Continue rosuvastatin 40 mg daily      Relevant Orders   COMPLETE METABOLIC PANEL WITH GFR   Lipid panel   Major depression, recurrent (HCC)    Continue taking celexa 30 mg daily      Other Visit Diagnoses     Type 2 diabetes, controlled, with neuropathy ([place])       A1C has been normal for years. not on medications   Relevant Orders   Hemoglobin A1c        Follow up plan: Return in about 6 months (around 08/03/2022) for follow up with Leisa.

## 2022-02-02 NOTE — Assessment & Plan Note (Signed)
Continue taking celexa 30 mg daily

## 2022-02-02 NOTE — Assessment & Plan Note (Signed)
Currently taking levothyroxine 50 mcg daily 5 days a week and taking 75 mcg 2 days a week. Getting labs

## 2022-02-02 NOTE — Assessment & Plan Note (Signed)
Doing well on trelegy

## 2022-02-02 NOTE — Assessment & Plan Note (Signed)
Continue rosuvastatin 40 mg daily

## 2022-02-02 NOTE — Assessment & Plan Note (Signed)
Continue taking valsartan 80 mg daily.

## 2022-02-03 LAB — CBC WITH DIFFERENTIAL/PLATELET
Absolute Monocytes: 429 cells/uL (ref 200–950)
Basophils Absolute: 20 cells/uL (ref 0–200)
Basophils Relative: 0.3 %
Eosinophils Absolute: 208 cells/uL (ref 15–500)
Eosinophils Relative: 3.1 %
HCT: 39.7 % (ref 35.0–45.0)
Hemoglobin: 13.2 g/dL (ref 11.7–15.5)
Lymphs Abs: 1889 cells/uL (ref 850–3900)
MCH: 29.6 pg (ref 27.0–33.0)
MCHC: 33.2 g/dL (ref 32.0–36.0)
MCV: 89 fL (ref 80.0–100.0)
MPV: 10.2 fL (ref 7.5–12.5)
Monocytes Relative: 6.4 %
Neutro Abs: 4154 cells/uL (ref 1500–7800)
Neutrophils Relative %: 62 %
Platelets: 263 10*3/uL (ref 140–400)
RBC: 4.46 10*6/uL (ref 3.80–5.10)
RDW: 12.4 % (ref 11.0–15.0)
Total Lymphocyte: 28.2 %
WBC: 6.7 10*3/uL (ref 3.8–10.8)

## 2022-02-03 LAB — LIPID PANEL
Cholesterol: 220 mg/dL — ABNORMAL HIGH (ref <200)
HDL: 47 mg/dL — ABNORMAL LOW (ref >=50)
LDL Cholesterol (Calc): 157 mg/dL (calc) — ABNORMAL HIGH
Non-HDL Cholesterol (Calc): 173 mg/dL (calc) — ABNORMAL HIGH (ref <130)
Total CHOL/HDL Ratio: 4.7 (calc) (ref <5.0)
Triglycerides: 66 mg/dL (ref <150)

## 2022-02-03 LAB — COMPLETE METABOLIC PANEL WITH GFR
AG Ratio: 1.5 (calc) (ref 1.0–2.5)
ALT: 11 U/L (ref 6–29)
AST: 18 U/L (ref 10–35)
Albumin: 4.3 g/dL (ref 3.6–5.1)
Alkaline phosphatase (APISO): 47 U/L (ref 37–153)
BUN: 20 mg/dL (ref 7–25)
CO2: 25 mmol/L (ref 20–32)
Calcium: 9.4 mg/dL (ref 8.6–10.4)
Chloride: 106 mmol/L (ref 98–110)
Creat: 0.77 mg/dL (ref 0.50–1.05)
Globulin: 2.8 g/dL (calc) (ref 1.9–3.7)
Glucose, Bld: 78 mg/dL (ref 65–99)
Potassium: 4.7 mmol/L (ref 3.5–5.3)
Sodium: 142 mmol/L (ref 135–146)
Total Bilirubin: 0.7 mg/dL (ref 0.2–1.2)
Total Protein: 7.1 g/dL (ref 6.1–8.1)
eGFR: 87 mL/min/{1.73_m2} (ref >=60)

## 2022-02-03 LAB — HEMOGLOBIN A1C
Hgb A1c MFr Bld: 5.5 % of total Hgb (ref <5.7)
Mean Plasma Glucose: 111 mg/dL
eAG (mmol/L): 6.2 mmol/L

## 2022-02-03 LAB — TSH: TSH: 1.63 mIU/L (ref 0.40–4.50)

## 2022-03-06 ENCOUNTER — Ambulatory Visit: Payer: Medicare HMO | Admitting: Urology

## 2022-03-06 ENCOUNTER — Encounter: Payer: Self-pay | Admitting: Urology

## 2022-03-06 VITALS — BP 126/83 | HR 80 | Ht 61.0 in | Wt 128.0 lb

## 2022-03-06 DIAGNOSIS — N3946 Mixed incontinence: Secondary | ICD-10-CM | POA: Diagnosis not present

## 2022-03-06 LAB — URINALYSIS, COMPLETE
Bilirubin, UA: NEGATIVE
Glucose, UA: NEGATIVE
Ketones, UA: NEGATIVE
Leukocytes,UA: NEGATIVE
Nitrite, UA: NEGATIVE
Protein,UA: NEGATIVE
Specific Gravity, UA: 1.01 (ref 1.005–1.030)
Urobilinogen, Ur: 0.2 mg/dL (ref 0.2–1.0)
pH, UA: 6 (ref 5.0–7.5)

## 2022-03-06 LAB — MICROSCOPIC EXAMINATION

## 2022-03-06 MED ORDER — OXYBUTYNIN CHLORIDE ER 10 MG PO TB24: 10.0000 mg | ORAL_TABLET | Freq: Every day | ORAL | 3 refills | Status: DC

## 2022-03-06 NOTE — Progress Notes (Signed)
03/06/2022 1:27 PM   [name] Sep 06, 1960 [id number]  Referring provider: Delsa Grana, PA-C [address] [place],  [place] [address]  No chief complaint on file.   HPI: I was consulted to assess the patient's urinary continence. She leaks with coughing sneezing or walking is the primary symptom. Sometimes she has urge incontinence. She has no bedwetting. She wears 2 pads a day that are damp  She voids every 2 hours gets up twice at night. Flow varies  She started oxybutynin 2 weeks ago and she is going more frequently but still leaks with nocturia  Has had a hysterectomy. Back injections lower back      Grade 1 hypermobility the bladder neck no rest incontinence or prolapse    Patient has milder mixed incontinence.  I will check again for microscopic hematuria especially if the culture is negative.    I have not seen the patient since May 2023. On urodynamics she initially voided 65 mL with a maximal flow of 8 mils per second.  It was a depressed flow pattern.  Her residual was a few milliliters.  Maximum bladder capacity was 224 mL.  She had increased bladder sensation.  She had an unstable bladder reaching pressure 13 cm of water.  There may have been some triggering with the coughing.  She did not feel urgency and she did not leak.  She had no stress incontinence with a Valsalva pressure of 114 cm of water.  During voiding she did generate a voluntary contraction.  She voided 213 mL.  Maximum voiding pressure 13 to 18 cm of water.  Flow rate was 14 mils per second.  Residual was 10 mL.  It was a mildly prolonged pattern.  EMG activity within normal limits.  No contrast given due to allergy.  Patient has mild mixed incontinence.  She has a high leak point pressure which would not necessarily explain why she leaks with walking.  I will try to help her with medical therapy and I recommended physical therapy today.  She thought she was infected today so I sent her urine  for culture.  I will have her come back on Myrbetriq 50 mg samples and prescription and do cystoscopy on that day.  I will do another cough test.  We will proceed accordingly perhaps urethral bulking agent might be a good option for her stress component   She think she has a UTI with burning and itching.  Culture sent and I sent in ciprofloxacin 250 mg twice a day for 7 days.  Still had blood in the urine today   She is currently on Myrbetriq as a partial responder.  It worked better than the oxybutynin.  She still said going frequently is bothering her and she still leaking some.  I will see her back for pelvic examination cystoscopy in 6 weeks on Gemtesa samples and prescription.  She understands that she still has microscopic hematuria I will order a CT scan.  She might consider physical therapy in the future    Today Frequency stable.  Last culture was positive for Streptococcus Patient is much better on the test about $110.  Clinically not infected but I sent the urine for culture.  She is getting up much less often at night.  She no longer leaks while she is walking  Cystoscopy: Patient underwent flexible cystoscopy.  Bladder mucosa and trigone were normal.  No cystitis.  No carcinoma.  She tolerated the procedure well  PMH: Past Medical History:  Diagnosis Date   Angina at rest    Arthritis    Asthma    Back pain with right-sided sciatica 09/30/2014   Bipolar disorder ([place])    Congenital scoliosis    COPD (chronic obstructive pulmonary disease) (HCC)    DDD (degenerative disc disease), lumbar    Depression    Elevated C-reactive protein (CRP) 11/10/2015   GERD (gastroesophageal reflux disease)    Gout    History of diabetes mellitus    previously on medication, removed once pt lost weight, A1c WNL > 2 yrs   History of kidney stones    History of scoliosis    was casted as a child   Hyperlipidemia    Hypertension    Lumbar radiculopathy 09/30/2014   Migraines     Panic attack    RA (rheumatoid arthritis) ([place])    Type 2 diabetes, controlled, 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    Surgical History: Past Surgical History:  Procedure Laterality Date   ABDOMINAL HYSTERECTOMY  2003   one ovary remains: due to heavy bleeding/endometrioma/cysts   BACK SURGERY     and injections   CARPAL TUNNEL RELEASE     COLONOSCOPY WITH PROPOFOL N/A 12/09/2019   Procedure: COLONOSCOPY WITH PROPOFOL;  Surgeon: [name], MD;  Location: [facility] ENDOSCOPY;  Service: Endoscopy;  Laterality: N/A;   FOOT SURGERY Right    bone spur   POLYPECTOMY     uterine    Home Medications:  Allergies as of 03/06/2022       Reactions   Contrast Media [iodinated Contrast Media] Shortness Of Breath   Aspirin Nausea And Vomiting   Macrobid [nitrofurantoin Monohyd Macro] Hives, Nausea Only        Medication List        Accurate as of March 06, 2022  1:27 PM. If you have any questions, ask your nurse or doctor.          albuterol 108 (90 Base) MCG/ACT inhaler Commonly known as: Ventolin HFA Inhale 2 puffs into the lungs every 4 (four) hours as needed for wheezing or shortness of breath.   alendronate 70 MG tablet Commonly known as: FOSAMAX TAKE 1 TABLET BY MOUTH EVERY 7 (SEVEN) DAYS. TAKE WITH A FULL GLASS OF WATER ON AN EMPTY STOMACH.   ALPRAZolam 1 MG tablet Commonly known as: XANAX Take 1 tablet by mouth 2 (two) times daily.   Calcium Carb-Cholecalciferol 600-10 MG-MCG Tabs Take by mouth.   citalopram 10 MG tablet Commonly known as: CELEXA Take 30 mg by mouth daily.   cyanocobalamin 1000 MCG tablet Commonly known as: VITAMIN B12 Take 1,000 mcg by mouth daily.   Gemtesa 75 MG Tabs Generic drug: Vibegron Take 75 mg by mouth daily.   ipratropium-albuterol 0.5-2.5 (3) MG/3ML Soln Commonly known as: DUONEB Take 3 mLs by nebulization every 6 (six) hours as needed (for coughing fits, wheeze, SOB).   levocetirizine 5 MG tablet Commonly known as:  XYZAL TAKE 1 TABLET BY MOUTH EVERY DAY IN THE EVENING   levothyroxine 50 MCG tablet Commonly known as: SYNTHROID TAKE 1 TABLET (50 MCG) BEFORE BREAKFAST X 5 DAYS A WEEK AND TAKE 1.5 TABLETS (75 MCG) BEFORE BREAKFAST 2X A WEEK   nystatin powder Commonly known as: nystatin Apply topically to rash two to three times a day   rosuvastatin 40 MG tablet Commonly known as: CRESTOR Take 1 tablet (40 mg total) by mouth daily. For cholesterol   Trelegy Ellipta 200-62.5-25 MCG/ACT Aepb  Generic drug: Fluticasone-Umeclidin-Vilant Inhale 1 puff into the lungs daily. Consistent time of day for COPD   triamcinolone ointment 0.1 % Commonly known as: KENALOG Apply 1 application topically 2 (two) times daily as needed.   valsartan 80 MG tablet Commonly known as: DIOVAN Take 1 tablet (80 mg total) by mouth daily. For blood pressure        Allergies:  Allergies  Allergen Reactions   Contrast Media [Iodinated Contrast Media] Shortness Of Breath   Aspirin Nausea And Vomiting   Macrobid [Nitrofurantoin Monohyd Macro] Hives and Nausea Only    Family History: Family History  Problem Relation Age of Onset   Alzheimer's disease Mother    Dementia Mother    Cancer Mother        colon   Heart disease Mother    Hypertension Mother    Depression Mother    Alzheimer's disease Father    Dementia Father    Hypertension Father    Cancer Brother        lung   Lung disease Brother    Hypertension Sister    Pneumonia Brother    Heart failure Brother    Breast cancer Neg Hx     Social History:  reports that she quit smoking about 31 years ago. Her smoking use included cigarettes. She has a 10.00 pack-year smoking history. She has never used smokeless tobacco. She reports that she does not drink alcohol and does not use drugs.  ROS:                                        Physical Exam: There were no vitals taken for this visit.  Constitutional:  Alert and oriented,  No acute distress. HEENT: [place] AT, moist mucus membranes.  Trachea midline, no masses.  Laboratory Data: Lab Results  Component Value Date   WBC 6.7 02/02/2022   HGB 13.2 02/02/2022   HCT 39.7 02/02/2022   MCV 89.0 02/02/2022   PLT 263 02/02/2022    Lab Results  Component Value Date   CREATININE 0.77 02/02/2022    No results found for: "PSA"  No results found for: "TESTOSTERONE"  Lab Results  Component Value Date   HGBA1C 5.5 02/02/2022    Urinalysis    Component Value Date/Time   COLORURINE YELLOW 04/19/2021 1528   APPEARANCEUR Clear 12/05/2021 1059   LABSPEC 1.015 04/19/2021 1528   PHURINE 5.5 04/19/2021 1528   GLUCOSEU Negative 12/05/2021 1059   HGBUR NEGATIVE 04/19/2021 1528   BILIRUBINUR Negative 12/05/2021 1059   KETONESUR NEGATIVE 04/19/2021 1528   PROTEINUR Negative 12/05/2021 1059   PROTEINUR NEGATIVE 04/19/2021 1528   UROBILINOGEN 0.2 06/17/2019 1203   NITRITE Negative 12/05/2021 1059   NITRITE NEGATIVE 04/19/2021 1528   LEUKOCYTESUR Trace (A) 12/05/2021 1059   LEUKOCYTESUR NEGATIVE 04/19/2021 1528    Pertinent Imaging: Urine sent for culture  Assessment & Plan: Reassess in 6 weeks on oxybutynin ER 10 mg 30 x 11.  We can always do samples and go back to [place] if necessary  1. Mixed incontinence    No follow-ups on file.  Reece Packer, MD  [place] [address] [place], [place] [address] [phone]

## 2022-03-09 LAB — CULTURE, URINE COMPREHENSIVE

## 2022-03-26 ENCOUNTER — Other Ambulatory Visit: Payer: Self-pay | Admitting: Family Medicine

## 2022-04-06 ENCOUNTER — Ambulatory Visit: Payer: Medicare HMO

## 2022-04-06 VITALS — Ht 61.0 in | Wt 128.0 lb

## 2022-04-06 DIAGNOSIS — Z0000 Encounter for general adult medical examination without abnormal findings: Secondary | ICD-10-CM | POA: Diagnosis not present

## 2022-04-06 NOTE — Progress Notes (Signed)
I connected with  Hasna Taff Gallien on 04/06/22 by a audio enabled telemedicine application and verified that I am speaking with the correct person using two identifiers.  Patient Location: Home  Provider Location: Office/Clinic  I discussed the limitations of evaluation and management by telemedicine. The patient expressed understanding and agreed to proceed.  Subjective:   SAFFIRE PASKINS is a 62 y.o. female who presents for Medicare Annual (Subsequent) preventive examination.  Review of Systems    Cardiac Risk Factors include: dyslipidemia;hypertension     Objective:    Today's Vitals   04/06/22 0837  Weight: 128 lb (58.1 kg)  Height: 5\' 1"  (1.549 m)   Body mass index is 24.19 kg/m.     04/06/2022    8:55 AM 03/31/2021   10:22 AM 03/30/2020   10:54 AM 12/09/2019    8:46 AM 03/27/2019   10:54 AM 03/22/2018    2:11 PM 05/02/2016   11:57 AM  Advanced Directives  Does Patient Have a Medical Advance Directive? Yes No No No No No No  Would patient like information on creating a medical advance directive?  No - Patient declined No - Patient declined  No - Patient declined No - Patient declined     Current Medications (verified) Outpatient Encounter Medications as of 04/06/2022  Medication Sig   albuterol (VENTOLIN HFA) 108 (90 Base) MCG/ACT inhaler Inhale 2 puffs into the lungs every 4 (four) hours as needed for wheezing or shortness of breath.   alendronate (FOSAMAX) 70 MG tablet TAKE 1 TABLET BY MOUTH EVERY 7 (SEVEN) DAYS. TAKE WITH A FULL GLASS OF WATER ON AN EMPTY STOMACH.   ALPRAZolam (XANAX) 1 MG tablet Take 1 tablet by mouth 2 (two) times daily.   Calcium Carb-Cholecalciferol 600-10 MG-MCG TABS Take by mouth.   citalopram (CELEXA) 10 MG tablet Take 30 mg by mouth daily.   Fluticasone-Umeclidin-Vilant (TRELEGY ELLIPTA) 200-62.5-25 MCG/ACT AEPB Inhale 1 puff into the lungs daily. Consistent time of day for COPD   levocetirizine (XYZAL) 5 MG tablet TAKE 1 TABLET BY MOUTH EVERY DAY  IN THE EVENING   levothyroxine (SYNTHROID) 50 MCG tablet TAKE 1 TABLET (50 MCG) BEFORE BREAKFAST X 5 DAYS A WEEK AND TAKE 1.5 TABLETS (75 MCG) BEFORE BREAKFAST 2X A WEEK   nystatin powder Apply topically to rash two to three times a day   oxybutynin (DITROPAN-XL) 10 MG 24 hr tablet Take 1 tablet (10 mg total) by mouth daily.   rosuvastatin (CRESTOR) 40 MG tablet Take 1 tablet (40 mg total) by mouth daily. For cholesterol   triamcinolone ointment (KENALOG) 0.1 % Apply 1 application topically 2 (two) times daily as needed.   valsartan (DIOVAN) 80 MG tablet Take 1 tablet (80 mg total) by mouth daily. For blood pressure   vitamin B-12 (CYANOCOBALAMIN) 1000 MCG tablet Take 1,000 mcg by mouth daily.   No facility-administered encounter medications on file as of 04/06/2022.    Allergies (verified) Contrast media [iodinated contrast media], Aspirin, and Macrobid [nitrofurantoin monohyd macro]   History: Past Medical History:  Diagnosis Date   Angina at rest    Arthritis    Asthma    Back pain with right-sided sciatica 09/30/2014   Bipolar disorder ([place])    Congenital scoliosis    COPD (chronic obstructive pulmonary disease) (HCC)    DDD (degenerative disc disease), lumbar    Depression    Elevated C-reactive protein (CRP) 11/10/2015   GERD (gastroesophageal reflux disease)    Gout    History  of diabetes mellitus    previously on medication, removed once pt lost weight, A1c WNL > 2 yrs   History of kidney stones    History of scoliosis    was casted as a child   Hyperlipidemia    Hypertension    Lumbar radiculopathy 09/30/2014   Migraines    Panic attack    RA (rheumatoid arthritis) ([place])    Type 2 diabetes, controlled, with neuropathy ([place]) 09/30/2014   Past Surgical History:  Procedure Laterality Date   ABDOMINAL HYSTERECTOMY  2003   one ovary remains: due to heavy bleeding/endometrioma/cysts   BACK SURGERY     and injections   CARPAL TUNNEL RELEASE     COLONOSCOPY WITH  PROPOFOL N/A 12/09/2019   Procedure: COLONOSCOPY WITH PROPOFOL;  Surgeon: [name], MD;  Location: [facility] ENDOSCOPY;  Service: Endoscopy;  Laterality: N/A;   FOOT SURGERY Right    bone spur   POLYPECTOMY     uterine   Family History  Problem Relation Age of Onset   Alzheimer's disease Mother    Dementia Mother    Cancer Mother        colon   Heart disease Mother    Hypertension Mother    Depression Mother    Alzheimer's disease Father    Dementia Father    Hypertension Father    Cancer Brother        lung   Lung disease Brother    Hypertension Sister    Pneumonia Brother    Heart failure Brother    Breast cancer Neg Hx    Social History   Socioeconomic History   Marital status: Widowed    Spouse name: Not on file   Number of children: 0   Years of education: Not on file   Highest education level: Associate degree: academic program  Occupational History   Not on file  Tobacco Use   Smoking status: Former    Packs/day: 2.00    Years: 5.00    Additional pack years: 0.00    Total pack years: 10.00    Types: Cigarettes    Quit date: 02/02/1991    Years since quitting: 31.1    Passive exposure: Past   Smokeless tobacco: Never  Vaping Use   Vaping Use: Never used  Substance and Sexual Activity   Alcohol use: No    Alcohol/week: 0.0 standard drinks of alcohol   Drug use: No   Sexual activity: Not Currently  Other Topics Concern   Not on file  Social History Narrative   Lives alone.      [employer] [place] Strain: Low Risk  (04/06/2022)   Overall Financial Resource Strain (CARDIA)    Difficulty of Paying Living Expenses: Not hard at all  Food Insecurity: No Food Insecurity (04/06/2022)   Hunger Vital Sign    Worried About Running Out of Food in the Last Year: Never true    Ran Out of Food in the Last Year: Never true  Transportation Needs: No  Transportation Needs (04/06/2022)   PRAPARE - Hydrologist (Medical): No    Lack of Transportation (Non-Medical): No  Physical Activity: Insufficiently Active (04/06/2022)   Exercise Vital Sign    Days of Exercise per Week: 2 days    Minutes of Exercise per Session: 40 min  Stress: No Stress Concern Present (04/06/2022)  [employer] Questionnaire    Feeling of Stress : Only a little  Social Connections: Moderately Integrated (04/06/2022)   Social Connection and Isolation Panel [NHANES]    Frequency of Communication with Friends and Family: More than three times a week    Frequency of Social Gatherings with Friends and Family: More than three times a week    Attends Religious Services: More than 4 times per year    Active Member of [employer] or Organizations: Yes    Attends Archivist Meetings: More than 4 times per year    Marital Status: Widowed    Tobacco Counseling Counseling given: Not Answered   Clinical Intake:  Pre-visit preparation completed: Yes  Pain : No/denies pain     BMI - recorded: 24.19 Nutritional Status: BMI of 19-24  Normal Nutritional Risks: None Diabetes: No  How often do you need to have someone help you when you read instructions, pamphlets, or other written materials from your doctor or pharmacy?: 1 - Never  Diabetic?NO  Interpreter Needed?: No  Information entered by :: B.Braxxton Stoudt,LPN   Activities of Daily Living    04/06/2022    8:47 AM 02/02/2022    1:19 PM  In your present state of health, do you have any difficulty performing the following activities:  Hearing? 0 0  Vision? 1 0  Difficulty concentrating or making decisions? 0 0  Walking or climbing stairs? 0 1  Dressing or bathing? 0 0  Doing errands, shopping? 0 0  Preparing Food and eating ? N   Using the Toilet? N   In the past six months, have you accidently leaked urine? Y   Do you have  problems with loss of bowel control? N   Managing your Medications? N   Managing your Finances? N   Housekeeping or managing your Housekeeping? N     Patient Care Team: Delsa Grana, PA-C as PCP - General (Family Medicine) Myer Haff, MD as Referring Physician (Psychiatry)  Indicate any recent Medical Services you may have received from other than [facility] providers in the past year (date may be approximate).     Assessment:   This is a routine wellness examination for [place].  Hearing/Vision screen Hearing Screening - Comments:: Adequate vision Vision Screening - Comments:: Cataracts causing blurry vision and dry eye syndrome Dr Gloriann Loan  Dietary issues and exercise activities discussed: Exercise limited by: None identified   Goals Addressed   None    Depression Screen    04/06/2022    8:44 AM 02/02/2022    1:30 PM 10/03/2021    1:31 PM 04/19/2021    2:39 PM 03/31/2021   10:19 AM 01/31/2021   10:14 AM 07/29/2020   11:05 AM  PHQ 2/9 Scores  PHQ - 2 Score 2 2 2 2 2  0 0  PHQ- 9 Score 4 5 4 4 4  0 2    Fall Risk    04/06/2022    8:40 AM 02/02/2022    1:19 PM 10/03/2021    1:30 PM 04/19/2021    2:39 PM 03/31/2021   10:24 AM  Fall Risk   Falls in the past year? 1 1 0 0 0  Number falls in past yr: 0 1 0 0 0  Injury with Fall? 0 0 0 0 0  Risk for fall due to : No Fall Risks  No Fall Risks No Fall Risks No Fall Risks  Follow up Education provided;Falls prevention discussed Falls evaluation  completed Falls prevention discussed;Education provided Falls prevention discussed Falls prevention discussed    FALL RISK PREVENTION PERTAINING TO THE HOME:  Any stairs in or around the home? No  If so, are there any without handrails? No  Home free of loose throw rugs in walkways, pet beds, electrical cords, etc? Yes  Adequate lighting in your home to reduce risk of falls? Yes   ASSISTIVE DEVICES UTILIZED TO PREVENT FALLS:  Life alert? No  Use of a cane, walker or w/c? No  Grab bars in the  bathroom? Yes  Shower chair or bench in shower? Yes  Elevated toilet seat or a handicapped toilet? No   Cognitive Function:        04/06/2022    8:56 AM 03/27/2019   10:57 AM 03/22/2018    2:15 PM  6CIT Screen  What Year? 0 points 0 points 0 points  What month? 0 points 0 points 0 points  What time? 0 points 0 points 0 points  Count back from 20 0 points 0 points 0 points  Months in reverse 0 points 0 points 0 points  Repeat phrase 0 points 0 points 0 points  Total Score 0 points 0 points 0 points    Immunizations Immunization History  Administered Date(s) Administered   Influenza,inj,Quad PF,6+ Mos 09/30/2015, 10/01/2017, 10/27/2018, 11/25/2019, 10/03/2021   Influenza-Unspecified 09/17/2014, 10/11/2016, 10/24/2018, 11/02/2020   Moderna Sars-Covid-2 Vaccination 04/18/2019, 05/16/2019, 12/09/2019   PPD Test 01/18/2021, 02/08/2021   Pneumococcal Conjugate-13 05/07/2013   Pneumococcal Polysaccharide-23 09/13/2011   Tdap 11/10/2009    TDAP status: Up to date  Flu Vaccine status: Up to date  Pneumococcal vaccine status: Up to date  Covid-19 vaccine status: Completed vaccines  Qualifies for Shingles Vaccine? Yes   Zostavax completed No   Shingrix Completed?: No.    Education has been provided regarding the importance of this vaccine. Patient has been advised to call insurance company to determine out of pocket expense if they have not yet received this vaccine. Advised may also receive vaccine at local pharmacy or Health Dept. Verbalized acceptance and understanding.  Screening Tests Health Maintenance  Topic Date Due   Zoster Vaccines- Shingrix (1 of 2) Never done   DTaP/Tdap/Td (2 - Td or Tdap) 11/11/2019   COVID-19 Vaccine (4 - 2023-24 season) 09/16/2021   Diabetic kidney evaluation - Urine ACR  04/20/2022   DEXA SCAN  05/25/2022   MAMMOGRAM  11/24/2022   Diabetic kidney evaluation - eGFR measurement  02/03/2023   Medicare Annual Wellness (AWV)  04/06/2023    COLONOSCOPY (Pts 45-22yrs Insurance coverage will need to be confirmed)  12/08/2024   INFLUENZA VACCINE  Completed   Hepatitis C Screening  Completed   HIV Screening  Completed   HPV VACCINES  Aged Out   PAP SMEAR-Modifier  Discontinued    Health Maintenance  Health Maintenance Due  Topic Date Due   Zoster Vaccines- Shingrix (1 of 2) Never done   DTaP/Tdap/Td (2 - Td or Tdap) 11/11/2019   COVID-19 Vaccine (4 - 2023-24 season) 09/16/2021   Diabetic kidney evaluation - Urine ACR  04/20/2022    Colorectal cancer screening: Type of screening: Colonoscopy. Completed yes. Repeat every 5 years  Mammogram status: Completed yes. Repeat every year  Bone Density status: Completed yes. Results reflect: Bone density results: OSTEOPOROSIS. Repeat every 3 years.  Lung Cancer Screening: (Low Dose CT Chest recommended if Age 73-80 years, 30 pack-year currently smoking OR have quit w/in 15years.) does not qualify.   Lung  Cancer Screening Referral: no  Additional Screening:  Hepatitis C Screening: does not qualify; Completed yes  Vision Screening: Recommended annual ophthalmology exams for early detection of glaucoma and other disorders of the eye. Is the patient up to date with their annual eye exam?  Yes  Who is the provider or what is the name of the office in which the patient attends annual eye exams? Dr Gloriann Loan If pt is not established with a provider, would they like to be referred to a provider to establish care? No .   Dental Screening: Recommended annual dental exams for proper oral hygiene  Community Resource Referral / Chronic Care Management: CRR required this visit?  No   CCM required this visit?  No      Plan:     I have personally reviewed and noted the following in the patient's chart:   Medical and social history Use of alcohol, tobacco or illicit drugs  Current medications and supplements including opioid prescriptions. Patient is not currently taking opioid  prescriptions. Functional ability and status Nutritional status Physical activity Advanced directives List of other physicians Hospitalizations, surgeries, and ER visits in previous 12 months Vitals Screenings to include cognitive, depression, and falls Referrals and appointments  In addition, I have reviewed and discussed with patient certain preventive protocols, quality metrics, and best practice recommendations. A written personalized care plan for preventive services as well as general preventive health recommendations were provided to patient.     Roger Shelter, LPN   [id number]   Nurse Notes: pt states she is doing alright, she had in person appt w/me this am but car would not start. Pt has no concerns or questions during this televisit.

## 2022-04-06 NOTE — Patient Instructions (Signed)
Deborah Blackwell , Thank you for taking time to come for your Medicare Wellness Visit. I appreciate your ongoing commitment to your health goals. Please review the following plan we discussed and let me know if I can assist you in the future.   These are the goals we discussed:  Goals      Weight (lb) < 200 lb (90.7 kg)     Patient would like to get down to about 125 lbs.        This is a list of the screening recommended for you and due dates:  Health Maintenance  Topic Date Due   Zoster (Shingles) Vaccine (1 of 2) Never done   DTaP/Tdap/Td vaccine (2 - Td or Tdap) 11/11/2019   COVID-19 Vaccine (4 - 2023-24 season) 09/16/2021   Yearly kidney health urinalysis for diabetes  04/20/2022   DEXA scan (bone density measurement)  05/25/2022   Mammogram  11/24/2022   Yearly kidney function blood test for diabetes  02/03/2023   Medicare Annual Wellness Visit  04/06/2023   Colon Cancer Screening  12/08/2024   Flu Shot  Completed   Hepatitis C Screening: USPSTF Recommendation to screen - Ages 18-79 yo.  Completed   HIV Screening  Completed   HPV Vaccine  Aged Out   Pap Smear  Discontinued    Advanced directives: yes  Conditions/risks identified: none  Next appointment: Follow up in one year for your annual wellness visit. 04/06/2023 @ 8:45am telephone  Preventive Care 40-64 Years, Female Preventive care refers to lifestyle choices and visits with your health care provider that can promote health and wellness. What does preventive care include? A yearly physical exam. This is also called an annual well check. Dental exams once or twice a year. Routine eye exams. Ask your health care provider how often you should have your eyes checked. Personal lifestyle choices, including: Daily care of your teeth and gums. Regular physical activity. Eating a healthy diet. Avoiding tobacco and drug use. Limiting alcohol use. Practicing safe sex. Taking low-dose aspirin daily starting at age  63. Taking vitamin and mineral supplements as recommended by your health care provider. What happens during an annual well check? The services and screenings done by your health care provider during your annual well check will depend on your age, overall health, lifestyle risk factors, and family history of disease. Counseling  Your health care provider may ask you questions about your: Alcohol use. Tobacco use. Drug use. Emotional well-being. Home and relationship well-being. Sexual activity. Eating habits. Work and work Statistician. Method of birth control. Menstrual cycle. Pregnancy history. Screening  You may have the following tests or measurements: Height, weight, and BMI. Blood pressure. Lipid and cholesterol levels. These may be checked every 5 years, or more frequently if you are over 84 years old. Skin check. Lung cancer screening. You may have this screening every year starting at age 79 if you have a 30-pack-year history of smoking and currently smoke or have quit within the past 15 years. Fecal occult blood test (FOBT) of the stool. You may have this test every year starting at age 98. Flexible sigmoidoscopy or colonoscopy. You may have a sigmoidoscopy every 5 years or a colonoscopy every 10 years starting at age 13. Hepatitis C blood test. Hepatitis B blood test. Sexually transmitted disease (STD) testing. Diabetes screening. This is done by checking your blood sugar (glucose) after you have not eaten for a while (fasting). You may have this done every 1-3 years. Mammogram. This  may be done every 1-2 years. Talk to your health care provider about when you should start having regular mammograms. This may depend on whether you have a family history of breast cancer. BRCA-related cancer screening. This may be done if you have a family history of breast, ovarian, tubal, or peritoneal cancers. Pelvic exam and Pap test. This may be done every 3 years starting at age 52.  Starting at age 70, this may be done every 5 years if you have a Pap test in combination with an HPV test. Bone density scan. This is done to screen for osteoporosis. You may have this scan if you are at high risk for osteoporosis. Discuss your test results, treatment options, and if necessary, the need for more tests with your health care provider. Vaccines  Your health care provider may recommend certain vaccines, such as: Influenza vaccine. This is recommended every year. Tetanus, diphtheria, and acellular pertussis (Tdap, Td) vaccine. You may need a Td booster every 10 years. Zoster vaccine. You may need this after age 17. Pneumococcal 13-valent conjugate (PCV13) vaccine. You may need this if you have certain conditions and were not previously vaccinated. Pneumococcal polysaccharide (PPSV23) vaccine. You may need one or two doses if you smoke cigarettes or if you have certain conditions. Talk to your health care provider about which screenings and vaccines you need and how often you need them. This information is not intended to replace advice given to you by your health care provider. Make sure you discuss any questions you have with your health care provider. Document Released: 01/29/2015 Document Revised: 09/22/2015 Document Reviewed: 11/03/2014 Elsevier Interactive Patient Education  2017 [place] Prevention in the Home Falls can cause injuries. They can happen to people of all ages. There are many things you can do to make your home safe and to help prevent falls. What can I do on the outside of my home? Regularly fix the edges of walkways and driveways and fix any cracks. Remove anything that might make you trip as you walk through a door, such as a raised step or threshold. Trim any bushes or trees on the path to your home. Use bright outdoor lighting. Clear any walking paths of anything that might make someone trip, such as rocks or tools. Regularly check to see if  handrails are loose or broken. Make sure that both sides of any steps have handrails. Any raised decks and porches should have guardrails on the edges. Have any leaves, snow, or ice cleared regularly. Use sand or salt on walking paths during winter. Clean up any spills in your garage right away. This includes oil or grease spills. What can I do in the bathroom? Use night lights. Install grab bars by the toilet and in the tub and shower. Do not use towel bars as grab bars. Use non-skid mats or decals in the tub or shower. If you need to sit down in the shower, use a plastic, non-slip stool. Keep the floor dry. Clean up any water that spills on the floor as soon as it happens. Remove soap buildup in the tub or shower regularly. Attach bath mats securely with double-sided non-slip rug tape. Do not have throw rugs and other things on the floor that can make you trip. What can I do in the bedroom? Use night lights. Make sure that you have a light by your bed that is easy to reach. Do not use any sheets or blankets that are too  big for your bed. They should not hang down onto the floor. Have a firm chair that has side arms. You can use this for support while you get dressed. Do not have throw rugs and other things on the floor that can make you trip. What can I do in the kitchen? Clean up any spills right away. Avoid walking on wet floors. Keep items that you use a lot in easy-to-reach places. If you need to reach something above you, use a strong step stool that has a grab bar. Keep electrical cords out of the way. Do not use floor polish or wax that makes floors slippery. If you must use wax, use non-skid floor wax. Do not have throw rugs and other things on the floor that can make you trip. What can I do with my stairs? Do not leave any items on the stairs. Make sure that there are handrails on both sides of the stairs and use them. Fix handrails that are broken or loose. Make sure that  handrails are as long as the stairways. Check any carpeting to make sure that it is firmly attached to the stairs. Fix any carpet that is loose or worn. Avoid having throw rugs at the top or bottom of the stairs. If you do have throw rugs, attach them to the floor with carpet tape. Make sure that you have a light switch at the top of the stairs and the bottom of the stairs. If you do not have them, ask someone to add them for you. What else can I do to help prevent falls? Wear shoes that: Do not have high heels. Have rubber bottoms. Are comfortable and fit you well. Are closed at the toe. Do not wear sandals. If you use a stepladder: Make sure that it is fully opened. Do not climb a closed stepladder. Make sure that both sides of the stepladder are locked into place. Ask someone to hold it for you, if possible. Clearly mark and make sure that you can see: Any grab bars or handrails. First and last steps. Where the edge of each step is. Use tools that help you move around (mobility aids) if they are needed. These include: Canes. Walkers. Scooters. Crutches. Turn on the lights when you go into a dark area. Replace any light bulbs as soon as they burn out. Set up your furniture so you have a clear path. Avoid moving your furniture around. If any of your floors are uneven, fix them. If there are any pets around you, be aware of where they are. Review your medicines with your doctor. Some medicines can make you feel dizzy. This can increase your chance of falling. Ask your doctor what other things that you can do to help prevent falls. This information is not intended to replace advice given to you by your health care provider. Make sure you discuss any questions you have with your health care provider. Document Released: 10/29/2008 Document Revised: 06/10/2015 Document Reviewed: 02/06/2014 Elsevier Interactive Patient Education  2017 [employer].

## 2022-04-27 ENCOUNTER — Other Ambulatory Visit: Payer: Self-pay | Admitting: Internal Medicine

## 2022-04-27 NOTE — Telephone Encounter (Signed)
Requested Prescriptions  Refused Prescriptions Disp Refills   levothyroxine (SYNTHROID) 50 MCG tablet [Pharmacy Med Name: LEVOTHYROXINE 50 MCG TABLET] 103 tablet 0    Sig: TAKE 1 TABLET BEFORE BREAKFAST X 5 DAYS A WEEK AND TAKE 1.5 TABLETS BEFORE BREAKFAST 2X A WEEK     Endocrinology:  Hypothyroid Agents Passed - 04/27/2022  1:38 PM      Passed - TSH in normal range and within 360 days    TSH  Date Value Ref Range Status  02/02/2022 1.63 0.40 - 4.50 mIU/L Final         Passed - Valid encounter within last 12 months    Recent Outpatient Visits           2 months ago Primary hypertension   [place] [facility] Berniece Salines, FNP   6 months ago Primary hypertension   [place] [facility] Danelle Berry, PA-C   1 year ago Urinary incontinence, unspecified type   [facility] Danelle Berry, PA-C   1 year ago Chronic obstructive pulmonary disease, unspecified COPD type [facility])   [place] [facility] Mecum, Oswaldo Conroy, PA-C   1 year ago Localized osteoporosis without current pathological fracture   [facility] Danelle Berry, PA-C       Future Appointments             In 4 days MacDiarmid, Lorin Picket, MD [facility]

## 2022-05-01 ENCOUNTER — Ambulatory Visit: Payer: Medicare HMO | Admitting: Urology

## 2022-05-01 DIAGNOSIS — F339 Major depressive disorder, recurrent, unspecified: Secondary | ICD-10-CM | POA: Diagnosis not present

## 2022-05-01 DIAGNOSIS — Z634 Disappearance and death of family member: Secondary | ICD-10-CM | POA: Diagnosis not present

## 2022-05-25 ENCOUNTER — Other Ambulatory Visit: Payer: Self-pay | Admitting: Family Medicine

## 2022-05-25 DIAGNOSIS — E113393 Type 2 diabetes mellitus with moderate nonproliferative diabetic retinopathy without macular edema, bilateral: Secondary | ICD-10-CM | POA: Diagnosis not present

## 2022-05-25 DIAGNOSIS — H524 Presbyopia: Secondary | ICD-10-CM | POA: Diagnosis not present

## 2022-05-25 DIAGNOSIS — J329 Chronic sinusitis, unspecified: Secondary | ICD-10-CM

## 2022-05-25 NOTE — Telephone Encounter (Signed)
Requested Prescriptions  Pending Prescriptions Disp Refills   levocetirizine (XYZAL) 5 MG tablet [Pharmacy Med Name: LEVOCETIRIZINE 5 MG TABLET] 90 tablet 0    Sig: TAKE 1 TABLET BY MOUTH EVERY DAY IN THE EVENING     Ear, Nose, and Throat:  Antihistamines - levocetirizine dihydrochloride Passed - 05/25/2022  2:28 AM      Passed - Cr in normal range and within 360 days    Creat  Date Value Ref Range Status  02/02/2022 0.77 0.50 - 1.05 mg/dL Final   Creatinine, Urine  Date Value Ref Range Status  04/19/2021 50 20 - 275 mg/dL Final         Passed - eGFR is 10 or above and within 360 days    GFR, Est African American  Date Value Ref Range Status  06/17/2019 85 > OR = 60 mL/min/1.73m2 Final   GFR, Est Non African American  Date Value Ref Range Status  06/17/2019 73 > OR = 60 mL/min/1.62m2 Final   eGFR  Date Value Ref Range Status  02/02/2022 87 > OR = 60 mL/min/1.22m2 Final         Passed - Valid encounter within last 12 months    Recent Outpatient Visits           3 months ago Primary hypertension   [place] [facility] [facility] Berniece Salines, FNP   7 months ago Primary hypertension   [place] [facility] Danelle Berry, PA-C   1 year ago Urinary incontinence, unspecified type   [facility] Danelle Berry, PA-C   1 year ago Chronic obstructive pulmonary disease, unspecified COPD type [facility])    [facility] Mecum, Oswaldo Conroy, PA-C   1 year ago Localized osteoporosis without current pathological fracture   [facility] Danelle Berry, [place]

## 2022-08-03 ENCOUNTER — Ambulatory Visit: Payer: Medicare HMO | Admitting: Family Medicine

## 2022-08-21 ENCOUNTER — Other Ambulatory Visit: Payer: Self-pay | Admitting: Family Medicine

## 2022-08-21 DIAGNOSIS — J329 Chronic sinusitis, unspecified: Secondary | ICD-10-CM

## 2022-09-12 DIAGNOSIS — Z634 Disappearance and death of family member: Secondary | ICD-10-CM | POA: Diagnosis not present

## 2022-09-12 DIAGNOSIS — F339 Major depressive disorder, recurrent, unspecified: Secondary | ICD-10-CM | POA: Diagnosis not present

## 2022-09-12 DIAGNOSIS — F419 Anxiety disorder, unspecified: Secondary | ICD-10-CM | POA: Diagnosis not present

## 2022-10-04 ENCOUNTER — Ambulatory Visit: Payer: Medicare HMO | Admitting: Family Medicine

## 2022-10-05 ENCOUNTER — Other Ambulatory Visit: Payer: Self-pay | Admitting: Family Medicine

## 2022-10-05 DIAGNOSIS — I10 Essential (primary) hypertension: Secondary | ICD-10-CM

## 2022-10-11 ENCOUNTER — Ambulatory Visit: Payer: Medicare HMO | Admitting: Family Medicine

## 2022-10-16 ENCOUNTER — Ambulatory Visit (INDEPENDENT_AMBULATORY_CARE_PROVIDER_SITE_OTHER): Payer: Medicare HMO | Admitting: Physician Assistant

## 2022-10-16 ENCOUNTER — Encounter: Payer: Self-pay | Admitting: Physician Assistant

## 2022-10-16 VITALS — BP 122/78 | HR 86 | Temp 98.1°F | Resp 12 | Ht 61.0 in | Wt 128.2 lb

## 2022-10-16 DIAGNOSIS — I10 Essential (primary) hypertension: Secondary | ICD-10-CM

## 2022-10-16 DIAGNOSIS — Z1231 Encounter for screening mammogram for malignant neoplasm of breast: Secondary | ICD-10-CM

## 2022-10-16 DIAGNOSIS — E782 Mixed hyperlipidemia: Secondary | ICD-10-CM | POA: Diagnosis not present

## 2022-10-16 DIAGNOSIS — Z23 Encounter for immunization: Secondary | ICD-10-CM | POA: Diagnosis not present

## 2022-10-16 DIAGNOSIS — E039 Hypothyroidism, unspecified: Secondary | ICD-10-CM | POA: Diagnosis not present

## 2022-10-16 DIAGNOSIS — E1140 Type 2 diabetes mellitus with diabetic neuropathy, unspecified: Secondary | ICD-10-CM | POA: Diagnosis not present

## 2022-10-16 DIAGNOSIS — Z0000 Encounter for general adult medical examination without abnormal findings: Secondary | ICD-10-CM | POA: Diagnosis not present

## 2022-10-16 DIAGNOSIS — R7303 Prediabetes: Secondary | ICD-10-CM | POA: Insufficient documentation

## 2022-10-16 DIAGNOSIS — M816 Localized osteoporosis [Lequesne]: Secondary | ICD-10-CM

## 2022-10-16 DIAGNOSIS — J449 Chronic obstructive pulmonary disease, unspecified: Secondary | ICD-10-CM | POA: Diagnosis not present

## 2022-10-16 MED ORDER — ALENDRONATE SODIUM 70 MG PO TABS: ORAL_TABLET | ORAL | 3 refills | Status: DC

## 2022-10-16 MED ORDER — ALBUTEROL SULFATE HFA 108 (90 BASE) MCG/ACT IN AERS: 2.0000 | INHALATION_SPRAY | RESPIRATORY_TRACT | 0 refills | Status: DC | PRN

## 2022-10-16 MED ORDER — TRELEGY ELLIPTA 200-62.5-25 MCG/ACT IN AEPB: 1.0000 | INHALATION_SPRAY | Freq: Every day | RESPIRATORY_TRACT | 3 refills | Status: AC

## 2022-10-16 MED ORDER — LEVOTHYROXINE SODIUM 50 MCG PO TABS: 50.0000 ug | ORAL_TABLET | ORAL | 0 refills | Status: DC

## 2022-10-16 MED ORDER — ROSUVASTATIN CALCIUM 40 MG PO TABS
40.0000 mg | ORAL_TABLET | Freq: Every day | ORAL | 3 refills | Status: DC
Start: 2022-10-16 — End: 2023-10-08

## 2022-10-16 NOTE — Assessment & Plan Note (Addendum)
Chronic, historic condition Appears well managed with Vitamin D and Calcium supplement as well as Fosamax  Refills provided today- continue current regimen as well as exercise  Orders placed for Dexa scan as she is due this year Results to dictate further management

## 2022-10-16 NOTE — Assessment & Plan Note (Signed)
Reviewed A1c results going back to 2016- cannot find A1c over 6.4 Most recent A1c was 5.5  She is not taking medications at this time and glucose appears to be controlled with diet and exercise Continue these measures  Recheck A1c and microalbumin today Foot exam due at next apt Follow up in 6 months or sooner if concerns arise

## 2022-10-16 NOTE — Assessment & Plan Note (Signed)
Chronic, historic condition Appears well managed with current regimen comprised of Levothyroxine 50 mcg PO every day 5 days per week and 75 mcg PO daily for 2 days of the week Check TSH today for monitoring  Results to dictate further management  Continue current regimen for now Follow up in 6 months or sooner if concerns arise

## 2022-10-16 NOTE — Assessment & Plan Note (Signed)
Chronic, historic condition Appears well controlled on current regimen comprised of Valsartan 80 mg PO every day  Continue current regimen Continue checking BP at home for monitoring Follow up in 6 months or sooner if concerns arise

## 2022-10-16 NOTE — Assessment & Plan Note (Signed)
Chronic, historic condition Appears well managed on current regimen of Rosuvastatin 40 mg PO every day  Recheck lipid panel today- Results to dictate further management  Follow up in 6 months or sooner if concerns arise

## 2022-10-16 NOTE — Progress Notes (Deleted)
Annual Physical Exam   Name: Deborah Blackwell   MRN: [id number]    DOB: September 21, 1960   Date:10/16/2022  Today's Provider: Jacquelin Hawking, MHS, PA-C Introduced myself to the patient as a PA-C and provided education on APPs in clinical practice.         Subjective  Chief Complaint  Chief Complaint  Patient presents with   Hyperlipidemia   Hypothyroidism   Hypertension   Diabetes    HPI  Patient presents for annual CPE and chronic follow up      Flowsheet Row Clinical Support from 04/06/2022 in [facility]  AUDIT-C Score 0      Depression: Phq 9 is mildly positive    10/16/2022    2:13 PM 04/06/2022    8:44 AM 02/02/2022    1:30 PM 10/03/2021    1:31 PM 04/19/2021    2:39 PM  Depression screen PHQ 2/9  Decreased Interest 1 1 1 1 1   Down, Depressed, Hopeless 1 1 1 1 1   PHQ - 2 Score 2 2 2 2 2   Altered sleeping 1 1 1 1  0  Tired, decreased energy 1 1 1 1 1   Change in appetite 0 0 0 0 0  Feeling bad or failure about yourself  0 0 0 0 0  Trouble concentrating 1 0 0 0 1  Moving slowly or fidgety/restless 0 0 1 0 0  Suicidal thoughts 0 0 0 0 0  PHQ-9 Score 5 4 5 4 4   Difficult doing work/chores Somewhat difficult Not difficult at all Somewhat difficult Not difficult at all Somewhat difficult       10/16/2022    2:20 PM 02/02/2022    1:30 PM 07/29/2020   11:06 AM 04/13/2020   10:33 AM  GAD 7 : Generalized Anxiety Score  Nervous, Anxious, on Edge 1 1 0 1  Control/stop worrying 1 1 0 1  Worry too much - different things 0 0 1 1  Trouble relaxing 1 1 1 1   Restless 1 1 0 1  Easily annoyed or irritable 1 0 0 0  Afraid - awful might happen 0 0 0 1  Total GAD 7 Score 5 4 2 6   Anxiety Difficulty Somewhat difficult Somewhat difficult Not difficult at all Somewhat difficult      Lipids: Lab Results  Component Value Date   CHOL 220 (H) 02/02/2022   CHOL 188 04/19/2021   CHOL 177 07/29/2020   Lab Results  Component Value Date   HDL 47 (L)  02/02/2022   HDL 44 (L) 04/19/2021   HDL 42 (L) 07/29/2020   Lab Results  Component Value Date   LDLCALC 157 (H) 02/02/2022   LDLCALC 127 (H) 04/19/2021   LDLCALC 118 (H) 07/29/2020   Lab Results  Component Value Date   TRIG 66 02/02/2022   TRIG 78 04/19/2021   TRIG 73 07/29/2020   Lab Results  Component Value Date   CHOLHDL 4.7 02/02/2022   CHOLHDL 4.3 04/19/2021   CHOLHDL 4.2 07/29/2020   No results found for: "LDLDIRECT"  Glucose: Glucose, Bld  Date Value Ref Range Status  02/02/2022 78 65 - 99 mg/dL Final    Comment:    .            Fasting reference interval .   10/03/2021 81 65 - 99 mg/dL Final    Comment:    .  Fasting reference interval .   04/19/2021 76 65 - 99 mg/dL Final    Comment:    .            Fasting reference interval .    Glucose-Capillary  Date Value Ref Range Status  12/09/2019 86 70 - 99 mg/dL Final    Comment:    Glucose reference range applies only to samples taken after fasting for at least 8 hours.    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   Major depression, recurrent (HCC) 01/05/2017   Abnormal grief reaction 01/05/2017   Chronic pain syndrome 03/07/2016   Vitamin D deficiency 11/10/2015   Arthralgia 10/20/2015   Lumbar facet hypertrophy (Bilateral) 10/20/2015   Lumbar facet syndrome (Location of Primary Source of Pain) (Right) 10/20/2015   Lumbar spondylosis 10/20/2015   Chronic shoulder pain (Right) 10/18/2015   Chronic low back pain (Location of Primary Source of Pain) (Right) 10/18/2015   Chronic lower extremity pain (Location of Secondary source of pain) (Right) 10/18/2015   Occipital neuralgia (Right) 10/18/2015   Cervicogenic headache 10/18/2015   Chronic neck pain (Location of Tertiary source of pain)  (Right) 09/30/2014   Cervical disc disorder with radiculopathy of cervical region 09/30/2014   Gout 08/07/2014   Hyperlipidemia 08/07/2014   Hypothyroidism 08/07/2014   Congenital scoliosis 08/07/2014   COPD (chronic obstructive pulmonary disease) (HCC)    Hypertension     Past Surgical History:  Procedure Laterality Date   ABDOMINAL HYSTERECTOMY  2003   one ovary remains: due to heavy bleeding/endometrioma/cysts   BACK SURGERY     and injections   CARPAL TUNNEL RELEASE     COLONOSCOPY WITH PROPOFOL N/A 12/09/2019   Procedure: COLONOSCOPY WITH PROPOFOL;  Surgeon: Midge Minium, MD;  Location: [facility] ENDOSCOPY;  Service: Endoscopy;  Laterality: N/A;   FOOT SURGERY Right    bone spur   POLYPECTOMY     uterine    Family History  Problem Relation Age of Onset   Alzheimer's disease Mother    Dementia Mother    Cancer Mother        colon   Heart disease Mother    Hypertension Mother    Depression Mother    Alzheimer's disease Father    Dementia Father    Hypertension Father    Cancer Brother        lung   Lung disease Brother    Hypertension Sister    Pneumonia Brother    Heart failure Brother    Breast cancer Neg Hx     Social History   Socioeconomic History   Marital status: Widowed    Spouse name: Not on file   Number of children: 0   Years of education: Not on file   Highest education level: Associate degree: academic program  Occupational History   Not on file  Tobacco Use   Smoking status: Former    Current packs/day: 0.00    Average packs/day: 2.0 packs/day for 5.0 years (10.0 ttl pk-yrs)    Types: Cigarettes    Start date: 02/01/1986    Quit date: 02/02/1991    Years since quitting: 31.7    Passive exposure: Past   Smokeless tobacco: Never  Vaping Use   Vaping status: Never Used  Substance and Sexual Activity   Alcohol use:  No    Alcohol/week: 0.0 standard drinks of alcohol   Drug use: No   Sexual activity: Not Currently  Other Topics Concern    Not on file  Social History Narrative   Lives alone.      Volunteers and [employer], [employer] [facility]   Social Determinants of Health   Financial Resource Strain: Low Risk  (04/06/2022)   Overall Financial Resource Strain (CARDIA)    Difficulty of Paying Living Expenses: Not hard at all  Food Insecurity: No Food Insecurity (04/06/2022)   Hunger Vital Sign    Worried About Running Out of Food in the Last Year: Never true    Ran Out of Food in the Last Year: Never true  Transportation Needs: No Transportation Needs (04/06/2022)   PRAPARE - Administrator, Civil Service (Medical): No    Lack of Transportation (Non-Medical): No  Physical Activity: Insufficiently Active (04/06/2022)   Exercise Vital Sign    Days of Exercise per Week: 2 days    Minutes of Exercise per Session: 40 min  Stress: No Stress Concern Present (04/06/2022)   [employer] of Occupational Health - Occupational Stress Questionnaire    Feeling of Stress : Only a little  Social Connections: Moderately Integrated (04/06/2022)   Social Connection and Isolation Panel [NHANES]    Frequency of Communication with Friends and Family: More than three times a week    Frequency of Social Gatherings with Friends and Family: More than three times a week    Attends Religious Services: More than 4 times per year    Active Member of [employer] or Organizations: Yes    Attends Banker Meetings: More than 4 times per year    Marital Status: Widowed  Intimate Partner Violence: Not At Risk (04/06/2022)   Humiliation, Afraid, Rape, and Kick questionnaire    Fear of Current or Ex-Partner: No    Emotionally Abused: No    Physically Abused: No    Sexually Abused: No     Current Outpatient Medications:    ALPRAZolam (XANAX) 1 MG tablet, Take 1 tablet by mouth 2 (two) times daily., Disp: , Rfl: 2   Calcium Carb-Cholecalciferol 600-10 MG-MCG TABS, Take by mouth., Disp: , Rfl:     citalopram (CELEXA) 10 MG tablet, Take 30 mg by mouth daily., Disp: , Rfl:    levocetirizine (XYZAL) 5 MG tablet, TAKE 1 TABLET BY MOUTH EVERY DAY IN THE EVENING, Disp: 90 tablet, Rfl: 0   nystatin powder, Apply topically to rash two to three times a day, Disp: 30 g, Rfl: 1   oxybutynin (DITROPAN-XL) 10 MG 24 hr tablet, Take 1 tablet (10 mg total) by mouth daily., Disp: 90 tablet, Rfl: 3   traZODone (DESYREL) 100 MG tablet, Take 50-100 mg by mouth at bedtime., Disp: , Rfl:    triamcinolone ointment (KENALOG) 0.1 %, Apply 1 application topically 2 (two) times daily as needed., Disp: 30 g, Rfl: 2   valsartan (DIOVAN) 80 MG tablet, TAKE 1 TABLET (80 MG TOTAL) BY MOUTH DAILY. FOR BLOOD PRESSURE, Disp: 90 tablet, Rfl: 3   vitamin B-12 (CYANOCOBALAMIN) 1000 MCG tablet, Take 1,000 mcg by mouth daily., Disp: , Rfl:    albuterol (VENTOLIN HFA) 108 (90 Base) MCG/ACT inhaler, Inhale 2 puffs into the lungs every 4 (four) hours as needed for wheezing or shortness of breath., Disp: 1 each, Rfl: 0   alendronate (FOSAMAX) 70 MG tablet, TAKE 1 TABLET BY MOUTH EVERY  7 (SEVEN) DAYS. TAKE WITH A FULL GLASS OF WATER ON AN EMPTY STOMACH., Disp: 12 tablet, Rfl: 3   Fluticasone-Umeclidin-Vilant (TRELEGY ELLIPTA) 200-62.5-25 MCG/ACT AEPB, Inhale 1 puff into the lungs daily. Consistent time of day for COPD, Disp: 28 each, Rfl: 3   levothyroxine (SYNTHROID) 50 MCG tablet, Take 1 tablet (50 mcg total) by mouth as directed. TAKE 1 TABLET (50 MCG) BEFORE BREAKFAST X 5 DAYS A WEEK AND TAKE 1.5 TABLETS (75 MCG) BEFORE BREAKFAST 2X A WEEK, Disp: 103 tablet, Rfl: 0   rosuvastatin (CRESTOR) 40 MG tablet, Take 1 tablet (40 mg total) by mouth daily. For cholesterol, Disp: 90 tablet, Rfl: 3  Allergies  Allergen Reactions   Contrast Media [Iodinated Contrast Media] Shortness Of Breath   Aspirin Nausea And Vomiting   Macrobid [Nitrofurantoin Monohyd Macro] Hives and Nausea Only     ROS    Objective  Vitals:   10/16/22 1413   BP: 122/78  Pulse: 86  Resp: 12  Temp: 98.1 F (36.7 C)  TempSrc: Oral  SpO2: 96%  Weight: 128 lb 3.2 oz (58.2 kg)  Height: 5\' 1"  (1.549 m)    Body mass index is 24.22 kg/m.  Physical Exam   No results found for this or any previous visit (from the past 2160 hour(s)).   Fall Risk:    10/16/2022    2:12 PM 04/06/2022    8:40 AM 02/02/2022    1:19 PM 10/03/2021    1:30 PM 04/19/2021    2:39 PM  Fall Risk   Falls in the past year? 0 1 1 0 0  Number falls in past yr: 0 0 1 0 0  Injury with Fall? 0 0 0 0 0  Risk for fall due to : No Fall Risks No Fall Risks  No Fall Risks No Fall Risks  Follow up Falls evaluation completed;Education provided;Falls prevention discussed Education provided;Falls prevention discussed Falls evaluation completed Falls prevention discussed;Education provided Falls prevention discussed     Functional Status Survey: Is the patient deaf or have difficulty hearing?: No Does the patient have difficulty seeing, even when wearing glasses/contacts?: Yes Does the patient have difficulty concentrating, remembering, or making decisions?: Yes Does the patient have difficulty walking or climbing stairs?: Yes Does the patient have difficulty dressing or bathing?: No Does the patient have difficulty doing errands alone such as visiting a doctor's office or shopping?: No   Assessment & Plan  Problem List Items Addressed This Visit       Cardiovascular and Mediastinum   Hypertension (Chronic)   Relevant Medications   rosuvastatin (CRESTOR) 40 MG tablet   Other Relevant Orders   CBC w/Diff/Platelet   COMPLETE METABOLIC PANEL WITH GFR     Respiratory   COPD (chronic obstructive pulmonary disease) (HCC) (Chronic)   Relevant Medications   Fluticasone-Umeclidin-Vilant (TRELEGY ELLIPTA) 200-62.5-25 MCG/ACT AEPB   albuterol (VENTOLIN HFA) 108 (90 Base) MCG/ACT inhaler     Endocrine   Hypothyroidism (Chronic)   Relevant Medications   levothyroxine  (SYNTHROID) 50 MCG tablet   Other Relevant Orders   TSH     Musculoskeletal and Integument   Osteoporosis   Relevant Medications   alendronate (FOSAMAX) 70 MG tablet   Other Relevant Orders   DG Bone Density     Other   Hyperlipidemia (Chronic)   Relevant Medications   rosuvastatin (CRESTOR) 40 MG tablet   Other Relevant Orders   Lipid Profile   Other Visit Diagnoses     Annual physical  exam    -  Primary   Type 2 diabetes, controlled, with neuropathy (HCC)       Relevant Medications   rosuvastatin (CRESTOR) 40 MG tablet   Other Relevant Orders   Urine Microalbumin w/creat. ratio   HgB A1c   Need for immunization against influenza            Return in about 6 months (around 04/15/2023) for DM, HLD, HTN, Depression, anxiety, hypothyroidism .   I, Isabelle Matt E Jullian Previti, PA-C, have reviewed all documentation for this visit. The documentation on 10/16/22 for the exam, diagnosis, procedures, and orders are all accurate and complete.   Jacquelin Hawking, MHS, PA-C [facility]    -USPSTF grade A and B recommendations reviewed with patient; age-appropriate recommendations, preventive care, screening tests, etc discussed and encouraged; healthy living encouraged; see AVS for patient education given to patient -Discussed importance of 150 minutes of physical activity weekly, eat two servings of fish weekly, eat one serving of tree nuts ( cashews, pistachios, pecans, almonds.Marland Kitchen) every other day, eat 6 servings of fruit/vegetables daily and drink plenty of water and avoid sweet beverages.   -Reviewed Health Maintenance: Yes.

## 2022-10-16 NOTE — Progress Notes (Signed)
Established Patient Office Visit  Name: Deborah Blackwell   MRN: [id number]    DOB: April 02, 1960   Date:10/16/2022  Today's Provider: Jacquelin Hawking, MHS, PA-C Introduced myself to the patient as a PA-C and provided education on APPs in clinical practice.         Subjective  Chief Complaint  Chief Complaint  Patient presents with   Hyperlipidemia   Hypothyroidism   Hypertension   Diabetes    HPI   HYPERTENSION / HYPERLIPIDEMIA Satisfied with current treatment? yes Duration of hypertension: years BP monitoring frequency: a few times a week BP range: She reports usually getting 120s/70s  BP medication side effects: no Past BP meds: Valsartan 80 mg PO every day,  Duration of hyperlipidemia: chronic Cholesterol medication side effects: no Cholesterol supplements: none Past cholesterol medications: rosuvastatin (crestor) Medication compliance: good compliance Aspirin: no Recent stressors: yes Recurrent headaches: yes- feels like a tension HA. sometimes happening in PM, started about a month ago. She just got new glasses and has apt in Nov with eye doctor Visual changes: yes- has apt with eye doctor  Palpitations: no Dyspnea: no Chest pain: no Lower extremity edema: no Dizzy/lightheaded: yes- about a week ago, felt like she might faint    COPD  She reports her breathing has been a lot better  She reports she has lost a lot of weight which has helped significantly    Diabetes Not currently on medications Previous A1c was 5.5 She reports she has lost a lot of weight and managed to keep A1c down with lifestyle    HYPOTHYROIDISM Thyroid control status:stable and controlled  Satisfied with current treatment? yes Medication side effects: no Medication compliance: good compliance Etiology of hypothyroidism:  Recent dose adjustment:no Fatigue: yes Cold intolerance: no Heat intolerance: no Weight gain: no Weight loss: no Constipation: yes- a few months ago   Diarrhea/loose stools: no Palpitations: no Lower extremity edema: no Anxiety/depressed mood: yes   Depression/ Anxiety She reports this time of the year she gets down and worries a lot She is seeing a therapist and feels like her meds are at a good level  She reports Sept -Dec are her worst months     10/16/2022    2:13 PM 04/06/2022    8:44 AM 02/02/2022    1:30 PM 10/03/2021    1:31 PM 04/19/2021    2:39 PM  Depression screen PHQ 2/9  Decreased Interest 1 1 1 1 1   Down, Depressed, Hopeless 1 1 1 1 1   PHQ - 2 Score 2 2 2 2 2   Altered sleeping 1 1 1 1  0  Tired, decreased energy 1 1 1 1 1   Change in appetite 0 0 0 0 0  Feeling bad or failure about yourself  0 0 0 0 0  Trouble concentrating 1 0 0 0 1  Moving slowly or fidgety/restless 0 0 1 0 0  Suicidal thoughts 0 0 0 0 0  PHQ-9 Score 5 4 5 4 4   Difficult doing work/chores Somewhat difficult Not difficult at all Somewhat difficult Not difficult at all Somewhat difficult      10/16/2022    2:20 PM 02/02/2022    1:30 PM 07/29/2020   11:06 AM 04/13/2020   10:33 AM  GAD 7 : Generalized Anxiety Score  Nervous, Anxious, on Edge 1 1 0 1  Control/stop worrying 1 1 0 1  Worry too much - different things 0 0 1 1  Trouble relaxing 1 1 1 1   Restless 1 1 0 1  Easily annoyed or irritable 1 0 0 0  Afraid - awful might happen 0 0 0 1  Total GAD 7 Score 5 4 2 6   Anxiety Difficulty Somewhat difficult Somewhat difficult Not difficult at all Somewhat difficult      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   Major depression, recurrent (HCC) 01/05/2017   Abnormal grief reaction 01/05/2017   Chronic pain syndrome 03/07/2016   Vitamin D deficiency 11/10/2015   Arthralgia 10/20/2015   Lumbar facet hypertrophy (Bilateral) 10/20/2015   Lumbar  facet syndrome (Location of Primary Source of Pain) (Right) 10/20/2015   Lumbar spondylosis 10/20/2015   Chronic shoulder pain (Right) 10/18/2015   Chronic low back pain (Location of Primary Source of Pain) (Right) 10/18/2015   Chronic lower extremity pain (Location of Secondary source of pain) (Right) 10/18/2015   Occipital neuralgia (Right) 10/18/2015   Cervicogenic headache 10/18/2015   Type 2 diabetes, controlled, 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   Chronic neck pain (Location of Tertiary source of pain) (Right) 09/30/2014   Cervical disc disorder with radiculopathy of cervical region 09/30/2014   Gout 08/07/2014   Hyperlipidemia 08/07/2014   Hypothyroidism 08/07/2014   Congenital scoliosis 08/07/2014   COPD (chronic obstructive pulmonary disease) (HCC)    Hypertension     Past Surgical History:  Procedure Laterality Date   ABDOMINAL HYSTERECTOMY  2003   one ovary remains: due to heavy bleeding/endometrioma/cysts   BACK SURGERY     and injections   CARPAL TUNNEL RELEASE     COLONOSCOPY WITH PROPOFOL N/A 12/09/2019   Procedure: COLONOSCOPY WITH PROPOFOL;  Surgeon: Midge Minium, MD;  Location: [facility] ENDOSCOPY;  Service: Endoscopy;  Laterality: N/A;   FOOT SURGERY Right    bone spur   POLYPECTOMY     uterine    Family History  Problem Relation Age of Onset   Alzheimer's disease Mother    Dementia Mother    Cancer Mother        colon   Heart disease Mother    Hypertension Mother    Depression Mother    Alzheimer's disease Father    Dementia Father    Hypertension Father    Cancer Brother        lung   Lung disease Brother    Hypertension Sister    Pneumonia Brother    Heart failure Brother    Breast cancer Neg Hx     Social History   Tobacco Use   Smoking status: Former    Current packs/day: 0.00    Average packs/day: 2.0 packs/day for 5.0 years (10.0 ttl pk-yrs)    Types: Cigarettes    Start date: 02/01/1986    Quit date: 02/02/1991    Years since quitting:  31.7    Passive exposure: Past   Smokeless tobacco: Never  Substance Use Topics   Alcohol use: No    Alcohol/week: 0.0 standard drinks of alcohol     Current Outpatient Medications:    ALPRAZolam (XANAX) 1 MG tablet, Take 1 tablet by mouth 2 (two) times daily., Disp: , Rfl: 2   Calcium Carb-Cholecalciferol 600-10 MG-MCG TABS, Take by mouth., Disp: , Rfl:    citalopram (CELEXA) 10 MG tablet, Take 30 mg by mouth daily., Disp: ,  Rfl:    levocetirizine (XYZAL) 5 MG tablet, TAKE 1 TABLET BY MOUTH EVERY DAY IN THE EVENING, Disp: 90 tablet, Rfl: 0   nystatin powder, Apply topically to rash two to three times a day, Disp: 30 g, Rfl: 1   oxybutynin (DITROPAN-XL) 10 MG 24 hr tablet, Take 1 tablet (10 mg total) by mouth daily., Disp: 90 tablet, Rfl: 3   traZODone (DESYREL) 100 MG tablet, Take 50-100 mg by mouth at bedtime., Disp: , Rfl:    triamcinolone ointment (KENALOG) 0.1 %, Apply 1 application topically 2 (two) times daily as needed., Disp: 30 g, Rfl: 2   valsartan (DIOVAN) 80 MG tablet, TAKE 1 TABLET (80 MG TOTAL) BY MOUTH DAILY. FOR BLOOD PRESSURE, Disp: 90 tablet, Rfl: 3   vitamin B-12 (CYANOCOBALAMIN) 1000 MCG tablet, Take 1,000 mcg by mouth daily., Disp: , Rfl:    albuterol (VENTOLIN HFA) 108 (90 Base) MCG/ACT inhaler, Inhale 2 puffs into the lungs every 4 (four) hours as needed for wheezing or shortness of breath., Disp: 1 each, Rfl: 0   alendronate (FOSAMAX) 70 MG tablet, TAKE 1 TABLET BY MOUTH EVERY 7 (SEVEN) DAYS. TAKE WITH A FULL GLASS OF WATER ON AN EMPTY STOMACH., Disp: 12 tablet, Rfl: 3   Fluticasone-Umeclidin-Vilant (TRELEGY ELLIPTA) 200-62.5-25 MCG/ACT AEPB, Inhale 1 puff into the lungs daily. Consistent time of day for COPD, Disp: 28 each, Rfl: 3   levothyroxine (SYNTHROID) 50 MCG tablet, Take 1 tablet (50 mcg total) by mouth as directed. TAKE 1 TABLET (50 MCG) BEFORE BREAKFAST X 5 DAYS A WEEK AND TAKE 1.5 TABLETS (75 MCG) BEFORE BREAKFAST 2X A WEEK, Disp: 103 tablet, Rfl: 0    rosuvastatin (CRESTOR) 40 MG tablet, Take 1 tablet (40 mg total) by mouth daily. For cholesterol, Disp: 90 tablet, Rfl: 3  Allergies  Allergen Reactions   Contrast Media [Iodinated Contrast Media] Shortness Of Breath   Aspirin Nausea And Vomiting   Macrobid [Nitrofurantoin Monohyd Macro] Hives and Nausea Only   Family History  Problem Relation Age of Onset   Alzheimer's disease Mother    Dementia Mother    Cancer Mother        colon   Heart disease Mother    Hypertension Mother    Depression Mother    Alzheimer's disease Father    Dementia Father    Hypertension Father    Cancer Brother        lung   Lung disease Brother    Hypertension Sister    Pneumonia Brother    Heart failure Brother    Breast cancer Neg Hx      Hypertension: BP Readings from Last 3 Encounters:  10/16/22 122/78  03/06/22 126/83  02/02/22 126/82   Obesity: Wt Readings from Last 3 Encounters:  10/16/22 128 lb 3.2 oz (58.2 kg)  04/06/22 128 lb (58.1 kg)  03/06/22 128 lb (58.1 kg)   BMI Readings from Last 3 Encounters:  10/16/22 24.22 kg/m  04/06/22 24.19 kg/m  03/06/22 24.19 kg/m     Vaccines:   HPV: aged out  Tdap: discontinued  Shingrix: Declined/ postponed  Pneumonia: declined/postponed  Flu: completed today  COVID-19: Discussed vaccine and booster recommendations per available CDC guidelines     Hep C Screening: completed HIV screening: completed  STD testing and prevention (HIV/chl/gon/syphilis): declines screening today  Intimate partner violence: negative Sexual History : Menstrual History/LMP/Abnormal Bleeding:  Discussed importance of follow up if any post-menopausal bleeding: yes- she declines postmenopausal bleeding or spotting. She is post  hysterectomy  Incontinence Symptoms: Yes.  - she sees urology for this   Breast cancer hx:  - Last Mammogram: She has not scheduled for this year- order placed today  - BRCA gene screening:   Osteoporosis Prevention :  Discussed high calcium and vitamin D supplementation, weight bearing exercises Bone density :yes- ordered today   Cervical cancer screening: NA- sp hysterectomy   Skin cancer: Discussed monitoring for atypical lesions  Colorectal cancer screening: UTD- next due in 2026    Lung cancer:  Low Dose CT Chest recommended if Age 50-80 years, 20 pack-year currently smoking OR have quit w/in 15years. Patient does not qualify.   ECG: NA  Advanced Care Planning: A voluntary discussion about advance care planning including the explanation and discussion of advance directives.  Discussed health care proxy and Living will, and the patient was able to identify a health care proxy as no one.  Patient does not have a living will in effect.     I personally reviewed active problem list, medication list, allergies, health maintenance, notes from last encounter, lab results with the patient/caregiver today.   Review of Systems  Constitutional:  Negative for malaise/fatigue and weight loss.  Eyes:  Positive for blurred vision and double vision.  Respiratory:  Negative for shortness of breath and wheezing.   Cardiovascular:  Negative for chest pain, palpitations and leg swelling.  Gastrointestinal:  Negative for constipation, diarrhea, nausea and vomiting.  Neurological:  Positive for headaches. Negative for dizziness, tingling and tremors.  Psychiatric/Behavioral:  Positive for depression. Negative for substance abuse and suicidal ideas. The patient is nervous/anxious.       Objective  Vitals:   10/16/22 1413  BP: 122/78  Pulse: 86  Resp: 12  Temp: 98.1 F (36.7 C)  TempSrc: Oral  SpO2: 96%  Weight: 128 lb 3.2 oz (58.2 kg)  Height: 5\' 1"  (1.549 m)    Body mass index is 24.22 kg/m.  Physical Exam Vitals reviewed.  Constitutional:      General: She is awake.     Appearance: Normal appearance. She is well-developed and well-groomed.  HENT:     Head: Normocephalic and atraumatic.      Mouth/Throat:     Lips: Pink.     Mouth: Mucous membranes are moist.  Eyes:     Extraocular Movements: Extraocular movements intact.     Conjunctiva/sclera: Conjunctivae normal.     Pupils: Pupils are equal, round, and reactive to light.  Cardiovascular:     Rate and Rhythm: Normal rate and regular rhythm.     Pulses: Normal pulses.          Radial pulses are 2+ on the right side and 2+ on the left side.     Heart sounds: Normal heart sounds.  Pulmonary:     Effort: Pulmonary effort is normal.     Breath sounds: Normal breath sounds. No decreased air movement. No decreased breath sounds, wheezing, rhonchi or rales.  Musculoskeletal:     Cervical back: Normal range of motion and neck supple.     Right lower leg: No edema.     Left lower leg: No edema.  Skin:    General: Skin is warm and dry.  Neurological:     General: No focal deficit present.     Mental Status: She is alert and oriented to person, place, and time.     GCS: GCS eye subscore is 4. GCS verbal subscore is 5. GCS motor subscore is 6.  Cranial Nerves: No cranial nerve deficit, dysarthria or facial asymmetry.     Gait: Gait is intact.  Psychiatric:        Attention and Perception: Attention and perception normal.        Mood and Affect: Mood and affect normal.        Speech: Speech normal.        Behavior: Behavior normal. Behavior is cooperative.        Thought Content: Thought content normal.        Cognition and Memory: Cognition normal.        Judgment: Judgment normal.      No results found for this or any previous visit (from the past 2160 hour(s)).   PHQ2/9:    10/16/2022    2:13 PM 04/06/2022    8:44 AM 02/02/2022    1:30 PM 10/03/2021    1:31 PM 04/19/2021    2:39 PM  Depression screen PHQ 2/9  Decreased Interest 1 1 1 1 1   Down, Depressed, Hopeless 1 1 1 1 1   PHQ - 2 Score 2 2 2 2 2   Altered sleeping 1 1 1 1  0  Tired, decreased energy 1 1 1 1 1   Change in appetite 0 0 0 0 0  Feeling bad or  failure about yourself  0 0 0 0 0  Trouble concentrating 1 0 0 0 1  Moving slowly or fidgety/restless 0 0 1 0 0  Suicidal thoughts 0 0 0 0 0  PHQ-9 Score 5 4 5 4 4   Difficult doing work/chores Somewhat difficult Not difficult at all Somewhat difficult Not difficult at all Somewhat difficult      Fall Risk:    10/16/2022    2:12 PM 04/06/2022    8:40 AM 02/02/2022    1:19 PM 10/03/2021    1:30 PM 04/19/2021    2:39 PM  Fall Risk   Falls in the past year? 0 1 1 0 0  Number falls in past yr: 0 0 1 0 0  Injury with Fall? 0 0 0 0 0  Risk for fall due to : No Fall Risks No Fall Risks  No Fall Risks No Fall Risks  Follow up Falls evaluation completed;Education provided;Falls prevention discussed Education provided;Falls prevention discussed Falls evaluation completed Falls prevention discussed;Education provided Falls prevention discussed      Functional Status Survey: Is the patient deaf or have difficulty hearing?: No Does the patient have difficulty seeing, even when wearing glasses/contacts?: Yes Does the patient have difficulty concentrating, remembering, or making decisions?: Yes Does the patient have difficulty walking or climbing stairs?: Yes Does the patient have difficulty dressing or bathing?: No Does the patient have difficulty doing errands alone such as visiting a doctor's office or shopping?: No    Assessment & Plan  Problem List Items Addressed This Visit       Cardiovascular and Mediastinum   Hypertension (Chronic)    Chronic, historic condition Appears well controlled on current regimen comprised of Valsartan 80 mg PO every day  Continue current regimen Continue checking BP at home for monitoring Follow up in 6 months or sooner if concerns arise        Relevant Medications   rosuvastatin (CRESTOR) 40 MG tablet   Other Relevant Orders   CBC w/Diff/Platelet   COMPLETE METABOLIC PANEL WITH GFR     Respiratory   COPD (chronic obstructive pulmonary disease)  (HCC) (Chronic)    Chronic, historic condition Appears well managed  on current regimen of trelegy - refills for this and rescue inhaler provided today Continue current regimen Continue with weight loss efforts Follow up in 6 months or sooner if concerns arise        Relevant Medications   Fluticasone-Umeclidin-Vilant (TRELEGY ELLIPTA) 200-62.5-25 MCG/ACT AEPB   albuterol (VENTOLIN HFA) 108 (90 Base) MCG/ACT inhaler     Endocrine   Hypothyroidism (Chronic)    Chronic, historic condition Appears well managed with current regimen comprised of Levothyroxine 50 mcg PO every day 5 days per week and 75 mcg PO daily for 2 days of the week Check TSH today for monitoring  Results to dictate further management  Continue current regimen for now Follow up in 6 months or sooner if concerns arise        Relevant Medications   levothyroxine (SYNTHROID) 50 MCG tablet   Other Relevant Orders   TSH   Type 2 diabetes, controlled, with neuropathy (HCC) (Chronic)    Reviewed A1c results going back to 2016- cannot find A1c over 6.4 Most recent A1c was 5.5  She is not taking medications at this time and glucose appears to be controlled with diet and exercise Continue these measures  Recheck A1c and microalbumin today Foot exam due at next apt Follow up in 6 months or sooner if concerns arise        Relevant Medications   rosuvastatin (CRESTOR) 40 MG tablet   Other Relevant Orders   Urine Microalbumin w/creat. ratio   HgB A1c     Musculoskeletal and Integument   Osteoporosis    Chronic, historic condition Appears well managed with Vitamin D and Calcium supplement as well as Fosamax  Refills provided today- continue current regimen as well as exercise  Orders placed for Dexa scan as she is due this year Results to dictate further management        Relevant Medications   alendronate (FOSAMAX) 70 MG tablet   Other Relevant Orders   DG Bone Density     Other   Hyperlipidemia (Chronic)     Chronic, historic condition Appears well managed on current regimen of Rosuvastatin 40 mg PO every day  Recheck lipid panel today- Results to dictate further management  Follow up in 6 months or sooner if concerns arise        Relevant Medications   rosuvastatin (CRESTOR) 40 MG tablet   Other Relevant Orders   Lipid Profile   Other Visit Diagnoses     Annual physical exam    -  Primary  -USPSTF grade A and B recommendations reviewed with patient; age-appropriate recommendations, preventive care, screening tests, etc discussed and encouraged; healthy living encouraged; see AVS for patient education given to patient -Discussed importance of 150 minutes of physical activity weekly, eat two servings of fish weekly, eat one serving of tree nuts ( cashews, pistachios, pecans, almonds.Marland Kitchen) every other day, eat 6 servings of fruit/vegetables daily and drink plenty of water and avoid sweet beverages.   -Reviewed Health Maintenance: Yes.      Need for immunization against influenza       Relevant Orders   Flu vaccine trivalent PF, 6mos and older(Flulaval,Afluria,Fluarix,Fluzone) (Completed)   Encounter for screening mammogram for malignant neoplasm of breast       Relevant Orders   MM 3D SCREENING MAMMOGRAM BILATERAL BREAST        Return in about 6 months (around 04/15/2023) for DM, HLD, HTN, Depression, anxiety, hypothyroidism .   I, Saori Umholtz E Taylah Dubiel, PA-C,  have reviewed all documentation for this visit. The documentation on 10/16/22 for the exam, diagnosis, procedures, and orders are all accurate and complete.   Jacquelin Hawking, MHS, PA-C [facility]

## 2022-10-16 NOTE — Assessment & Plan Note (Signed)
Chronic, historic condition Appears well managed on current regimen of trelegy - refills for this and rescue inhaler provided today Continue current regimen Continue with weight loss efforts Follow up in 6 months or sooner if concerns arise

## 2022-10-17 LAB — CBC WITH DIFFERENTIAL/PLATELET
Absolute Monocytes: 453 {cells}/uL (ref 200–950)
Basophils Absolute: 31 {cells}/uL (ref 0–200)
Basophils Relative: 0.5 %
Eosinophils Absolute: 180 {cells}/uL (ref 15–500)
Eosinophils Relative: 2.9 %
HCT: 39.5 % (ref 35.0–45.0)
Hemoglobin: 12.7 g/dL (ref 11.7–15.5)
Lymphs Abs: 1848 {cells}/uL (ref 850–3900)
MCH: 28.8 pg (ref 27.0–33.0)
MCHC: 32.2 g/dL (ref 32.0–36.0)
MCV: 89.6 fL (ref 80.0–100.0)
MPV: 10.5 fL (ref 7.5–12.5)
Monocytes Relative: 7.3 %
Neutro Abs: 3689 {cells}/uL (ref 1500–7800)
Neutrophils Relative %: 59.5 %
Platelets: 268 10*3/uL (ref 140–400)
RBC: 4.41 10*6/uL (ref 3.80–5.10)
RDW: 12.5 % (ref 11.0–15.0)
Total Lymphocyte: 29.8 %
WBC: 6.2 10*3/uL (ref 3.8–10.8)

## 2022-10-17 LAB — COMPLETE METABOLIC PANEL WITH GFR
AG Ratio: 1.5 (calc) (ref 1.0–2.5)
ALT: 11 U/L (ref 6–29)
AST: 18 U/L (ref 10–35)
Albumin: 4 g/dL (ref 3.6–5.1)
Alkaline phosphatase (APISO): 46 U/L (ref 37–153)
BUN: 17 mg/dL (ref 7–25)
CO2: 29 mmol/L (ref 20–32)
Calcium: 9.2 mg/dL (ref 8.6–10.4)
Chloride: 103 mmol/L (ref 98–110)
Creat: 0.57 mg/dL (ref 0.50–1.05)
Globulin: 2.7 g/dL (ref 1.9–3.7)
Glucose, Bld: 88 mg/dL (ref 65–99)
Potassium: 4 mmol/L (ref 3.5–5.3)
Sodium: 140 mmol/L (ref 135–146)
Total Bilirubin: 0.6 mg/dL (ref 0.2–1.2)
Total Protein: 6.7 g/dL (ref 6.1–8.1)
eGFR: 101 mL/min/{1.73_m2} (ref >=60)

## 2022-10-17 LAB — HEMOGLOBIN A1C
Hgb A1c MFr Bld: 5.7 %{Hb} — ABNORMAL HIGH (ref <5.7)
Mean Plasma Glucose: 117 mg/dL
eAG (mmol/L): 6.5 mmol/L

## 2022-10-17 LAB — LIPID PANEL
Cholesterol: 203 mg/dL — ABNORMAL HIGH (ref <200)
HDL: 40 mg/dL — ABNORMAL LOW (ref >=50)
LDL Cholesterol (Calc): 136 mg/dL — ABNORMAL HIGH
Non-HDL Cholesterol (Calc): 163 mg/dL — ABNORMAL HIGH (ref <130)
Total CHOL/HDL Ratio: 5.1 (calc) — ABNORMAL HIGH (ref <5.0)
Triglycerides: 144 mg/dL (ref <150)

## 2022-10-17 LAB — TSH: TSH: 1.44 m[IU]/L (ref 0.40–4.50)

## 2022-10-17 LAB — MICROALBUMIN / CREATININE URINE RATIO
Creatinine, Urine: 111 mg/dL (ref 20–275)
Microalb Creat Ratio: 8 mg/g{creat} (ref <30)
Microalb, Ur: 0.9 mg/dL

## 2022-10-18 NOTE — Progress Notes (Signed)
Your labs are back  Your cholesterol has improved since your last check but is still elevated - please reduce your intake of saturated fats and make sure you are exercising as tolerated. Please continue your Rosuvastatin as directed Your A1c is 5.7 which is in prediabetic range. Please make sure you are reducing your sugar intake and exercising regularly to help manage this  Your urine creatinine was good and normal meaning you are not releasing protein into your urine Your electrolytes, liver and kidney function were overall normal at this time Your CBC was normal - no signs of anemia Your thyroid testing was normal

## 2022-10-19 ENCOUNTER — Ambulatory Visit: Payer: Self-pay

## 2022-10-19 ENCOUNTER — Ambulatory Visit: Payer: Medicare HMO | Admitting: Family Medicine

## 2022-10-19 ENCOUNTER — Encounter: Payer: Self-pay | Admitting: Family Medicine

## 2022-10-19 VITALS — BP 111/72 | HR 72 | Ht 61.0 in | Wt 136.8 lb

## 2022-10-19 DIAGNOSIS — R051 Acute cough: Secondary | ICD-10-CM

## 2022-10-19 DIAGNOSIS — R220 Localized swelling, mass and lump, head: Secondary | ICD-10-CM | POA: Diagnosis not present

## 2022-10-19 DIAGNOSIS — R052 Subacute cough: Secondary | ICD-10-CM | POA: Insufficient documentation

## 2022-10-19 LAB — POCT RAPID STREP A (OFFICE): Rapid Strep A Screen: NEGATIVE

## 2022-10-19 LAB — POC COVID19 BINAXNOW: SARS Coronavirus 2 Ag: NEGATIVE

## 2022-10-19 MED ORDER — PREDNISONE 20 MG PO TABS
ORAL_TABLET | ORAL | 0 refills | Status: AC
Start: 2022-10-19 — End: 2022-10-25

## 2022-10-19 NOTE — Progress Notes (Signed)
Established patient visit   Patient: Deborah Blackwell   DOB: [birth year]   62 y.o. Female  MRN: [id number] Visit Date: 10/19/2022  Today's healthcare provider: Ronnald Ramp, MD   Chief Complaint  Patient presents with   Medical Management of Chronic Issues    Cough, sneezing, watery eyes, and chest congestion,  drainage in the nostrils ,started Tuesday evening, prior treatment has been cough drops and Nexium, Thinks maybe related to flu vaccine that she received on Monday    Subjective     HPI     Medical Management of Chronic Issues    Additional comments: Cough, sneezing, watery eyes, and chest congestion,  drainage in the nostrils ,started Tuesday evening, prior treatment has been cough drops and Nexium, Thinks maybe related to flu vaccine that she received on Monday       Last edited by Rolly Salter, CMA on 10/19/2022  2:40 PM.       Discussed the use of AI scribe software for clinical note transcription with the patient, who gave verbal consent to proceed.  History of Present Illness   The patient, with a history of COPD, presented with symptoms that began after receiving a flu shot earlier in the week. She reported experiencing coughing, sneezing, congestion, and watery eyes. Additionally, she noted that her lips swelled up and became red and chapped, although the swelling has since subsided. These symptoms started on Tuesday evening, following a flu shot administered on Monday afternoon. This is the first time the patient has had a reaction to a flu shot, which she has been receiving all her life.  The patient denied having a fever, although she has been taking Tylenol, which may have managed any potential fever. She reported feeling feverish and experiencing chills on the day prior to the consultation, but not on the day of the consultation. The patient denied any known allergies, except for aspirin, which upsets her stomach, and contrast dye.  The patient  reported that her breathing has been slightly worse since the onset of symptoms, but she attributed this to her existing COPD. She denied any muscle aches but reported having a headache. She also reported a new cough, which she described as aggravating and persistent. The patient has been taking Xyzal 5mg  for allergies.  The patient also reported irritation in both ears and a sensation of something draining down the back of her throat. Her nose did not appear runny, although she reported it had been running.         Past Medical History:  Diagnosis Date   Angina at rest [facility])    Arthritis    Asthma    Back pain with right-sided sciatica 09/30/2014   Bipolar disorder (HCC)    Congenital scoliosis    COPD (chronic obstructive pulmonary disease) (HCC)    DDD (degenerative disc disease), lumbar    Depression    Elevated C-reactive protein (CRP) 11/10/2015   GERD (gastroesophageal reflux disease)    Gout    History of diabetes mellitus    previously on medication, removed once pt lost weight, A1c WNL > 2 yrs   History of kidney stones    History of scoliosis    was casted as a child   Hyperlipidemia    Hypertension    Lumbar radiculopathy 09/30/2014   Migraines    Panic attack    RA (rheumatoid arthritis) (HCC)    Type 2 diabetes, controlled, 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   alendronate (FOSAMAX) 70 MG tablet TAKE 1 TABLET BY MOUTH EVERY 7 (SEVEN) DAYS. TAKE WITH A FULL GLASS OF WATER ON AN EMPTY STOMACH.   ALPRAZolam (XANAX) 1 MG tablet Take 1 tablet by mouth 2 (two) times daily.   Calcium Carb-Cholecalciferol 600-10 MG-MCG TABS Take by mouth.   citalopram (CELEXA) 10 MG tablet Take 30 mg by mouth daily.   Fluticasone-Umeclidin-Vilant (TRELEGY ELLIPTA)  200-62.5-25 MCG/ACT AEPB Inhale 1 puff into the lungs daily. Consistent time of day for COPD   levocetirizine (XYZAL) 5 MG tablet TAKE 1 TABLET BY MOUTH EVERY DAY IN THE EVENING   levothyroxine (SYNTHROID) 50 MCG tablet Take 1 tablet (50 mcg total) by mouth as directed. TAKE 1 TABLET (50 MCG) BEFORE BREAKFAST X 5 DAYS A WEEK AND TAKE 1.5 TABLETS (75 MCG) BEFORE BREAKFAST 2X A WEEK   nystatin powder Apply topically to rash two to three times a day   oxybutynin (DITROPAN-XL) 10 MG 24 hr tablet Take 1 tablet (10 mg total) by mouth daily.   rosuvastatin (CRESTOR) 40 MG tablet Take 1 tablet (40 mg total) by mouth daily. For cholesterol   traZODone (DESYREL) 100 MG tablet Take 50-100 mg by mouth at bedtime.   triamcinolone ointment (KENALOG) 0.1 % Apply 1 application topically 2 (two) times daily as needed.   valsartan (DIOVAN) 80 MG tablet TAKE 1 TABLET (80 MG TOTAL) BY MOUTH DAILY. FOR BLOOD PRESSURE   vitamin B-12 (CYANOCOBALAMIN) 1000 MCG tablet Take 1,000 mcg by mouth daily.   No facility-administered medications prior to visit.    Review of Systems      Objective    BP 111/72 (BP Location: Left Arm, Patient Position: Sitting, Cuff Size: Normal)   Pulse 72   Ht 5\' 1"  (1.549 m)   Wt 136 lb 12.8 oz (62.1 kg)   SpO2 98%   BMI 25.85 kg/m     Physical Exam HENT:     Head:     Comments: Erythematous rim superior lip with fine scale    Right Ear: Tympanic membrane normal. There is no impacted cerumen. Tympanic membrane is not erythematous or bulging.     Left Ear: Tympanic membrane normal. There is no impacted cerumen. Tympanic membrane is not erythematous or bulging.     Nose: Nasal deformity present. No rhinorrhea.     Right Turbinates: Not swollen.     Left Turbinates: Not swollen.     Mouth/Throat:     Mouth: Mucous membranes are moist. No oral lesions.     Pharynx: No oropharyngeal exudate or posterior oropharyngeal erythema.     Tonsils: No tonsillar exudate.        Results for orders placed or performed in visit on 10/19/22  POC COVID-19  Result Value Ref Range   SARS Coronavirus 2 Ag Negative Negative  POCT rapid strep A  Result Value Ref Range   Rapid Strep A Screen Negative Negative    Assessment & Plan     Problem List Items Addressed This Visit     Subacute cough - Primary   Relevant Medications   predniSONE (DELTASONE) 20 MG tablet   Other Visit Diagnoses     Swelling of upper lip       Relevant Medications   predniSONE (DELTASONE) 20  MG tablet           Post-vaccination reaction  Viral URI  Symptoms of coughing, sneezing, congestion, and lip swelling following recent flu vaccination. No known allergies to vaccine components. No difficulty breathing or throat swelling. Negative for COVID and flu. acute, pt stable  -Prescribe prednisone taper:40mg  for 2 days, 20mg  for 2 days, and 10mg  for 1 day. -Advise patient to seek immediate medical attention if symptoms worsen or if there is difficulty breathing.         Return if symptoms worsen or fail to improve.         Ronnald Ramp, MD  [facility] [phone] (phone) [phone] (fax)  [facility]

## 2022-10-19 NOTE — Telephone Encounter (Signed)
  Chief Complaint: Productive cough Symptoms: Moderate productive cough, sneezing, post nasal drip, congestion Frequency: constant onset Tuesday Pertinent Negatives: Patient denies fever, chest pain, nausea, vomiting Disposition: [] ED /[] Urgent Care (no appt availability in office) / [x] Appointment(In office/virtual)/ []  [facility]/ [] Home Care/ [] Refused Recommended Disposition /[] [facility]/ []  Follow-up with PCP Additional Notes: Patient reports productive cough, sneezing, post nasal drip, and congestion with onset of Tuesday. Patient states she had her flu shot on Monday and is unsure of it is the cause of her symptoms. Patient coughing a lot during call hard to get through a sentence. Patient reports taking Delsym, tylenol and an anti-histamine that has not improved her symptoms. Car advice given and patient has been scheduled for an appointment today at [facility] due to no availability in office . Summary: Congestion, cough, sore throat, sneezing   The patient states she started getting symptoms after her flu shot but doesn't know if it is from the shot. She says since Tuesday she has been congested, has a sore throat, a cough and been sneezing. Please assist her further.     Reason for Disposition  [1] Continuous (nonstop) coughing interferes with work or school AND [2] no improvement using cough treatment per Care Advice  Answer Assessment - Initial Assessment Questions 1. ONSET: "When did the cough begin?"      Tuesday night  2. SEVERITY: "How bad is the cough today?"      Moderate 3. SPUTUM: "Describe the color of your sputum" (none, dry cough; clear, white, yellow, green)     white 4. HEMOPTYSIS: "Are you coughing up any blood?" If so ask: "How much?" (flecks, streaks, tablespoons, etc.)     No  5. DIFFICULTY BREATHING: "Are you having difficulty breathing?" If Yes, ask: "How bad is it?" (e.g., mild, moderate, severe)    - MILD: No SOB at rest, mild SOB with  walking, speaks normally in sentences, can lie down, no retractions, pulse < 100.    - MODERATE: SOB at rest, SOB with minimal exertion and prefers to sit, cannot lie down flat, speaks in phrases, mild retractions, audible wheezing, pulse 100-120.    - SEVERE: Very SOB at rest, speaks in single words, struggling to breathe, sitting hunched forward, retractions, pulse > 120      No  6. FEVER: "Do you have a fever?" If Yes, ask: "What is your temperature, how was it measured, and when did it start?"     No  7. CARDIAC HISTORY: "Do you have any history of heart disease?" (e.g., heart attack, congestive heart failure)      No 8. LUNG HISTORY: "Do you have any history of lung disease?"  (e.g., pulmonary embolus, asthma, emphysema)     COPD  9. PE RISK FACTORS: "Do you have a history of blood clots?" (or: recent major surgery, recent prolonged travel, bedridden)     No 10. OTHER SYMPTOMS: "Do you have any other symptoms?" (e.g., runny nose, wheezing, chest pain)        Congestion, post nasal drip, sneezing, lips really dry and red  Protocols used: Cough - Acute Productive-A-AH

## 2022-10-19 NOTE — Patient Instructions (Signed)
VISIT SUMMARY:  During your visit, we discussed your recent symptoms that began after receiving a flu shot. These symptoms included coughing, sneezing, congestion, and lip swelling. We also discussed your existing condition of Chronic Obstructive Pulmonary Disease (COPD), which you felt had slightly worsened due to these symptoms.  YOUR PLAN:  -POST-VACCINATION REACTION: This refers to the symptoms you experienced after receiving your flu shot. To help manage these symptoms, I have prescribed a course of prednisone, a medication that reduces inflammation and suppresses the immune system. It's important to seek immediate medical attention if your symptoms worsen or if you have difficulty breathing.  -CHRONIC OBSTRUCTIVE PULMONARY DISEASE (COPD): COPD is a chronic lung disease that makes it hard to breathe. Your symptoms may have been exacerbated by your recent post-vaccination reaction. We will continue with your current COPD management plan. Again, if your symptoms worsen or if you have difficulty breathing, seek immediate medical attention.  INSTRUCTIONS:  Please start taking the prescribed prednisone as directed: 40mg  for 2 days, 20mg  for 2 days, and 10mg  for 1 day. Continue with your current COPD management. Remember to seek immediate medical attention if your symptoms worsen or if you have difficulty breathing.

## 2022-10-20 NOTE — Telephone Encounter (Signed)
Please advice  

## 2022-11-11 ENCOUNTER — Other Ambulatory Visit: Payer: Self-pay | Admitting: Physician Assistant

## 2022-11-13 NOTE — Telephone Encounter (Signed)
Requested Prescriptions  Pending Prescriptions Disp Refills   albuterol (VENTOLIN HFA) 108 (90 Base) MCG/ACT inhaler [Pharmacy Med Name: ALBUTEROL HFA (VENTOLIN) INH] 18 each 0    Sig: INHALE 2 PUFFS INTO THE LUNGS EVERY 4 HOURS AS NEEDED FOR WHEEZING OR SHORTNESS OF BREATH.     Pulmonology:  Beta [facility]on[facility] 2 Passed - 11/11/2022  9:19 AM      Passed - Last BP in normal range    BP Readings from Last 1 Encounters:  10/19/22 111/72         Passed - Last Heart Rate in normal range    Pulse Readings from Last 1 Encounters:  10/19/22 72         Passed - Valid encounter within last 12 months    Recent Outpatient Visits           3 weeks ago Acute cough   [facility] Simmons-Robinson, [place], MD   4 weeks ago Annual physical exam   [facility] Mecum, Oswaldo Conroy, PA-C   9 months ago Primary hypertension   [facility] Berniece Salines, FNP   1 year ago Primary hypertension   [facility] Danelle Berry, PA-C   1 year ago Urinary incontinence, unspecified type   [facility] Danelle Berry, PA-C       Future Appointments             In 5 months Danelle Berry, PA-C [facility], [facility]

## 2022-11-19 ENCOUNTER — Other Ambulatory Visit: Payer: Self-pay | Admitting: Family Medicine

## 2022-11-19 DIAGNOSIS — J329 Chronic sinusitis, unspecified: Secondary | ICD-10-CM

## 2022-11-20 DIAGNOSIS — E113393 Type 2 diabetes mellitus with moderate nonproliferative diabetic retinopathy without macular edema, bilateral: Secondary | ICD-10-CM | POA: Diagnosis not present

## 2023-03-19 DIAGNOSIS — H25813 Combined forms of age-related cataract, bilateral: Secondary | ICD-10-CM | POA: Diagnosis not present

## 2023-04-05 DIAGNOSIS — Z634 Disappearance and death of family member: Secondary | ICD-10-CM | POA: Diagnosis not present

## 2023-04-05 DIAGNOSIS — F419 Anxiety disorder, unspecified: Secondary | ICD-10-CM | POA: Diagnosis not present

## 2023-04-05 DIAGNOSIS — F339 Major depressive disorder, recurrent, unspecified: Secondary | ICD-10-CM | POA: Diagnosis not present

## 2023-04-12 ENCOUNTER — Ambulatory Visit (INDEPENDENT_AMBULATORY_CARE_PROVIDER_SITE_OTHER)

## 2023-04-12 DIAGNOSIS — Z0000 Encounter for general adult medical examination without abnormal findings: Secondary | ICD-10-CM | POA: Diagnosis not present

## 2023-04-12 NOTE — Progress Notes (Signed)
 Subjective:   Deborah Blackwell is a 63 y.o. who presents for a Medicare Wellness preventive visit.  Visit Complete: Virtual I connected with  Deborah Blackwell on 04/12/23 by a audio enabled telemedicine application and verified that I am speaking with the correct person using two identifiers.  Patient Location: Home  Provider Location: Office/Clinic  I discussed the limitations of evaluation and management by telemedicine. The patient expressed understanding and agreed to proceed.  Vital Signs: Because this visit was a virtual/telehealth visit, some criteria may be missing or patient reported. Any vitals not documented were not able to be obtained and vitals that have been documented are patient reported.  VideoDeclined- This patient declined Librarian, academic. Therefore the visit was completed with audio only.  Persons Participating in Visit: Patient.  AWV Questionnaire: No: Patient Medicare AWV questionnaire was not completed prior to this visit.  Cardiac Risk Factors include: advanced age (>52men, >41 women);diabetes mellitus;dyslipidemia;hypertension     Objective:    Today's Vitals   04/12/23 0811  PainSc: 3    There is no height or weight on file to calculate BMI.     04/12/2023    8:17 AM 04/06/2022    8:55 AM 03/31/2021   10:22 AM 03/30/2020   10:54 AM 12/09/2019    8:46 AM 03/27/2019   10:54 AM 03/22/2018    2:11 PM  Advanced Directives  Does Patient Have a Medical Advance Directive? No Yes No No No No No  Would patient like information on creating a medical advance directive? No - Patient declined  No - Patient declined No - Patient declined  No - Patient declined No - Patient declined    Current Medications (verified) Outpatient Encounter Medications as of 04/12/2023  Medication Sig   albuterol (VENTOLIN HFA) 108 (90 Base) MCG/ACT inhaler INHALE 2 PUFFS INTO THE LUNGS EVERY 4 HOURS AS NEEDED FOR WHEEZING OR SHORTNESS OF BREATH.    alendronate (FOSAMAX) 70 MG tablet TAKE 1 TABLET BY MOUTH EVERY 7 (SEVEN) DAYS. TAKE WITH A FULL GLASS OF WATER ON AN EMPTY STOMACH.   ALPRAZolam (XANAX) 1 MG tablet Take 1 tablet by mouth 2 (two) times daily.   Calcium Carb-Cholecalciferol 600-10 MG-MCG TABS Take by mouth.   citalopram (CELEXA) 10 MG tablet Take 30 mg by mouth daily.   Fluticasone-Umeclidin-Vilant (TRELEGY ELLIPTA) 200-62.5-25 MCG/ACT AEPB Inhale 1 puff into the lungs daily. Consistent time of day for COPD   levocetirizine (XYZAL) 5 MG tablet TAKE 1 TABLET BY MOUTH EVERY DAY IN THE EVENING   levothyroxine (SYNTHROID) 50 MCG tablet Take 1 tablet (50 mcg total) by mouth as directed. TAKE 1 TABLET (50 MCG) BEFORE BREAKFAST X 5 DAYS A WEEK AND TAKE 1.5 TABLETS (75 MCG) BEFORE BREAKFAST 2X A WEEK   nystatin powder Apply topically to rash two to three times a day   rosuvastatin (CRESTOR) 40 MG tablet Take 1 tablet (40 mg total) by mouth daily. For cholesterol   triamcinolone ointment (KENALOG) 0.1 % Apply 1 application topically 2 (two) times daily as needed.   valsartan (DIOVAN) 80 MG tablet TAKE 1 TABLET (80 MG TOTAL) BY MOUTH DAILY. FOR BLOOD PRESSURE   vitamin B-12 (CYANOCOBALAMIN) 1000 MCG tablet Take 1,000 mcg by mouth daily.   oxybutynin (DITROPAN-XL) 10 MG 24 hr tablet Take 1 tablet (10 mg total) by mouth daily. (Patient not taking: Reported on 04/12/2023)   traZODone (DESYREL) 100 MG tablet Take 50-100 mg by mouth at bedtime. (Patient not taking:  Reported on 04/12/2023)   No facility-administered encounter medications on file as of 04/12/2023.    Allergies (verified) Contrast media [iodinated contrast media], Aspirin, and Macrobid [nitrofurantoin monohyd macro]   History: Past Medical History:  Diagnosis Date   [facility])    Arthritis    Asthma    Back pain with right-sided sciatica 09/30/2014   Bipolar disorder (HCC)    Congenital scoliosis    COPD (chronic obstructive pulmonary disease) (HCC)    DDD  (degenerative disc disease), lumbar    Depression    Elevated C-reactive protein (CRP) 11/10/2015   GERD (gastroesophageal reflux disease)    Gout    History of diabetes mellitus    previously on medication, removed once pt lost weight, A1c WNL > 2 yrs   History of kidney stones    History of scoliosis    was casted as a child   Hyperlipidemia    Hypertension    Lumbar radiculopathy 09/30/2014   Migraines    Panic attack    RA (rheumatoid arthritis) (HCC)    Type 2 diabetes, controlled, 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   Past Surgical History:  Procedure Laterality Date   ABDOMINAL HYSTERECTOMY  2003   one ovary remains: due to heavy bleeding/endometrioma/cysts   BACK SURGERY     and injections   CARPAL TUNNEL RELEASE     COLONOSCOPY WITH PROPOFOL N/A 12/09/2019   Procedure: COLONOSCOPY WITH PROPOFOL;  Surgeon: Midge Minium, MD;  Location: [facility] ENDOSCOPY;  Service: Endoscopy;  Laterality: N/A;   FOOT SURGERY Right    bone spur   POLYPECTOMY     uterine   Family History  Problem Relation Age of Onset   Alzheimer's disease Mother    Dementia Mother    Cancer Mother        colon   Heart disease Mother    Hypertension Mother    Depression Mother    Alzheimer's disease Father    Dementia Father    Hypertension Father    Cancer Brother        lung   Lung disease Brother    Hypertension Sister    Pneumonia Brother    Heart failure Brother    Breast cancer Neg Hx    Social History   Socioeconomic History   Marital status: Widowed    Spouse name: Not on file   Number of children: 0   Years of education: Not on file   Highest education level: Associate degree: academic program  Occupational History   Not on file  Tobacco Use   Smoking status: Former    Current packs/day: 0.00    Average packs/day: 2.0 packs/day for 5.0 years (10.0 ttl pk-yrs)    Types: Cigarettes    Start date: 02/01/1986    Quit date: 02/02/1991    Years since quitting: 32.2    Passive  exposure: Past   Smokeless tobacco: Never  Vaping Use   Vaping status: Never Used  Substance and Sexual Activity   Alcohol use: No    Alcohol/week: 0.0 standard drinks of alcohol   Drug use: No   Sexual activity: Not Currently  Other Topics Concern   Not on file  Social History Narrative   Lives alone.      Volunteers and [employer], [employer] food pant[facility] & Hosp[facility]   Social Drivers of Health   Financial Resource Strain: Low Risk  (04/12/2023)   Overall Financial Resource Strain (CARDIA)    Difficulty  of Paying Living Expenses: Not hard at all  Food Insecurity: No Food Insecurity (04/12/2023)   Hunger Vital Sign    Worried About Running Out of Food in the Last Year: Never true    Ran Out of Food in the Last Year: Never true  Transportation Needs: No Transportation Needs (04/12/2023)   PRAPARE - Administrator, Civil Service (Medical): No    Lack of Transportation (Non-Medical): No  Physical Activity: Sufficiently Active (04/12/2023)   Exercise Vital Sign    Days of Exercise per Week: 7 days    Minutes of Exercise per Session: 30 min  Stress: No Stress Concern Present (04/12/2023)   [employer] of Occupational Health - Occupational Stress Questionnaire    Feeling of Stress : Only a little  Social Connections: Moderately Integrated (04/12/2023)   Social Connection and Isolation Panel [NHANES]    Frequency of Communication with Friends and Family: More than three times a week    Frequency of Social Gatherings with Friends and Family: Once a week    Attends Religious Services: More than 4 times per year    Active Member of [employer] or Organizations: Yes    Attends Banker Meetings: More than 4 times per year    Marital Status: Widowed    Tobacco Counseling Counseling given: Not Answered    Clinical Intake:  Pre-visit preparation completed: Yes  Pain : 0-10 Pain Score: 3  Pain Type: Chronic pain Pain Location: Head Pain  Descriptors / Indicators: Throbbing Pain Onset: More than a month ago Pain Frequency: Intermittent     BMI - recorded: 25.7 Nutritional Status: BMI 25 -29 Overweight Nutritional Risks: None Diabetes: Yes CBG done?: No Did pt. bring in CBG monitor from home?: No  Lab Results  Component Value Date   HGBA1C 5.7 (H) 10/16/2022   HGBA1C 5.5 02/02/2022   HGBA1C 5.5 04/19/2021     How often do you need to have someone help you when you read instructions, pamphlets, or other written materials from your doctor or pharmacy?: 1 - Never  Interpreter Needed?: No  Information entered by :: Kennedy Bucker, LPN   Activities of Daily Living     04/12/2023    8:18 AM 10/16/2022    2:13 PM  In your present state of health, do you have any difficulty performing the following activities:  Hearing? 0 0  Vision? 0 1  Difficulty concentrating or making decisions? 0 1  Walking or climbing stairs? 0 1  Dressing or bathing? 0 0  Doing errands, shopping? 0 0  Preparing Food and eating ? N   Using the Toilet? N   In the past six months, have you accidently leaked urine? N   Do you have problems with loss of bowel control? N   Managing your Medications? N   Managing your Finances? N   Housekeeping or managing your Housekeeping? N     Patient Care Team: Danelle Berry, PA-C as PCP - General (Family Medicine) Lynett Fish, MD as Referring Physician (Psychiatry) Irene Limbo., MD (Ophthalmology)  Indicate any recent Medical Services you may have received from other than [facility] providers in the past year (date may be approximate).     Assessment:   This is a routine wellness examination for Kaziyah.  Hearing/Vision screen Hearing Screening - Comments:: NO AIDS Vision Screening - Comments:: WEARS GLASSES ALL THE TIME, HAS CATARACTS- DR.BELL   Goals Addressed  This Visit's Progress    DIET - EAT MORE FRUITS AND VEGETABLES         Depression Screen     04/12/2023    8:15 AM  10/19/2022    2:54 PM 10/16/2022    2:13 PM 04/06/2022    8:44 AM 02/02/2022    1:30 PM 10/03/2021    1:31 PM 04/19/2021    2:39 PM  PHQ 2/9 Scores  PHQ - 2 Score 2 1 2 2 2 2 2   PHQ- 9 Score 3 2 5 4 5 4 4     Fall Risk     04/12/2023    8:18 AM 10/16/2022    2:12 PM 04/06/2022    8:40 AM 02/02/2022    1:19 PM 10/03/2021    1:30 PM  Fall Risk   Falls in the past year? 0 0 1 1 0  Number falls in past yr: 0 0 0 1 0  Injury with Fall? 0 0 0 0 0  Risk for fall due to : No Fall Risks No Fall Risks No Fall Risks  No Fall Risks  Follow up Falls prevention discussed;Falls evaluation completed Falls evaluation completed;Education provided;Falls prevention discussed Education provided;Falls prevention discussed Falls evaluation completed Falls prevention discussed;Education provided    MEDICARE RISK AT HOME:  Medicare Risk at Home Any stairs in or around the home?: No If so, are there any without handrails?: No Home free of loose throw rugs in walkways, pet beds, electrical cords, etc?: Yes Adequate lighting in your home to reduce risk of falls?: Yes Life alert?: No Use of a cane, walker or w/c?: No Grab bars in the bathroom?: Yes Shower chair or bench in shower?: Yes Elevated toilet seat or a handicapped toilet?: No  TIMED UP AND GO:  Was the test performed?  No  Cognitive Function: 6CIT completed        04/12/2023    8:20 AM 04/06/2022    8:56 AM 03/27/2019   10:57 AM 03/22/2018    2:15 PM  6CIT Screen  What Year? 0 points 0 points 0 points 0 points  What month? 0 points 0 points 0 points 0 points  What time? 0 points 0 points 0 points 0 points  Count back from 20 0 points 0 points 0 points 0 points  Months in reverse 0 points 0 points 0 points 0 points  Repeat phrase 0 points 0 points 0 points 0 points  Total Score 0 points 0 points 0 points 0 points    Immunizations Immunization History  Administered Date(s) Administered   Influenza, Seasonal, Injecte, Preservative Fre  10/16/2022   Influenza,inj,Quad PF,6+ Mos 09/30/2015, 10/01/2017, 10/27/2018, 11/25/2019, 10/03/2021   Influenza-Unspecified 09/17/2014, 10/11/2016, 10/24/2018, 11/02/2020   Moderna Sars-Covid-2 Vaccination 04/18/2019, 05/16/2019, 12/09/2019   PPD Test 01/18/2021, 02/08/2021   Pneumococcal Conjugate-13 05/07/2013   Pneumococcal Polysaccharide-23 09/13/2011   Tdap 11/10/2009    Screening Tests Health Maintenance  Topic Date Due   Cervical Cancer Screening (HPV/Pap Cotest)  Never done   Zoster Vaccines- Shingrix (1 of 2) Never done   OPHTHALMOLOGY EXAM  10/03/2018   FOOT EXAM  04/13/2021   DEXA SCAN  05/25/2022   COVID-19 Vaccine (4 - 2024-25 season) 09/17/2022   MAMMOGRAM  11/24/2022   HEMOGLOBIN A1C  04/15/2023   Pneumococcal Vaccine 43-31 Years old (3 of 3 - PPSV23 or PCV20) 07/19/2023   Diabetic kidney evaluation - eGFR measurement  10/16/2023   Diabetic kidney evaluation - Urine ACR  10/16/2023  Medicare Annual Wellness (AWV)  04/11/2024   Colonoscopy  12/08/2024   INFLUENZA VACCINE  Completed   Hepatitis C Screening  Completed   HIV Screening  Completed   HPV VACCINES  Aged Out   DTaP/Tdap/Td  Discontinued    Health Maintenance  Health Maintenance Due  Topic Date Due   Cervical Cancer Screening (HPV/Pap Cotest)  Never done   Zoster Vaccines- Shingrix (1 of 2) Never done   OPHTHALMOLOGY EXAM  10/03/2018   FOOT EXAM  04/13/2021   DEXA SCAN  05/25/2022   COVID-19 Vaccine (4 - 2024-25 season) 09/17/2022   MAMMOGRAM  11/24/2022   Health Maintenance Items Addressed: Needs mammogram & BDS (has referrals)  Additional Screening:  Vision Screening: Recommended annual ophthalmology exams for early detection of glaucoma and other disorders of the eye.  Dental Screening: Recommended annual dental exams for proper oral hygiene  Community Resource Referral / Chronic Care Management: CRR required this visit?  No   CCM required this visit?  No     Plan:     I have  personally reviewed and noted the following in the patient's chart:   Medical and social history Use of alcohol, tobacco or illicit drugs  Current medications and supplements including opioid prescriptions. Patient is not currently taking opioid prescriptions. Functional ability and status Nutritional status Physical activity Advanced directives List of other physicians Hospitalizations, surgeries, and ER visits in previous 12 months Vitals Screenings to include cognitive, depression, and falls Referrals and appointments  In addition, I have reviewed and discussed with patient certain preventive protocols, quality metrics, and best practice recommendations. A written personalized care plan for preventive services as well as general preventive health recommendations were provided to patient.     Hal Hope, LPN   [id number]   After Visit Summary: (MyChart) Due to this being a telephonic visit, the after visit summary with patients personalized plan was offered to patient via MyChart   Notes: Nothing significant to report at this time.

## 2023-04-12 NOTE — Patient Instructions (Addendum)
 Deborah Blackwell , Thank you for taking time to come for your Medicare Wellness Visit. I appreciate your ongoing commitment to your health goals. Please review the following plan we discussed and let me know if I can assist you in the future.   Referrals/Orders/Follow-Ups/Clinician Recommendations: needs mammogram & bone density scan  This is a list of the screening recommended for you and due dates:  Health Maintenance  Topic Date Due   Pap with HPV screening  Never done   Zoster (Shingles) Vaccine (1 of 2) Never done   Eye exam for diabetics  10/03/2018   Complete foot exam   04/13/2021   DEXA scan (bone density measurement)  05/25/2022   COVID-19 Vaccine (4 - 2024-25 season) 09/17/2022   Mammogram  11/24/2022   Hemoglobin A1C  04/15/2023   Pneumococcal Vaccination (3 of 3 - PPSV23 or PCV20) 07/19/2023   Yearly kidney function blood test for diabetes  10/16/2023   Yearly kidney health urinalysis for diabetes  10/16/2023   Medicare Annual Wellness Visit  04/11/2024   Colon Cancer Screening  12/08/2024   Flu Shot  Completed   Hepatitis C Screening  Completed   HIV Screening  Completed   HPV Vaccine  Aged Out   DTaP/Tdap/Td vaccine  Discontinued    Advanced directives: (ACP Link)Information on Advanced Care Planning can be found at [facility] Advance Health Care Directives Advance Health Care Directives. [email or web address]   Next Medicare Annual Wellness Visit scheduled for next year: Yes   04/17/24 @ 8:10 am by phone

## 2023-04-18 ENCOUNTER — Encounter: Payer: Self-pay | Admitting: Family Medicine

## 2023-04-18 ENCOUNTER — Ambulatory Visit: Payer: Medicare Other | Admitting: Family Medicine

## 2023-04-18 VITALS — BP 128/68 | HR 68 | Resp 16 | Ht 61.0 in | Wt 136.0 lb

## 2023-04-18 DIAGNOSIS — J449 Chronic obstructive pulmonary disease, unspecified: Secondary | ICD-10-CM | POA: Diagnosis not present

## 2023-04-18 DIAGNOSIS — E782 Mixed hyperlipidemia: Secondary | ICD-10-CM | POA: Diagnosis not present

## 2023-04-18 DIAGNOSIS — E039 Hypothyroidism, unspecified: Secondary | ICD-10-CM | POA: Diagnosis not present

## 2023-04-18 DIAGNOSIS — E1140 Type 2 diabetes mellitus with diabetic neuropathy, unspecified: Secondary | ICD-10-CM

## 2023-04-18 DIAGNOSIS — R7303 Prediabetes: Secondary | ICD-10-CM

## 2023-04-18 MED ORDER — LEVOTHYROXINE SODIUM 50 MCG PO TABS
50.0000 ug | ORAL_TABLET | ORAL | 1 refills | Status: DC
Start: 2023-04-18 — End: 2023-10-04

## 2023-04-18 NOTE — Patient Instructions (Signed)
 Call to schedule mammogram and bone density   [facility] [address], [place], [place] [address] Scheduling phone #: [phone]  Health Maintenance  Topic Date Due   Pap with HPV screening  Never done   Zoster (Shingles) Vaccine (1 of 2) Never done   COVID-19 Vaccine (4 - 2024-25 season) 09/17/2022   Hemoglobin A1C  04/15/2023   Eye exam for diabetics  04/18/2023*   Mammogram  04/17/2024*   DEXA scan (bone density measurement)  04/17/2024*   Pneumococcal Vaccination (3 of 3 - PPSV23 or PCV20) 07/19/2023   Flu Shot  08/17/2023   Yearly kidney function blood test for diabetes  10/16/2023   Yearly kidney health urinalysis for diabetes  10/16/2023   Medicare Annual Wellness Visit  04/11/2024   Complete foot exam   04/17/2024   Colon Cancer Screening  12/08/2024   Hepatitis C Screening  Completed   HIV Screening  Completed   HPV Vaccine  Aged Out   DTaP/Tdap/Td vaccine  Discontinued  *Topic was postponed. The date shown is not the original due date.

## 2023-04-18 NOTE — Progress Notes (Signed)
 Name: Deborah Blackwell   MRN: [id number]    DOB: 03-Dec-1960   Date:04/18/2023       Progress Note  Chief Complaint  Patient presents with   Medical Management of Chronic Issues     Subjective:   Deborah Blackwell is a 63 y.o. female, presents to clinic for routine follow up on chronic conditions   Last labs prediabetes and prior to that was normal Lab Results  Component Value Date   HGBA1C 5.7 (H) 10/16/2022   HGBA1C 5.5 02/02/2022   HGBA1C 5.5 04/19/2021   HGBA1C 5.5 04/13/2020   HGBA1C 5.3 06/17/2019   On fosamax  for osteoporosis - no SE or concerns at this time  On celexa and meds from psych  On crestor  for HLD, dose 40 mg Lab Results  Component Value Date   CHOL 203 (H) 10/16/2022   HDL 40 (L) 10/16/2022   LDLCALC 136 (H) 10/16/2022   TRIG 144 10/16/2022   CHOLHDL 5.1 (H) 10/16/2022   Using trelegy, SABA and allergy meds, no recent exacerbations    Current Outpatient Medications:    albuterol  (VENTOLIN  HFA) 108 (90 Base) MCG/ACT inhaler, INHALE 2 PUFFS INTO THE LUNGS EVERY 4 HOURS AS NEEDED FOR WHEEZING OR SHORTNESS OF BREATH., Disp: 18 each, Rfl: 0   alendronate  (FOSAMAX ) 70 MG tablet, TAKE 1 TABLET BY MOUTH EVERY 7 (SEVEN) DAYS. TAKE WITH A FULL GLASS OF WATER ON AN EMPTY STOMACH., Disp: 12 tablet, Rfl: 3   ALPRAZolam  (XANAX ) 1 MG tablet, Take 1 tablet by mouth 2 (two) times daily., Disp: , Rfl: 2   Calcium  Carb-Cholecalciferol 600-10 MG-MCG TABS, Take by mouth., Disp: , Rfl:    citalopram (CELEXA) 10 MG tablet, Take 30 mg by mouth daily., Disp: , Rfl:    Fluticasone -Umeclidin-Vilant (TRELEGY ELLIPTA ) 200-62.5-25 MCG/ACT AEPB, Inhale 1 puff into the lungs daily. Consistent time of day for COPD, Disp: 28 each, Rfl: 3   levocetirizine (XYZAL ) 5 MG tablet, TAKE 1 TABLET BY MOUTH EVERY DAY IN THE EVENING, Disp: 90 tablet, Rfl: 0   levothyroxine  (SYNTHROID ) 50 MCG tablet, Take 1 tablet (50 mcg total) by mouth as directed. TAKE 1 TABLET (50 MCG) BEFORE BREAKFAST X 5 DAYS A  WEEK AND TAKE 1.5 TABLETS (75 MCG) BEFORE BREAKFAST 2X A WEEK, Disp: 103 tablet, Rfl: 0   nystatin  powder, Apply topically to rash two to three times a day, Disp: 30 g, Rfl: 1   rosuvastatin  (CRESTOR ) 40 MG tablet, Take 1 tablet (40 mg total) by mouth daily. For cholesterol, Disp: 90 tablet, Rfl: 3   triamcinolone  ointment (KENALOG ) 0.1 %, Apply 1 application topically 2 (two) times daily as needed., Disp: 30 g, Rfl: 2   valsartan  (DIOVAN ) 80 MG tablet, TAKE 1 TABLET (80 MG TOTAL) BY MOUTH DAILY. FOR BLOOD PRESSURE, Disp: 90 tablet, Rfl: 3   vitamin B-12 (CYANOCOBALAMIN ) 1000 MCG tablet, Take 1,000 mcg by mouth daily., Disp: , Rfl:    oxybutynin  (DITROPAN -XL) 10 MG 24 hr tablet, Take 1 tablet (10 mg total) by mouth daily. (Patient not taking: Reported on 04/18/2023), Disp: 90 tablet, Rfl: 3   traZODone  (DESYREL ) 100 MG tablet, Take 50-100 mg by mouth at bedtime. (Patient not taking: Reported on 04/18/2023), Disp: , Rfl:   Patient Active Problem List   Diagnosis Date Noted   Subacute cough 10/19/2022   Prediabetes 10/16/2022   Urinary incontinence 10/03/2021   Osteoporosis 05/27/2020   Polyp of transverse colon    Unspecified inflammatory spondylopathy, 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HCC)  06/17/2019   Major depression, recurrent (HCC) 01/05/2017   Abnormal grief reaction 01/05/2017   Chronic pain syndrome 03/07/2016   Vitamin D  deficiency 11/10/2015   Arthralgia 10/20/2015   Lumbar facet hypertrophy (Bilateral) 10/20/2015   Lumbar facet syndrome (Location of Primary Source of Pain) (Right) 10/20/2015   Lumbar spondylosis 10/20/2015   Chronic shoulder pain (Right) 10/18/2015   Chronic low back pain (Location of Primary Source of Pain) (Right) 10/18/2015   Chronic lower extremity pain (Location of Secondary source of pain) (Right) 10/18/2015   Occipital neuralgia (Right) 10/18/2015   Cervicogenic headache 10/18/2015   Type 2 diabetes, controlled, 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   Chronic neck pain (Location  of Tertiary source of pain) (Right) 09/30/2014   Cervical disc disorder with radiculopathy of cervical region 09/30/2014   Gout 08/07/2014   Hyperlipidemia 08/07/2014   Hypothyroidism 08/07/2014   Congenital scoliosis 08/07/2014   COPD (chronic obstructive pulmonary disease) (HCC)    Hypertension     Past Surgical History:  Procedure Laterality Date   ABDOMINAL HYSTERECTOMY  2003   one ovary remains: due to heavy bleeding/endometrioma/cysts   BACK SURGERY     and injections   CARPAL TUNNEL RELEASE     COLONOSCOPY WITH PROPOFOL  N/A 12/09/2019   Procedure: COLONOSCOPY WITH PROPOFOL ;  Surgeon: Marnee Sink, MD;  Location: [facility] ENDOSCOPY;  Service: Endoscopy;  Laterality: N/A;   FOOT SURGERY Right    bone spur   POLYPECTOMY     uterine    Family History  Problem Relation Age of Onset   Alzheimer's disease Mother    Dementia Mother    Cancer Mother        colon   Heart disease Mother    Hypertension Mother    Depression Mother    Alzheimer's disease Father    Dementia Father    Hypertension Father    Cancer Brother        lung   Lung disease Brother    Hypertension Sister    Pneumonia Brother    Heart failure Brother    Breast cancer Neg Hx     Social History   Tobacco Use   Smoking status: Former    Current packs/day: 0.00    Average packs/day: 2.0 packs/day for 5.0 years (10.0 ttl pk-yrs)    Types: Cigarettes    Start date: 02/01/1986    Quit date: 02/02/1991    Years since quitting: 32.2    Passive exposure: Past   Smokeless tobacco: Never  Vaping Use   Vaping status: Never Used  Substance Use Topics   Alcohol use: No    Alcohol/week: 0.0 standard drinks of alcohol   Drug use: No     Allergies  Allergen Reactions   Contrast Media [Iodinated Contrast Media] Shortness Of Breath   Aspirin Nausea And Vomiting   Macrobid  [Nitrofurantoin  Monohyd Macro] Hives and Nausea Only    Health Maintenance  Topic Date Due   Cervical Cancer Screening (HPV/Pap  Cotest)  Never done   Zoster Vaccines- Shingrix (1 of 2) Never done   FOOT EXAM  04/13/2021   COVID-19 Vaccine (4 - 2024-25 season) 09/17/2022   HEMOGLOBIN A1C  04/15/2023   OPHTHALMOLOGY EXAM  04/18/2023 (Originally 10/03/2018)   MAMMOGRAM  04/17/2024 (Originally 11/24/2022)   DEXA SCAN  04/17/2024 (Originally 05/25/2022)   Pneumococcal Vaccine 77-29 Years old (3 of 3 - PPSV23 or PCV20) 07/19/2023   INFLUENZA VACCINE  08/17/2023   Diabetic kidney evaluation - eGFR measurement  10/16/2023  Diabetic kidney evaluation - Urine ACR  10/16/2023   Medicare Annual Wellness (AWV)  04/11/2024   Colonoscopy  12/08/2024   Hepatitis C Screening  Completed   HIV Screening  Completed   HPV VACCINES  Aged Out   DTaP/Tdap/Td  Discontinued    Chart Review Today: I personally reviewed active problem list, medication list, allergies, family history, social history, health maintenance, notes from last encounter, lab results, imaging with the patient/caregiver today.   Review of Systems  Constitutional: Negative.   HENT: Negative.    Eyes: Negative.   Respiratory: Negative.    Cardiovascular: Negative.   Gastrointestinal: Negative.   Endocrine: Negative.   Genitourinary: Negative.   Musculoskeletal: Negative.   Skin: Negative.   Allergic/Immunologic: Negative.   Neurological: Negative.   Hematological: Negative.   Psychiatric/Behavioral: Negative.    All other systems reviewed and are negative.    Objective:   Vitals:   04/18/23 1358  BP: 128/68  Pulse: 68  Resp: 16  SpO2: 99%  Weight: 136 lb (61.7 kg)  Height: 5\' 1"  (1.549 m)    Body mass index is 25.7 kg/m.  Physical Exam Vitals and nursing note reviewed.  Constitutional:      General: She is not in acute distress.    Appearance: Normal appearance. She is well-developed. She is not ill-appearing, toxic-appearing or diaphoretic.  HENT:     Head: Normocephalic and atraumatic.     Right Ear: External ear normal.     Left Ear:  External ear normal.     Nose: Nose normal.  Eyes:     General:        Right eye: No discharge.        Left eye: No discharge.     Conjunctiva/sclera: Conjunctivae normal.  Neck:     Trachea: No tracheal deviation.  Cardiovascular:     Rate and Rhythm: Normal rate and regular rhythm.  Pulmonary:     Effort: Pulmonary effort is normal. No respiratory distress.     Breath sounds: Normal breath sounds. No stridor. No wheezing, rhonchi or rales.  Skin:    General: Skin is warm and dry.     Findings: No rash.  Neurological:     Mental Status: She is alert.     Motor: No abnormal muscle tone.     Coordination: Coordination normal.  Psychiatric:        Behavior: Behavior normal.     Diabetic Foot Exam - Simple   Simple Foot Form Diabetic Foot exam was performed with the following findings: Yes 04/18/2023  2:23 PM  Visual Inspection No deformities, no ulcerations, no other skin breakdown bilaterally: Yes Sensation Testing Intact to touch and monofilament testing bilaterally: Yes Pulse Check Posterior Tibialis and Dorsalis pulse intact bilaterally: Yes Comments      Results for orders placed or performed in visit on 10/19/22  POC COVID-19   Collection Time: 10/19/22  2:49 PM  Result Value Ref Range   SARS Coronavirus 2 Ag Negative Negative  POCT rapid strep A   Collection Time: 10/19/22  2:49 PM  Result Value Ref Range   Rapid Strep A Screen Negative Negative      Assessment & Plan:     ICD-10-CM   1. Type 2 diabetes, controlled, with neuropathy (HCC)  E11.40 HM Diabetes Foot Exam   hx of DM, but in normal range and then prediabetic range with the past several years, DM foot exam done today, she will do labs next OV  2. Hypothyroidism, unspecified type  E03.9 levothyroxine  (SYNTHROID ) 50 MCG tablet   euthyroid on current meds, last next OV, continue same meds right now, refills ordered    3. Mixed hyperlipidemia  E78.2    on crestor  40, tolerating, will do labs  next OV    4. Chronic obstructive pulmonary disease, unspecified COPD type (HCC)  J44.9    stable on current maintenence inhaler and using alguterol PRN and managing allergies    5. Prediabetes  R73.03    see #1        Return for sept Annual Physical.   Adeline Hone, PA-C 04/18/23 2:17 PM

## 2023-04-20 ENCOUNTER — Telehealth: Payer: Self-pay

## 2023-04-20 DIAGNOSIS — H40003 Preglaucoma, unspecified, bilateral: Secondary | ICD-10-CM | POA: Diagnosis not present

## 2023-04-20 DIAGNOSIS — H2513 Age-related nuclear cataract, bilateral: Secondary | ICD-10-CM | POA: Diagnosis not present

## 2023-04-20 DIAGNOSIS — M3501 Sicca syndrome with keratoconjunctivitis: Secondary | ICD-10-CM | POA: Diagnosis not present

## 2023-04-20 LAB — HM DIABETES EYE EXAM

## 2023-04-20 NOTE — Telephone Encounter (Signed)
 Copied from CRM [id number]. Topic: General - Other >> Apr 20, 2023  8:08 AM Arley Phenix D wrote: Reason for CRM: Patient stated that she is has a really bad cough and is congested. Patient stated that she has been taking mucinex but its not working for her. Patient wants to know id Dr.Tapia can prescribed her a stronger medication to take for the cough and congestion.

## 2023-04-21 DIAGNOSIS — R0981 Nasal congestion: Secondary | ICD-10-CM | POA: Diagnosis not present

## 2023-04-21 DIAGNOSIS — R051 Acute cough: Secondary | ICD-10-CM | POA: Diagnosis not present

## 2023-04-21 DIAGNOSIS — J069 Acute upper respiratory infection, unspecified: Secondary | ICD-10-CM | POA: Diagnosis not present

## 2023-04-26 NOTE — Telephone Encounter (Signed)
 Pt went to [facility] on Saturday

## 2023-05-07 ENCOUNTER — Encounter: Payer: Self-pay | Admitting: Ophthalmology

## 2023-05-07 DIAGNOSIS — H2511 Age-related nuclear cataract, right eye: Secondary | ICD-10-CM | POA: Diagnosis not present

## 2023-05-08 ENCOUNTER — Encounter: Payer: Self-pay | Admitting: Ophthalmology

## 2023-05-08 NOTE — Anesthesia Preprocedure Evaluation (Addendum)
 Anesthesia Evaluation  Patient identified by MRN, date of birth, ID band Patient awake    Reviewed: Allergy & Precautions, H&P , NPO status , Patient's Chart, lab work & pertinent test results  Airway Mallampati: III  TM Distance: <3 FB Neck ROM: Full    Dental no notable dental hx. (+) Partial Lower   Pulmonary neg pulmonary ROS, asthma , COPD, former smoker   Pulmonary exam normal breath sounds clear to auscultation       Cardiovascular hypertension, + angina  negative cardio ROS Normal cardiovascular exam Rhythm:Regular Rate:Normal  Cannot find echo nor cardiac note on Epic   Neuro/Psych  Headaches PSYCHIATRIC DISORDERS Anxiety Depression Bipolar Disorder    Neuromuscular disease negative neurological ROS  negative psych ROS   GI/Hepatic negative GI ROS, Neg liver ROS,GERD  ,,  Endo/Other  negative endocrine ROSdiabetesHypothyroidism    Renal/GU negative Renal ROS  negative genitourinary   Musculoskeletal negative musculoskeletal ROS (+) Arthritis ,    Abdominal   Peds negative pediatric ROS (+)  Hematology negative hematology ROS (+)   Anesthesia Other Findings Angina at rest [facility]) Arthritis  Asthma GERD (gastroesophageal reflux disease) COPD (chronic obstructive pulmonary disease) (HCC) Hypertension  Hyperlipidemia Gout  RA (rheumatoid arthritis) (HCC) History of kidney stones  Migraines Depression Panic attack Bipolar disorder (HCC)  Congenital scoliosis DDD (degenerative disc disease), lumb Lumbar radiculopathy Back pain with right-sided sciatica History of diabetes mellitus Type 2 diabetes, controlled, with neuropathy (HCC)  Elevated C-reactive protein (CRP) Anxiety disorder with panic attacks     Reproductive/Obstetrics negative OB ROS                             Anesthesia Physical Anesthesia Plan  ASA: 3  Anesthesia Plan: MAC   Post-op Pain Management:     Induction: Intravenous  PONV Risk Score and Plan:   Airway Management Planned: Natural Airway and Nasal Cannula  Additional Equipment:   Intra-op Plan:   Post-operative Plan:   Informed Consent: I have reviewed the patients History and Physical, chart, labs and discussed the procedure including the risks, benefits and alternatives for the proposed anesthesia with the patient or authorized representative who has indicated his/her understanding and acceptance.     Dental Advisory Given  Plan Discussed with: Anesthesiologist, CRNA and Surgeon  Anesthesia Plan Comments: (Patient consented for risks of anesthesia including but not limited to:  - adverse reactions to medications - damage to eyes, teeth, lips or other oral mucosa - nerve damage due to positioning  - sore throat or hoarseness - Damage to heart, brain, nerves, lungs, other parts of body or loss of life  Patient voiced understanding and assent.)        Anesthesia Quick Evaluation

## 2023-05-09 NOTE — Discharge Instructions (Signed)

## 2023-05-10 ENCOUNTER — Encounter: Payer: Self-pay | Admitting: Family Medicine

## 2023-05-15 ENCOUNTER — Ambulatory Visit: Admitting: Anesthesiology

## 2023-05-15 ENCOUNTER — Encounter
Admission: RE | Disposition: A | Discharge status: Home / Self Care | Payer: Self-pay | Source: Home / Self Care | Attending: Ophthalmology

## 2023-05-15 ENCOUNTER — Encounter: Payer: Self-pay | Admitting: Ophthalmology

## 2023-05-15 ENCOUNTER — Ambulatory Visit
Admission: RE | Admit: 2023-05-15 | Discharge: 2023-05-15 | Disposition: A | Discharge status: Home / Self Care | Attending: Ophthalmology | Admitting: Ophthalmology

## 2023-05-15 ENCOUNTER — Other Ambulatory Visit: Payer: Self-pay

## 2023-05-15 DIAGNOSIS — E1136 Type 2 diabetes mellitus with diabetic cataract: Secondary | ICD-10-CM | POA: Insufficient documentation

## 2023-05-15 DIAGNOSIS — Z87891 Personal history of nicotine dependence: Secondary | ICD-10-CM | POA: Insufficient documentation

## 2023-05-15 DIAGNOSIS — I10 Essential (primary) hypertension: Secondary | ICD-10-CM | POA: Insufficient documentation

## 2023-05-15 DIAGNOSIS — F319 Bipolar disorder, unspecified: Secondary | ICD-10-CM | POA: Diagnosis not present

## 2023-05-15 DIAGNOSIS — H2511 Age-related nuclear cataract, right eye: Secondary | ICD-10-CM | POA: Insufficient documentation

## 2023-05-15 DIAGNOSIS — E1140 Type 2 diabetes mellitus with diabetic neuropathy, unspecified: Secondary | ICD-10-CM | POA: Insufficient documentation

## 2023-05-15 DIAGNOSIS — J4489 Other specified chronic obstructive pulmonary disease: Secondary | ICD-10-CM | POA: Diagnosis not present

## 2023-05-15 DIAGNOSIS — E039 Hypothyroidism, unspecified: Secondary | ICD-10-CM | POA: Insufficient documentation

## 2023-05-15 HISTORY — DX: Anxiety disorder, unspecified: F41.9

## 2023-05-15 HISTORY — PX: CATARACT EXTRACTION W/PHACO: SHX586

## 2023-05-15 SURGERY — PHACOEMULSIFICATION, CATARACT, WITH IOL INSERTION
Anesthesia: Monitor Anesthesia Care | Site: Eye | Laterality: Right

## 2023-05-15 MED ORDER — ARMC OPHTHALMIC DILATING DROPS
1.0000 | OPHTHALMIC | Status: DC | PRN
  Administered 2023-05-15 (×3): 1 via OPHTHALMIC

## 2023-05-15 MED ORDER — MIDAZOLAM HCL 2 MG/2ML IJ SOLN
INTRAMUSCULAR | Status: AC
  Filled 2023-05-15: qty 2

## 2023-05-15 MED ORDER — SIGHTPATH DOSE#1 BSS IO SOLN
INTRAOCULAR | Status: DC | PRN
  Administered 2023-05-15: 15 mL via INTRAOCULAR

## 2023-05-15 MED ORDER — SODIUM CHLORIDE 0.9% FLUSH
INTRAVENOUS | Status: DC | PRN
  Administered 2023-05-15 (×4): 10 mL via INTRAVENOUS

## 2023-05-15 MED ORDER — MIDAZOLAM HCL 2 MG/2ML IJ SOLN
INTRAMUSCULAR | Status: DC | PRN
  Administered 2023-05-15 (×2): 1 mg via INTRAVENOUS

## 2023-05-15 MED ORDER — FENTANYL CITRATE (PF) 100 MCG/2ML IJ SOLN
INTRAMUSCULAR | Status: AC
  Filled 2023-05-15: qty 2

## 2023-05-15 MED ORDER — TETRACAINE HCL 0.5 % OP SOLN
1.0000 [drp] | OPHTHALMIC | Status: DC | PRN
  Administered 2023-05-15 (×3): 1 [drp] via OPHTHALMIC

## 2023-05-15 MED ORDER — LIDOCAINE HCL (PF) 2 % IJ SOLN
INTRAOCULAR | Status: DC | PRN
  Administered 2023-05-15: 2 mL

## 2023-05-15 MED ORDER — SIGHTPATH DOSE#1 NA CHONDROIT SULF-NA HYALURON 40-17 MG/ML IO SOLN
INTRAOCULAR | Status: DC | PRN
  Administered 2023-05-15: 1 mL via INTRAOCULAR

## 2023-05-15 MED ORDER — SIGHTPATH DOSE#1 BSS IO SOLN
INTRAOCULAR | Status: DC | PRN
  Administered 2023-05-15: 41 mL via OPHTHALMIC

## 2023-05-15 MED ORDER — BRIMONIDINE TARTRATE-TIMOLOL 0.2-0.5 % OP SOLN
OPHTHALMIC | Status: DC | PRN
  Administered 2023-05-15: 1 [drp] via OPHTHALMIC

## 2023-05-15 MED ORDER — FENTANYL CITRATE (PF) 100 MCG/2ML IJ SOLN
INTRAMUSCULAR | Status: DC | PRN
  Administered 2023-05-15 (×2): 50 ug via INTRAVENOUS

## 2023-05-15 SURGICAL SUPPLY — 12 items
CATARACT SUITE SIGHTPATH (MISCELLANEOUS) ×1 IMPLANT
CYSTOTOME ANGL RVRS SHRT 25G (CUTTER) ×1 IMPLANT
CYSTOTOME ANGL RVRS SHRT 25GA (CUTTER) ×1 IMPLANT
FEE CATARACT SUITE SIGHTPATH (MISCELLANEOUS) ×1 IMPLANT
GLOVE BIOGEL PI IND STRL 8 (GLOVE) ×1 IMPLANT
GLOVE SURG LX STRL 8.0 MICRO (GLOVE) ×1 IMPLANT
GLOVE SURG PROTEXIS BL SZ6.5 (GLOVE) ×1 IMPLANT
GLOVE SURG SYN 6.5 PF PI BL (GLOVE) ×1 IMPLANT
LENS IOL TECNIS EYHANCE 24.0 (Intraocular Lens) IMPLANT
NDL FILTER BLUNT 18X1 1/2 (NEEDLE) ×1 IMPLANT
NEEDLE FILTER BLUNT 18X1 1/2 (NEEDLE) ×1 IMPLANT
SYR 3ML LL SCALE MARK (SYRINGE) ×1 IMPLANT

## 2023-05-15 NOTE — Anesthesia Postprocedure Evaluation (Signed)
 Anesthesia Post Note  Patient: Deborah Blackwell  Procedure(s) Performed: PHACOEMULSIFICATION, CATARACT, WITH IOL INSERTION 3.97 00:22.3 (Right: Eye)  Patient location during evaluation: PACU Anesthesia Type: MAC Level of consciousness: awake and alert Pain management: pain level controlled Vital Signs Assessment: post-procedure vital signs reviewed and stable Respiratory status: spontaneous breathing, nonlabored ventilation, respiratory function stable and patient connected to nasal cannula oxygen Cardiovascular status: stable and blood pressure returned to baseline Postop Assessment: no apparent nausea or vomiting Anesthetic complications: no   No notable events documented.   Last Vitals:  Vitals:   05/15/23 1224 05/15/23 1228  BP: 112/61 107/65  Pulse: 64 73  Resp: 18 13  Temp: 36.5 C 36.5 C  SpO2: 96% 95%    Last Pain:  Vitals:   05/15/23 1228  TempSrc:   PainSc: 0-No pain                 Jennell Janosik C Kahlen Morais

## 2023-05-15 NOTE — Transfer of Care (Signed)
 Immediate Anesthesia Transfer of Care Note  Patient: Deborah Blackwell  Procedure(s) Performed: PHACOEMULSIFICATION, CATARACT, WITH IOL INSERTION 3.97 00:22.3 (Right: Eye)  Patient Location: PACU  Anesthesia Type:MAC  Level of Consciousness: awake, alert , and oriented  Airway & Oxygen Therapy: Patient Spontanous Breathing  Post-op Assessment: Report given to RN and Post -op Vital signs reviewed and stable  Post vital signs: Reviewed and stable  Last Vitals:  Vitals Value Taken Time  BP 112/61 05/15/23 1224  Temp 36.5 C 05/15/23 1224  Pulse 60 05/15/23 1224  Resp 20 05/15/23 1224  SpO2 96 % 05/15/23 1224  Vitals shown include unfiled device data.  Last Pain:  Vitals:   05/15/23 1224  TempSrc:   PainSc: 0-No pain         Complications: No notable events documented.

## 2023-05-15 NOTE — H&P (Signed)
 [facility]   Primary Care Physician:  Adeline Hone, PA-C Ophthalmologist: Dr.Poriflio  Pre-Procedure History & Physical: HPI:  Deborah Blackwell is a 63 y.o. female here for cataract surgery.   Past Medical History:  Diagnosis Date   Angina at rest [facility])    Anxiety disorder with panic attacks    Arthritis    Asthma    Back pain with right-sided sciatica 09/30/2014   Bipolar disorder (HCC)    Congenital scoliosis    COPD (chronic obstructive pulmonary disease) (HCC)    DDD (degenerative disc disease), lumbar    Depression    Elevated C-reactive protein (CRP) 11/10/2015   GERD (gastroesophageal reflux disease)    Gout    History of diabetes mellitus    previously on medication, removed once pt lost weight, A1c WNL > 2 yrs   History of kidney stones    History of scoliosis    was casted as a child   Hyperlipidemia    Hypertension    Lumbar radiculopathy 09/30/2014   Migraines    Panic attack    RA (rheumatoid arthritis) (HCC)    Type 2 diabetes, controlled, 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   No meds since weight loss    Past Surgical History:  Procedure Laterality Date   ABDOMINAL HYSTERECTOMY  2003   one ovary remains: due to heavy bleeding/endometrioma/cysts   BACK SURGERY     and injections   CARPAL TUNNEL RELEASE     COLONOSCOPY WITH PROPOFOL  N/A 12/09/2019   Procedure: COLONOSCOPY WITH PROPOFOL ;  Surgeon: Marnee Sink, MD;  Location: [facility] ENDOSCOPY;  Service: Endoscopy;  Laterality: N/A;   FOOT SURGERY Right    bone spur   POLYPECTOMY     uterine    Prior to Admission medications   Medication Sig Start Date End Date Taking? Authorizing Provider  albuterol  (VENTOLIN  HFA) 108 (90 Base) MCG/ACT inhaler INHALE 2 PUFFS INTO THE LUNGS EVERY 4 HOURS AS NEEDED FOR WHEEZING OR SHORTNESS OF BREATH. 11/13/22  Yes Tapia, Leisa, PA-C  alendronate  (FOSAMAX ) 70 MG tablet TAKE 1 TABLET BY MOUTH EVERY 7 (SEVEN) DAYS. TAKE WITH A FULL GLASS OF WATER ON AN EMPTY STOMACH.  10/16/22  Yes Mecum, Erin E, PA-C  ALPRAZolam  (XANAX ) 1 MG tablet Take 1 tablet by mouth 2 (two) times daily. 09/11/17  Yes [provider]  ascorbic acid (VITAMIN C) 500 MG tablet Take 500 mg by mouth daily.   Yes [provider]  Biotin 1 MG CAPS Take by mouth daily.   Yes [provider]  Calcium  Carb-Cholecalciferol 600-10 MG-MCG TABS Take by mouth.   Yes [provider]  citalopram (CELEXA) 10 MG tablet Take 30 mg by mouth daily. 03/08/21  Yes [provider]  Fluticasone -Umeclidin-Vilant (TRELEGY ELLIPTA ) 200-62.5-25 MCG/ACT AEPB Inhale 1 puff into the lungs daily. Consistent time of day for COPD 10/16/22  Yes Mecum, Erin E, PA-C  levocetirizine (XYZAL ) 5 MG tablet TAKE 1 TABLET BY MOUTH EVERY DAY IN THE EVENING 11/20/22  Yes Tapia, Leisa, PA-C  levothyroxine  (SYNTHROID ) 50 MCG tablet Take 1 tablet (50 mcg total) by mouth as directed. TAKE 1 TABLET (50 MCG) BEFORE BREAKFAST X 5 DAYS A WEEK AND TAKE 1.5 TABLETS (75 MCG) BEFORE BREAKFAST 2X A WEEK 04/18/23  Yes Tapia, Leisa, PA-C  rosuvastatin  (CRESTOR ) 40 MG tablet Take 1 tablet (40 mg total) by mouth daily. For cholesterol 10/16/22  Yes Mecum, Erin E, PA-C  valsartan  (DIOVAN ) 80 MG tablet TAKE 1 TABLET (80 MG  TOTAL) BY MOUTH DAILY. FOR BLOOD PRESSURE 10/05/22  Yes Tapia, Leisa, PA-C  vitamin B-12 (CYANOCOBALAMIN ) 1000 MCG tablet Take 1,000 mcg by mouth daily.   Yes [provider]  vitamin D3 (CHOLECALCIFEROL) 25 MCG tablet Take 1,000 Units by mouth daily.   Yes [provider]  vitamin E 180 MG (400 UNITS) capsule Take 400 Units by mouth daily.   Yes [provider]  nystatin  powder Apply topically to rash two to three times a day 02/02/22   Pender, Julie F, FNP  triamcinolone  ointment (KENALOG ) 0.1 % Apply 1 application topically 2 (two) times daily as needed. 07/29/20   Tapia, Leisa, PA-C    Allergies as of 04/25/2023 - Review Complete 04/18/2023  Allergen Reaction Noted    Contrast media [iodinated contrast media] Shortness Of Breath 07/06/2014   Aspirin Nausea And Vomiting 07/06/2014   Macrobid  [nitrofurantoin  monohyd macro] Hives and Nausea Only 03/01/2015    Family History  Problem Relation Age of Onset   Alzheimer's disease Mother    Dementia Mother    Cancer Mother        colon   Heart disease Mother    Hypertension Mother    Depression Mother    Alzheimer's disease Father    Dementia Father    Hypertension Father    Cancer Brother        lung   Lung disease Brother    Hypertension Sister    Pneumonia Brother    Heart failure Brother    Breast cancer Neg Hx     Social History   Socioeconomic History   Marital status: Widowed    Spouse name: Not on file   Number of children: 0   Years of education: Not on file   Highest education level: Associate degree: academic program  Occupational History   Not on file  Tobacco Use   Smoking status: Former    Current packs/day: 0.00    Average packs/day: 2.0 packs/day for 5.0 years (10.0 ttl pk-yrs)    Types: Cigarettes    Start date: 02/01/1986    Quit date: 02/02/1991    Years since quitting: 32.3    Passive exposure: Past   Smokeless tobacco: Never  Vaping Use   Vaping status: Never Used  Substance and Sexual Activity   Alcohol use: No    Alcohol/week: 0.0 standard drinks of alcohol   Drug use: No   Sexual activity: Not Currently  Other Topics Concern   Not on file  Social History Narrative   Lives alone.      Volunteers and [employer], [employer] food pant[employer] & Hospice   Social Drivers of Health   Financial Resource Strain: Low Risk  (04/12/2023)   Overall Financial Resource Strain (CARDIA)    Difficulty of Paying Living Expenses: Not hard at all  Food Insecurity: No Food Insecurity (04/12/2023)   Hunger Vital Sign    Worried About Running Out of Food in the Last Year: Never true    Ran Out of Food in the Last Year: Never true  Transportation Needs: No  Transportation Needs (04/12/2023)   PRAPARE - Administrator, Civil Service (Medical): No    Lack of Transportation (Non-Medical): No  Physical Activity: Sufficiently Active (04/12/2023)   Exercise Vital Sign    Days of Exercise per Week: 7 days    Minutes of Exercise per Session: 30 min  Stress: No Stress Concern Present (04/12/2023)   [employer] of Occupational Health -  Occupational Stress Questionnaire    Feeling of Stress : Only a little  Social Connections: Moderately Integrated (04/12/2023)   Social Connection and Isolation Panel [NHANES]    Frequency of Communication with Friends and Family: More than three times a week    Frequency of Social Gatherings with Friends and Family: Once a week    Attends Religious Services: More than 4 times per year    Active Member of [employer] or Organizations: Yes    Attends Banker Meetings: More than 4 times per year    Marital Status: Widowed  Intimate Partner Violence: Not At Risk (04/12/2023)   Humiliation, Afraid, Rape, and Kick questionnaire    Fear of Current or Ex-Partner: No    Emotionally Abused: No    Physically Abused: No    Sexually Abused: No    Review of Systems: See HPI, otherwise negative ROS  Physical Exam: BP 107/76   Pulse 72   Temp 97.9 F (36.6 C) (Temporal)   Ht 5\' 1"  (1.549 m)   Wt 61.1 kg   SpO2 96%   BMI 25.45 kg/m  General:   Alert, cooperative. Head:  Normocephalic and atraumatic. Respiratory:  Normal work of breathing. Cardiovascular:  NAD  Impression/Plan: Deborah Blackwell is here for cataract surgery.  Risks, benefits, limitations, and alternatives regarding cataract surgery have been reviewed with the patient.  Questions have been answered.  All parties agreeable.   Clair Crews, MD  05/15/2023, 12:01 PM

## 2023-05-15 NOTE — Op Note (Signed)
 PREOPERATIVE DIAGNOSIS:  Nuclear sclerotic cataract of the right eye.   POSTOPERATIVE DIAGNOSIS:  Right Eye Cataract   OPERATIVE PROCEDURE:ORPROCALL@   SURGEON:  Clair Crews, MD.   ANESTHESIA:  Anesthesiologist: Emilie Harden, MD CRNA: Karan Osgood, CRNA  1.      Managed anesthesia care. 2.      0.45ml of Shugarcaine was instilled in the eye following the paracentesis.   COMPLICATIONS:  None.   TECHNIQUE:   Stop and chop   DESCRIPTION OF PROCEDURE:  The patient was examined and consented in the preoperative holding area where the aforementioned topical anesthesia was applied to the right eye and then brought back to the Operating Room where the right eye was prepped and draped in the usual sterile ophthalmic fashion and a lid speculum was placed. A paracentesis was created with the side port blade and the anterior chamber was filled with viscoelastic. A near clear corneal incision was performed with the steel keratome. A continuous curvilinear capsulorrhexis was performed with a cystotome followed by the capsulorrhexis forceps. Hydrodissection and hydrodelineation were carried out with BSS on a blunt cannula. The lens was removed in a stop and chop  technique and the remaining cortical material was removed with the irrigation-aspiration handpiece. The capsular bag was inflated with viscoelastic and the Technis ZCB00  lens was placed in the capsular bag without complication. The remaining viscoelastic was removed from the eye with the irrigation-aspiration handpiece. The wounds were hydrated. The anterior chamber was flushed with BSS and the eye was inflated to physiologic pressure. 0.1ml of Vigamox was placed in the anterior chamber. The wounds were found to be water tight. The eye was dressed with Combigan. The patient was given protective glasses to wear throughout the day and a shield with which to sleep tonight. The patient was also given drops with which to begin a drop regimen  today and will follow-up with me in one day. Implant Name Type Inv. Item Serial No. Manufacturer Lot No. LRB No. Used Action  LENS IOL TECNIS EYHANCE 24.0 - [id number] Intraocular Lens LENS IOL TECNIS EYHANCE 24.0 [id number] SIGHTPATH  Right 1 Implanted   Procedure(s): PHACOEMULSIFICATION, CATARACT, WITH IOL INSERTION 3.97 00:22.3 (Right)  Electronically signed: Clair Crews 05/15/2023 12:23 PM

## 2023-05-16 DIAGNOSIS — H2512 Age-related nuclear cataract, left eye: Secondary | ICD-10-CM | POA: Diagnosis not present

## 2023-05-25 NOTE — Discharge Instructions (Signed)

## 2023-05-28 NOTE — Anesthesia Preprocedure Evaluation (Signed)
 Anesthesia Evaluation  Patient identified by MRN, date of birth, ID band Patient awake    Reviewed: Allergy & Precautions, H&P , NPO status , Patient's Chart, lab work & pertinent test results  Airway Mallampati: III  TM Distance: <3 FB Neck ROM: Full    Dental no notable dental hx.    Pulmonary neg pulmonary ROS, asthma , COPD, former smoker   Pulmonary exam normal breath sounds clear to auscultation       Cardiovascular hypertension, + angina  negative cardio ROS Normal cardiovascular exam Rhythm:Regular Rate:Normal   Cannot find echo nor cardiac note on Epic   Neuro/Psych  Headaches PSYCHIATRIC DISORDERS Anxiety Depression Bipolar Disorder    Neuromuscular disease negative neurological ROS  negative psych ROS   GI/Hepatic negative GI ROS, Neg liver ROS,GERD  ,,  Endo/Other  negative endocrine ROSdiabetesHypothyroidism    Renal/GU negative Renal ROS  negative genitourinary   Musculoskeletal negative musculoskeletal ROS (+) Arthritis ,    Abdominal   Peds negative pediatric ROS (+)  Hematology negative hematology ROS (+)   Anesthesia Other Findings Previous cataract 05-15-23   History of scoliosis  Angina at rest Arthritis  Asthma GERD (gastroesophageal reflux disease) COPD (chronic obstructive pulmonary disease) (HCC) Hypertension Hyperlipidemia Gout RA (rheumatoid arthritis) (HCC) History of kidney stones Migraines Depression Panic attack Bipolar disorder (HCC)  Congenital scoliosis DDD (degenerative disc disease), lumbar Lumbar radiculopathy Back pain with right-sided sciatica History of diabetes mellitus Type 2 diabetes, controlled, with neuropathy (HCC)  Elevated C-reactive protein (CRP) Anxiety disorder with panic attacks     Reproductive/Obstetrics negative OB ROS                             Anesthesia Physical Anesthesia Plan  ASA: 3  Anesthesia Plan: MAC    Post-op Pain Management:    Induction: Intravenous  PONV Risk Score and Plan:   Airway Management Planned: Natural Airway and Nasal Cannula  Additional Equipment:   Intra-op Plan:   Post-operative Plan:   Informed Consent: I have reviewed the patients History and Physical, chart, labs and discussed the procedure including the risks, benefits and alternatives for the proposed anesthesia with the patient or authorized representative who has indicated his/her understanding and acceptance.     Dental Advisory Given  Plan Discussed with: Anesthesiologist, CRNA and Surgeon  Anesthesia Plan Comments: (Patient consented for risks of anesthesia including but not limited to:  - adverse reactions to medications - damage to eyes, teeth, lips or other oral mucosa - nerve damage due to positioning  - sore throat or hoarseness - Damage to heart, brain, nerves, lungs, other parts of body or loss of life  Patient voiced understanding and assent.)        Anesthesia Quick Evaluation

## 2023-05-29 ENCOUNTER — Encounter: Payer: Self-pay | Admitting: Ophthalmology

## 2023-05-29 ENCOUNTER — Ambulatory Visit
Admission: RE | Admit: 2023-05-29 | Discharge: 2023-05-29 | Disposition: A | Discharge status: Home / Self Care | Attending: Ophthalmology | Admitting: Ophthalmology

## 2023-05-29 ENCOUNTER — Encounter
Admission: RE | Disposition: A | Discharge status: Home / Self Care | Payer: Self-pay | Source: Home / Self Care | Attending: Ophthalmology

## 2023-05-29 ENCOUNTER — Other Ambulatory Visit: Payer: Self-pay

## 2023-05-29 ENCOUNTER — Ambulatory Visit: Payer: Self-pay | Admitting: Anesthesiology

## 2023-05-29 DIAGNOSIS — Z87891 Personal history of nicotine dependence: Secondary | ICD-10-CM | POA: Diagnosis not present

## 2023-05-29 DIAGNOSIS — H2512 Age-related nuclear cataract, left eye: Secondary | ICD-10-CM | POA: Insufficient documentation

## 2023-05-29 DIAGNOSIS — E039 Hypothyroidism, unspecified: Secondary | ICD-10-CM | POA: Diagnosis not present

## 2023-05-29 DIAGNOSIS — F419 Anxiety disorder, unspecified: Secondary | ICD-10-CM | POA: Insufficient documentation

## 2023-05-29 DIAGNOSIS — E1136 Type 2 diabetes mellitus with diabetic cataract: Secondary | ICD-10-CM | POA: Diagnosis not present

## 2023-05-29 DIAGNOSIS — E1140 Type 2 diabetes mellitus with diabetic neuropathy, unspecified: Secondary | ICD-10-CM | POA: Diagnosis not present

## 2023-05-29 DIAGNOSIS — I209 Angina pectoris, unspecified: Secondary | ICD-10-CM | POA: Diagnosis not present

## 2023-05-29 DIAGNOSIS — J4489 Other specified chronic obstructive pulmonary disease: Secondary | ICD-10-CM | POA: Insufficient documentation

## 2023-05-29 DIAGNOSIS — I10 Essential (primary) hypertension: Secondary | ICD-10-CM | POA: Diagnosis not present

## 2023-05-29 HISTORY — PX: CATARACT EXTRACTION W/PHACO: SHX586

## 2023-05-29 SURGERY — PHACOEMULSIFICATION, CATARACT, WITH IOL INSERTION
Anesthesia: Monitor Anesthesia Care | Site: Eye | Laterality: Left

## 2023-05-29 MED ORDER — FENTANYL CITRATE (PF) 100 MCG/2ML IJ SOLN
INTRAMUSCULAR | Status: DC | PRN
  Administered 2023-05-29 (×2): 50 ug via INTRAVENOUS

## 2023-05-29 MED ORDER — MOXIFLOXACIN HCL 0.5 % OP SOLN
OPHTHALMIC | Status: DC | PRN
  Administered 2023-05-29: .2 mL via OPHTHALMIC

## 2023-05-29 MED ORDER — LIDOCAINE HCL (PF) 2 % IJ SOLN
INTRAMUSCULAR | Status: DC | PRN
  Administered 2023-05-29: 2 mL

## 2023-05-29 MED ORDER — TETRACAINE HCL 0.5 % OP SOLN
OPHTHALMIC | Status: AC
  Filled 2023-05-29: qty 4

## 2023-05-29 MED ORDER — SIGHTPATH DOSE#1 BSS IO SOLN
INTRAOCULAR | Status: DC | PRN
  Administered 2023-05-29: 15 mL via INTRAOCULAR

## 2023-05-29 MED ORDER — MIDAZOLAM HCL 2 MG/2ML IJ SOLN
INTRAMUSCULAR | Status: DC | PRN
  Administered 2023-05-29 (×2): 1 mg via INTRAVENOUS

## 2023-05-29 MED ORDER — ARMC OPHTHALMIC DILATING DROPS
1.0000 | OPHTHALMIC | Status: DC | PRN
  Administered 2023-05-29 (×3): 1 via OPHTHALMIC

## 2023-05-29 MED ORDER — BRIMONIDINE TARTRATE-TIMOLOL 0.2-0.5 % OP SOLN
OPHTHALMIC | Status: DC | PRN
Start: 2023-05-29 — End: 2023-05-29
  Administered 2023-05-29: 1 [drp] via OPHTHALMIC

## 2023-05-29 MED ORDER — ARMC OPHTHALMIC DILATING DROPS
OPHTHALMIC | Status: AC
  Filled 2023-05-29: qty 0.5

## 2023-05-29 MED ORDER — SIGHTPATH DOSE#1 BSS IO SOLN
INTRAOCULAR | Status: DC | PRN
  Administered 2023-05-29: 51 mL via OPHTHALMIC

## 2023-05-29 MED ORDER — SIGHTPATH DOSE#1 NA CHONDROIT SULF-NA HYALURON 40-17 MG/ML IO SOLN
INTRAOCULAR | Status: DC | PRN
  Administered 2023-05-29: 1 mL via INTRAOCULAR

## 2023-05-29 MED ORDER — TETRACAINE HCL 0.5 % OP SOLN
1.0000 [drp] | OPHTHALMIC | Status: DC | PRN
  Administered 2023-05-29 (×3): 1 [drp] via OPHTHALMIC

## 2023-05-29 MED ORDER — MIDAZOLAM HCL 2 MG/2ML IJ SOLN
INTRAMUSCULAR | Status: AC
  Filled 2023-05-29: qty 2

## 2023-05-29 MED ORDER — FENTANYL CITRATE (PF) 100 MCG/2ML IJ SOLN
INTRAMUSCULAR | Status: AC
  Filled 2023-05-29: qty 2

## 2023-05-29 SURGICAL SUPPLY — 12 items
CATARACT SUITE SIGHTPATH (MISCELLANEOUS) ×1 IMPLANT
CYSTOTOME ANGL RVRS SHRT 25G (CUTTER) ×1 IMPLANT
CYSTOTOME ANGL RVRS SHRT 25GA (CUTTER) ×1 IMPLANT
FEE CATARACT SUITE SIGHTPATH (MISCELLANEOUS) ×1 IMPLANT
GLOVE BIOGEL PI IND STRL 8 (GLOVE) ×1 IMPLANT
GLOVE SURG LX STRL 8.0 MICRO (GLOVE) ×1 IMPLANT
GLOVE SURG PROTEXIS BL SZ6.5 (GLOVE) ×1 IMPLANT
GLOVE SURG SYN 6.5 PF PI BL (GLOVE) ×1 IMPLANT
LENS IOL TECNIS EYHANCE 23.5 (Intraocular Lens) IMPLANT
NDL FILTER BLUNT 18X1 1/2 (NEEDLE) ×1 IMPLANT
NEEDLE FILTER BLUNT 18X1 1/2 (NEEDLE) ×1 IMPLANT
SYR 3ML LL SCALE MARK (SYRINGE) ×1 IMPLANT

## 2023-05-29 NOTE — Transfer of Care (Signed)
 Immediate Anesthesia Transfer of Care Note  Patient: Deborah Blackwell  Procedure(s) Performed: PHACOEMULSIFICATION, CATARACT, WITH IOL INSERTION 4.61 00:33.4 (Left: Eye)  Patient Location: PACU  Anesthesia Type: MAC  Level of Consciousness: awake, alert  and patient cooperative  Airway and Oxygen Therapy: Patient Spontanous Breathing and Patient connected to supplemental oxygen  Post-op Assessment: Post-op Vital signs reviewed, Patient's Cardiovascular Status Stable, Respiratory Function Stable, Patent Airway and No signs of Nausea or vomiting  Post-op Vital Signs: Reviewed and stable  Complications: No notable events documented.

## 2023-05-29 NOTE — Op Note (Signed)
 PREOPERATIVE DIAGNOSIS:  Nuclear sclerotic cataract of the left eye.   POSTOPERATIVE DIAGNOSIS:  Nuclear sclerotic cataract of the left eye.   OPERATIVE PROCEDURE:ORPROCALL@   SURGEON:  Clair Crews, MD.   ANESTHESIA:  Anesthesiologist: Emilie Harden, MD CRNA: Jahoo, Sonia, CRNA  1.      Managed anesthesia care. 2.     0.29ml of Shugarcaine was instilled following the paracentesis   COMPLICATIONS:  None.   TECHNIQUE:   Stop and chop   DESCRIPTION OF PROCEDURE:  The patient was examined and consented in the preoperative holding area where the aforementioned topical anesthesia was applied to the left eye and then brought back to the Operating Room where the left eye was prepped and draped in the usual sterile ophthalmic fashion and a lid speculum was placed. A paracentesis was created with the side port blade and the anterior chamber was filled with viscoelastic. A near clear corneal incision was performed with the steel keratome. A continuous curvilinear capsulorrhexis was performed with a cystotome followed by the capsulorrhexis forceps. Hydrodissection and hydrodelineation were carried out with BSS on a blunt cannula. The lens was removed in a stop and chop  technique and the remaining cortical material was removed with the irrigation-aspiration handpiece. The capsular bag was inflated with viscoelastic and the Technis ZCB00 lens was placed in the capsular bag without complication. The remaining viscoelastic was removed from the eye with the irrigation-aspiration handpiece. The wounds were hydrated. The anterior chamber was flushed with BSS and the eye was inflated to physiologic pressure. 0.67ml Vigamox was placed in the anterior chamber. The wounds were found to be water tight. The eye was dressed with Combigan . The patient was given protective glasses to wear throughout the day and a shield with which to sleep tonight. The patient was also given drops with which to begin a drop regimen  today and will follow-up with me in one day. Implant Name Type Inv. Item Serial No. Manufacturer Lot No. LRB No. Used Action  LENS IOL TECNIS EYHANCE 23.5 - [id number] Intraocular Lens LENS IOL TECNIS EYHANCE 23.5 [id number] [employer]  Left 1 Implanted    Procedure(s): PHACOEMULSIFICATION, CATARACT, WITH IOL INSERTION 4.61 00:33.4 (Left)  Electronically signed: Clair Crews 05/29/2023 8:27 AM

## 2023-05-29 NOTE — H&P (Signed)
 [facility]   Primary Care Physician:  Adeline Hone, PA-C Ophthalmologist: Dr. Jeb Miner  Pre-Procedure History & Physical: HPI:  Deborah Blackwell is a 63 y.o. female here for cataract surgery.   Past Medical History:  Diagnosis Date   Angina at rest [facility])    Anxiety disorder with panic attacks    Arthritis    Asthma    Back pain with right-sided sciatica 09/30/2014   Bipolar disorder (HCC)    Congenital scoliosis    COPD (chronic obstructive pulmonary disease) (HCC)    DDD (degenerative disc disease), lumbar    Depression    Elevated C-reactive protein (CRP) 11/10/2015   GERD (gastroesophageal reflux disease)    Gout    History of diabetes mellitus    previously on medication, removed once pt lost weight, A1c WNL > 2 yrs   History of kidney stones    History of scoliosis    was casted as a child   Hyperlipidemia    Hypertension    Lumbar radiculopathy 09/30/2014   Migraines    Panic attack    RA (rheumatoid arthritis) (HCC)    Type 2 diabetes, controlled, 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   No meds since weight loss    Past Surgical History:  Procedure Laterality Date   ABDOMINAL HYSTERECTOMY  2003   one ovary remains: due to heavy bleeding/endometrioma/cysts   BACK SURGERY     and injections   CARPAL TUNNEL RELEASE     CATARACT EXTRACTION W/PHACO Right 05/15/2023   Procedure: PHACOEMULSIFICATION, CATARACT, WITH IOL INSERTION 3.97 00:22.3;  Surgeon: Clair Crews, MD;  Location: [facility];  Service: Ophthalmology;  Laterality: Right;   COLONOSCOPY WITH PROPOFOL  N/A 12/09/2019   Procedure: COLONOSCOPY WITH PROPOFOL ;  Surgeon: Marnee Sink, MD;  Location: [facility] END[facility];  Service: Endoscopy;  Laterality: N/A;   FOOT SURGERY Right    bone spur   POLYPECTOMY     uterine    Prior to Admission medications   Medication Sig Start Date End Date Taking? Authorizing Provider  albuterol  (VENTOLIN  HFA) 108 (90 Base) MCG/ACT inhaler INHALE 2 PUFFS INTO THE  LUNGS EVERY 4 HOURS AS NEEDED FOR WHEEZING OR SHORTNESS OF BREATH. 11/13/22  Yes Tapia, Leisa, PA-C  alendronate  (FOSAMAX ) 70 MG tablet TAKE 1 TABLET BY MOUTH EVERY 7 (SEVEN) DAYS. TAKE WITH A FULL GLASS OF WATER ON AN EMPTY STOMACH. 10/16/22  Yes Mecum, Erin E, PA-C  ALPRAZolam  (XANAX ) 1 MG tablet Take 1 tablet by mouth 2 (two) times daily. 09/11/17  Yes [provider]  ascorbic acid (VITAMIN C) 500 MG tablet Take 500 mg by mouth daily.   Yes [provider]  Biotin 1 MG CAPS Take by mouth daily.   Yes [provider]  Calcium  Carb-Cholecalciferol 600-10 MG-MCG TABS Take by mouth.   Yes [provider]  citalopram (CELEXA) 10 MG tablet Take 30 mg by mouth daily. 03/08/21  Yes [provider]  Fluticasone -Umeclidin-Vilant (TRELEGY ELLIPTA ) 200-62.5-25 MCG/ACT AEPB Inhale 1 puff into the lungs daily. Consistent time of day for COPD 10/16/22  Yes Mecum, Erin E, PA-C  levocetirizine (XYZAL ) 5 MG tablet TAKE 1 TABLET BY MOUTH EVERY DAY IN THE EVENING 11/20/22  Yes Tapia, Leisa, PA-C  levothyroxine  (SYNTHROID ) 50 MCG tablet Take 1 tablet (50 mcg total) by mouth as directed. TAKE 1 TABLET (50 MCG) BEFORE BREAKFAST X 5 DAYS A WEEK AND TAKE 1.5 TABLETS (75 MCG) BEFORE BREAKFAST 2X A WEEK 04/18/23  Yes Tapia, Leisa, PA-C  rosuvastatin  (CRESTOR ) 40 MG tablet Take 1 tablet (40 mg total) by mouth daily. For cholesterol 10/16/22  Yes Mecum, Erin E, PA-C  valsartan  (DIOVAN ) 80 MG tablet TAKE 1 TABLET (80 MG TOTAL) BY MOUTH DAILY. FOR BLOOD PRESSURE 10/05/22  Yes Tapia, Leisa, PA-C  vitamin B-12 (CYANOCOBALAMIN ) 1000 MCG tablet Take 1,000 mcg by mouth daily.   Yes [provider]  vitamin D3 (CHOLECALCIFEROL) 25 MCG tablet Take 1,000 Units by mouth daily.   Yes [provider]  vitamin E 180 MG (400 UNITS) capsule Take 400 Units by mouth daily.   Yes [provider]  nystatin  powder Apply topically to rash two to three times a day 02/02/22   Pender,  Julie F, FNP  triamcinolone  ointment (KENALOG ) 0.1 % Apply 1 application topically 2 (two) times daily as needed. 07/29/20   Tapia, Leisa, PA-C    Allergies as of 04/25/2023 - Review Complete 04/18/2023  Allergen Reaction Noted   Contrast media [iodinated contrast media] Shortness Of Breath 07/06/2014   Aspirin Nausea And Vomiting 07/06/2014   Macrobid  [nitrofurantoin  monohyd macro] Hives and Nausea Only 03/01/2015    Family History  Problem Relation Age of Onset   Alzheimer's disease Mother    Dementia Mother    Cancer Mother        colon   Heart disease Mother    Hypertension Mother    Depression Mother    Alzheimer's disease Father    Dementia Father    Hypertension Father    Cancer Brother        lung   Lung disease Brother    Hypertension Sister    Pneumonia Brother    Heart failure Brother    Breast cancer Neg Hx     Social History   Socioeconomic History   Marital status: Widowed    Spouse name: Not on file   Number of children: 0   Years of education: Not on file   Highest education level: Associate degree: academic program  Occupational History   Not on file  Tobacco Use   Smoking status: Former    Current packs/day: 0.00    Average packs/day: 2.0 packs/day for 5.0 years (10.0 ttl pk-yrs)    Types: Cigarettes    Start date: 02/01/1986    Quit date: 02/02/1991    Years since quitting: 32.3    Passive exposure: Past   Smokeless tobacco: Never  Vaping Use   Vaping status: Never Used  Substance and Sexual Activity   Alcohol use: No    Alcohol/week: 0.0 standard drinks of alcohol   Drug use: No   Sexual activity: Not Currently  Other Topics Concern   Not on file  Social History Narrative   Lives alone.      Volunteers and [employer], [employer] food pant[employer] & Hospice   Social Drivers of Health   Financial Resource Strain: Low Risk  (04/12/2023)   Overall Financial Resource Strain (CARDIA)    Difficulty of Paying Living Expenses:  Not hard at all  Food Insecurity: No Food Insecurity (04/12/2023)   Hunger Vital Sign    Worried About Running Out of Food in the Last Year: Never true    Ran Out of Food in the Last Year: Never true  Transportation Needs: No Transportation Needs (04/12/2023)   PRAPARE - Administrator, Civil Service (Medical): No    Lack of Transportation (Non-Medical): No  Physical Activity: Sufficiently Active (04/12/2023)   Exercise Vital Sign  Days of Exercise per Week: 7 days    Minutes of Exercise per Session: 30 min  Stress: No Stress Concern Present (04/12/2023)   [employer] of Occupational Health - Occupational Stress Questionnaire    Feeling of Stress : Only a little  Social Connections: Moderately Integrated (04/12/2023)   Social Connection and Isolation Panel [NHANES]    Frequency of Communication with Friends and Family: More than three times a week    Frequency of Social Gatherings with Friends and Family: Once a week    Attends Religious Services: More than 4 times per year    Active Member of [employer] or Organizations: Yes    Attends Banker Meetings: More than 4 times per year    Marital Status: Widowed  Intimate Partner Violence: Not At Risk (04/12/2023)   Humiliation, Afraid, Rape, and Kick questionnaire    Fear of Current or Ex-Partner: No    Emotionally Abused: No    Physically Abused: No    Sexually Abused: No    Review of Systems: See HPI, otherwise negative ROS  Physical Exam: BP 113/73   Pulse 65   Temp (!) 97.3 F (36.3 C) (Temporal)   Resp 16   Ht 5\' 1"  (1.549 m)   Wt 61.2 kg   SpO2 99%   BMI 25.51 kg/m  General:   Alert, cooperative. Head:  Normocephalic and atraumatic. Respiratory:  Normal work of breathing. Cardiovascular:  NAD  Impression/Plan: Deborah Blackwell is here for cataract surgery.  Risks, benefits, limitations, and alternatives regarding cataract surgery have been reviewed with the patient.  Questions have been  answered.  All parties agreeable.   Clair Crews, MD  05/29/2023, 8:07 AM

## 2023-05-29 NOTE — Anesthesia Postprocedure Evaluation (Signed)
 Anesthesia Post Note  Patient: Deborah Blackwell  Procedure(s) Performed: PHACOEMULSIFICATION, CATARACT, WITH IOL INSERTION 4.61 00:33.4 (Left: Eye)  Patient location during evaluation: PACU Anesthesia Type: MAC Level of consciousness: awake and alert Pain management: pain level controlled Vital Signs Assessment: post-procedure vital signs reviewed and stable Respiratory status: spontaneous breathing, nonlabored ventilation, respiratory function stable and patient connected to nasal cannula oxygen Cardiovascular status: stable and blood pressure returned to baseline Postop Assessment: no apparent nausea or vomiting Anesthetic complications: no   No notable events documented.   Last Vitals:  Vitals:   05/29/23 0827 05/29/23 0832  BP: 116/67 (!) 116/91  Pulse: (!) 58 (!) 59  Resp: 16 15  Temp: (!) 36.4 C (!) 36.4 C  SpO2: 96% 94%    Last Pain:  Vitals:   05/29/23 0832  TempSrc:   PainSc: 0-No pain                 Elihu Grumet Niketa Turner

## 2023-06-08 DIAGNOSIS — F411 Generalized anxiety disorder: Secondary | ICD-10-CM | POA: Diagnosis not present

## 2023-06-08 DIAGNOSIS — Z79899 Other long term (current) drug therapy: Secondary | ICD-10-CM | POA: Diagnosis not present

## 2023-06-08 DIAGNOSIS — F4381 Prolonged grief disorder: Secondary | ICD-10-CM | POA: Diagnosis not present

## 2023-06-08 DIAGNOSIS — F339 Major depressive disorder, recurrent, unspecified: Secondary | ICD-10-CM | POA: Diagnosis not present

## 2023-06-08 DIAGNOSIS — F4310 Post-traumatic stress disorder, unspecified: Secondary | ICD-10-CM | POA: Diagnosis not present

## 2023-06-15 DIAGNOSIS — Z79899 Other long term (current) drug therapy: Secondary | ICD-10-CM | POA: Diagnosis not present

## 2023-06-18 DIAGNOSIS — Z961 Presence of intraocular lens: Secondary | ICD-10-CM | POA: Diagnosis not present

## 2023-07-16 DIAGNOSIS — F339 Major depressive disorder, recurrent, unspecified: Secondary | ICD-10-CM | POA: Diagnosis not present

## 2023-07-16 DIAGNOSIS — F411 Generalized anxiety disorder: Secondary | ICD-10-CM | POA: Diagnosis not present

## 2023-07-16 DIAGNOSIS — F4381 Prolonged grief disorder: Secondary | ICD-10-CM | POA: Diagnosis not present

## 2023-07-16 DIAGNOSIS — Z79899 Other long term (current) drug therapy: Secondary | ICD-10-CM | POA: Diagnosis not present

## 2023-07-16 DIAGNOSIS — F4310 Post-traumatic stress disorder, unspecified: Secondary | ICD-10-CM | POA: Diagnosis not present

## 2023-07-18 DIAGNOSIS — H524 Presbyopia: Secondary | ICD-10-CM | POA: Diagnosis not present

## 2023-07-18 DIAGNOSIS — H52222 Regular astigmatism, left eye: Secondary | ICD-10-CM | POA: Diagnosis not present

## 2023-08-13 DIAGNOSIS — F339 Major depressive disorder, recurrent, unspecified: Secondary | ICD-10-CM | POA: Diagnosis not present

## 2023-08-13 DIAGNOSIS — F4381 Prolonged grief disorder: Secondary | ICD-10-CM | POA: Diagnosis not present

## 2023-08-13 DIAGNOSIS — F411 Generalized anxiety disorder: Secondary | ICD-10-CM | POA: Diagnosis not present

## 2023-08-13 DIAGNOSIS — F4310 Post-traumatic stress disorder, unspecified: Secondary | ICD-10-CM | POA: Diagnosis not present

## 2023-08-15 ENCOUNTER — Ambulatory Visit
Admission: RE | Admit: 2023-08-15 | Discharge: 2023-08-15 | Disposition: A | Discharge status: Home / Self Care | Source: Ambulatory Visit | Attending: Physician Assistant

## 2023-08-15 ENCOUNTER — Ambulatory Visit
Admission: RE | Admit: 2023-08-15 | Discharge: 2023-08-15 | Disposition: A | Discharge status: Home / Self Care | Source: Ambulatory Visit | Attending: Physician Assistant | Admitting: Physician Assistant

## 2023-08-15 DIAGNOSIS — F4381 Prolonged grief disorder: Secondary | ICD-10-CM | POA: Diagnosis not present

## 2023-08-15 DIAGNOSIS — Z0000 Encounter for general adult medical examination without abnormal findings: Secondary | ICD-10-CM

## 2023-08-15 DIAGNOSIS — M816 Localized osteoporosis [Lequesne]: Secondary | ICD-10-CM | POA: Insufficient documentation

## 2023-08-15 DIAGNOSIS — Z1231 Encounter for screening mammogram for malignant neoplasm of breast: Secondary | ICD-10-CM | POA: Insufficient documentation

## 2023-08-15 DIAGNOSIS — F339 Major depressive disorder, recurrent, unspecified: Secondary | ICD-10-CM | POA: Diagnosis not present

## 2023-08-15 DIAGNOSIS — F411 Generalized anxiety disorder: Secondary | ICD-10-CM | POA: Diagnosis not present

## 2023-08-24 ENCOUNTER — Ambulatory Visit: Payer: Self-pay | Admitting: Family Medicine

## 2023-08-25 DIAGNOSIS — B360 Pityriasis versicolor: Secondary | ICD-10-CM | POA: Diagnosis not present

## 2023-09-05 DIAGNOSIS — F4310 Post-traumatic stress disorder, unspecified: Secondary | ICD-10-CM | POA: Diagnosis not present

## 2023-09-05 DIAGNOSIS — F411 Generalized anxiety disorder: Secondary | ICD-10-CM | POA: Diagnosis not present

## 2023-09-05 DIAGNOSIS — F4381 Prolonged grief disorder: Secondary | ICD-10-CM | POA: Diagnosis not present

## 2023-09-18 DIAGNOSIS — F411 Generalized anxiety disorder: Secondary | ICD-10-CM | POA: Diagnosis not present

## 2023-09-18 DIAGNOSIS — F4310 Post-traumatic stress disorder, unspecified: Secondary | ICD-10-CM | POA: Diagnosis not present

## 2023-09-24 ENCOUNTER — Other Ambulatory Visit: Payer: Self-pay | Admitting: Family Medicine

## 2023-09-24 DIAGNOSIS — I10 Essential (primary) hypertension: Secondary | ICD-10-CM

## 2023-09-25 ENCOUNTER — Other Ambulatory Visit: Payer: Self-pay | Admitting: Physician Assistant

## 2023-09-25 DIAGNOSIS — M816 Localized osteoporosis [Lequesne]: Secondary | ICD-10-CM

## 2023-09-25 NOTE — Telephone Encounter (Signed)
 Requested medication (s) are due for refill today:   Yes  Requested medication (s) are on the active medication list:   Yes  Future visit scheduled:   Yes 9/17 with Leisa    Last ordered: 10/05/2022 #90, 3 refills    Unable to refill because labs are due.     Requested Prescriptions  Pending Prescriptions Disp Refills   valsartan  (DIOVAN ) 80 MG tablet [Pharmacy Med Name: VALSARTAN  80 MG TABLET] 90 tablet 3    Sig: TAKE 1 TABLET (80 MG TOTAL) BY MOUTH DAILY. FOR BLOOD PRESSURE     Cardiovascular:  Angiotensin Receptor Blockers Failed - 09/25/2023 10:03 AM      Failed - Cr in normal range and within 180 days    Creat  Date Value Ref Range Status  10/16/2022 0.57 0.50 - 1.05 mg/dL Final   Creatinine, Urine  Date Value Ref Range Status  10/16/2022 111 20 - 275 mg/dL Final         Failed - K in normal range and within 180 days    Potassium  Date Value Ref Range Status  10/16/2022 4.0 3.5 - 5.3 mmol/L Final         Failed - Last BP in normal range    BP Readings from Last 1 Encounters:  05/29/23 (!) 116/91         Passed - Patient is not pregnant      Passed - Valid encounter within last 6 months    Recent Outpatient Visits           5 months ago Type 2 diabetes, controlled, with neuropathy (HCC)   [place] [facility] Leavy Mole, PA-C       Future Appointments             In 1 week Leavy Mole, PA-C [facility], [place]

## 2023-09-26 NOTE — Telephone Encounter (Signed)
 Requested Prescriptions  Pending Prescriptions Disp Refills   alendronate  (FOSAMAX ) 70 MG tablet [Pharmacy Med Name: ALENDRONATE  SODIUM 70 MG TAB] 12 tablet 0    Sig: TAKE 1 TABLET BY MOUTH EVERY 7 (SEVEN) DAYS. TAKE WITH A FULL GLASS OF WATER ON AN EMPTY STOMACH.     Endocrinology:  Bisphosphonates Failed - 09/26/2023  8:27 AM      Failed - Vitamin D  in normal range and within 360 days    25-Hydroxy, Vitamin D -3  Date Value Ref Range Status  10/20/2015 11 ng/mL Final    Comment:    (NOTE) Performed At: [facility] [address] [place], [place] [id number] Pepkowitz Jayson DEL MD Ey:[id number]    25-Hydroxy, Vitamin D -2  Date Value Ref Range Status  10/20/2015 1.4 ng/mL Final   25-Hydroxy, Vitamin D   Date Value Ref Range Status  10/20/2015 12 (L) ng/mL Final    Comment:    (NOTE) Reference Range: All Ages: Target levels 30 - 100    Vit D, 25-Hydroxy  Date Value Ref Range Status  01/31/2021 33 30 - 100 ng/mL Final    Comment:    Vitamin D  Status         25-OH Vitamin D : . Deficiency:                    <20 ng/mL Insufficiency:             20 - 29 ng/mL Optimal:                 > or = 30 ng/mL . For 25-OH Vitamin D  testing on patients on  D2-supplementation and patients for whom quantitation  of D2 and D3 fractions is required, the [facility]ureD(TM) 25-OH VIT D, (D2,D3), LC/MS/MS is recommended: order  code 07111 (patients >14yrs). See Note 1 . Note 1 . For additional information, please refer to  http://education.Quest[facility].com/faq/FAQ199  (This link is being provided for informational/ educational purposes only.)          Failed - Mg Level in normal range and within 360 days    Magnesium  Date Value Ref Range Status  10/20/2015 2.0 1.7 - 2.4 mg/dL Final         Failed - Phosphate in normal range and within 360 days    No results found for: PHOS       Passed - Ca in normal range and within 360 days    Calcium   Date Value Ref  Range Status  10/16/2022 9.2 8.6 - 10.4 mg/dL Final         Passed - Cr in normal range and within 360 days    Creat  Date Value Ref Range Status  10/16/2022 0.57 0.50 - 1.05 mg/dL Final   Creatinine, Urine  Date Value Ref Range Status  10/16/2022 111 20 - 275 mg/dL Final         Passed - eGFR is 30 or above and within 360 days    GFR, Est African American  Date Value Ref Range Status  06/17/2019 85 > OR = 60 mL/min/1.50m2 Final   GFR, Est Non African American  Date Value Ref Range Status  06/17/2019 73 > OR = 60 mL/min/1.68m2 Final   eGFR  Date Value Ref Range Status  10/16/2022 101 > OR = 60 mL/min/1.50m2 Final         Passed - Valid encounter within last 12 months    Recent Outpatient Visits  5 months ago Type 2 diabetes, controlled, with neuropathy [facility])   [place] [facility] Leavy Mole, PA-C       Future Appointments             In 1 week Tapia, Leisa, PA-C [place] [facility], [place]            Passed - Bone Mineral Density or Dexa Scan completed in the last 2 years

## 2023-10-03 ENCOUNTER — Ambulatory Visit (INDEPENDENT_AMBULATORY_CARE_PROVIDER_SITE_OTHER): Admitting: Family Medicine

## 2023-10-03 ENCOUNTER — Encounter: Payer: Self-pay | Admitting: Family Medicine

## 2023-10-03 VITALS — BP 122/70 | HR 64 | Resp 16 | Ht 61.0 in | Wt 137.0 lb

## 2023-10-03 DIAGNOSIS — L299 Pruritus, unspecified: Secondary | ICD-10-CM | POA: Diagnosis not present

## 2023-10-03 DIAGNOSIS — H01001 Unspecified blepharitis right upper eyelid: Secondary | ICD-10-CM

## 2023-10-03 DIAGNOSIS — Z124 Encounter for screening for malignant neoplasm of cervix: Secondary | ICD-10-CM

## 2023-10-03 DIAGNOSIS — E1140 Type 2 diabetes mellitus with diabetic neuropathy, unspecified: Secondary | ICD-10-CM | POA: Diagnosis not present

## 2023-10-03 DIAGNOSIS — R21 Rash and other nonspecific skin eruption: Secondary | ICD-10-CM

## 2023-10-03 DIAGNOSIS — Z5181 Encounter for therapeutic drug level monitoring: Secondary | ICD-10-CM

## 2023-10-03 DIAGNOSIS — Z23 Encounter for immunization: Secondary | ICD-10-CM | POA: Diagnosis not present

## 2023-10-03 DIAGNOSIS — E782 Mixed hyperlipidemia: Secondary | ICD-10-CM | POA: Diagnosis not present

## 2023-10-03 DIAGNOSIS — H01004 Unspecified blepharitis left upper eyelid: Secondary | ICD-10-CM

## 2023-10-03 DIAGNOSIS — E039 Hypothyroidism, unspecified: Secondary | ICD-10-CM

## 2023-10-03 DIAGNOSIS — Z0000 Encounter for general adult medical examination without abnormal findings: Secondary | ICD-10-CM

## 2023-10-03 DIAGNOSIS — I10 Essential (primary) hypertension: Secondary | ICD-10-CM | POA: Diagnosis not present

## 2023-10-03 MED ORDER — KETOCONAZOLE 2 % EX SHAM: 1.0000 | MEDICATED_SHAMPOO | CUTANEOUS | 2 refills | Status: AC

## 2023-10-03 MED ORDER — FLUOCINOLONE ACETONIDE 0.01 % OT OIL
TOPICAL_OIL | OTIC | 1 refills | Status: AC
Start: 2023-10-03 — End: ?

## 2023-10-03 MED ORDER — KETOCONAZOLE 2 % EX CREA
TOPICAL_CREAM | CUTANEOUS | 2 refills | Status: AC
Start: 2023-10-03 — End: ?

## 2023-10-03 MED ORDER — ERYTHROMYCIN 5 MG/GM OP OINT: TOPICAL_OINTMENT | OPHTHALMIC | 0 refills | Status: AC

## 2023-10-03 MED ORDER — DESONIDE 0.05 % EX CREA: TOPICAL_CREAM | CUTANEOUS | 1 refills | Status: AC

## 2023-10-03 MED ORDER — VALSARTAN 80 MG PO TABS: 80.0000 mg | ORAL_TABLET | Freq: Every day | ORAL | 1 refills | Status: AC

## 2023-10-03 NOTE — Assessment & Plan Note (Signed)
 Managed with diet/lifestyle not on medications, last A1c was in prediabetic range Labs today

## 2023-10-03 NOTE — Assessment & Plan Note (Signed)
 On crestor  40, good compliance no SE or concerns Lab Results  Component Value Date   CHOL 203 (H) 10/16/2022   HDL 40 (L) 10/16/2022   LDLCALC 136 (H) 10/16/2022   TRIG 144 10/16/2022   CHOLHDL 5.1 (H) 10/16/2022   Due for labs

## 2023-10-03 NOTE — Assessment & Plan Note (Signed)
 Current Medication Regimen: 50mcg synthroid  Takes medicine daily in am Current Symptoms: denies fatigue, weight changes, heat/cold intolerance, dry skin or CVS symptoms, does have new constipation and rash  Most recent results are below; we will be repeating labs today. Lab Results  Component Value Date   TSH 1.44 10/16/2022

## 2023-10-03 NOTE — Progress Notes (Signed)
 Name: Deborah Blackwell   MRN: [id number]    DOB: 06-12-1960   Date:10/03/2023       Progress Note  Chief Complaint  Patient presents with   Diabetes   Hyperlipidemia   Hypothyroidism   Hypertension   Rash    Subjective:   Deborah Blackwell is a 63 y.o. female, presents to clinic/scheduled today to do CPE, she said all her screening/cancer care was done, she needs refills, routine f/up and has multiple acute complaints today - changed OV to f/up and acute She can reschedule CPE   Rash to scalp neck, behind ears, itching in ears, eyebrows and eyelids all irritated swelling itching  Dry eye eyedrops only from eye dr She got diflucan  from UC and it helped some Eyes, rash and ears still bothering her a lot, new Discussed the use of AI scribe software for clinical note transcription with the patient, who gave verbal consent to proceed.  History of Present Illness Deborah Blackwell is a 63 year old female who presents with a rash and itching around the hairline, ears, and eyes.  Pruritic rash and dermatitis - Rash and itching began approximately two weeks ago, initially located under the hairline and around the ears, extending to the neck - Initial treatment with Diflucan  for ten days led to improvement, especially on the scalp, but persistent symptoms remain around the ears and eyes - No recent changes in personal care products, including cleansers or contact lens solutions - Head & Shoulders dandruff shampoo has improved scalp symptoms but not those involving the ears or eyes - No use of steroids or other treatments for the eyes or ears  Periocular and eyelid irritation - Right eyelid developed redness, burning, itching, and flaking about one week ago, then Swelling and itching of the eyelids, particularly the right upper eyelid - Mild improvement in symptoms today, but ongoing irritation persists - No ocular discharge - Uses Systane eye drops for dry eyes as part of regular  routine  Otologic pruritus - Significant itching deep inside the left ear  Bowel habit changes - Recent onset constipation - Requires stool softener if no bowel movement for two to three days - Constipation is a new symptom  Metabolic and cardiovascular history - Prediabetes diagnosed one year ago; blood glucose levels due for re-evaluation - Currently on medication for hypertension and hyperlipidemia, both reportedly well-controlled  HLD on rosuvastatin  40 daily, no SE or concerns   Lab Results  Component Value Date   HGBA1C 5.7 (H) 10/16/2022   Hypothyroid on levothyroxine  50 mcg daily  Mood anxiety per specialists on celexa and xanax    Current Outpatient Medications:    albuterol  (VENTOLIN  HFA) 108 (90 Base) MCG/ACT inhaler, INHALE 2 PUFFS INTO THE LUNGS EVERY 4 HOURS AS NEEDED FOR WHEEZING OR SHORTNESS OF BREATH., Disp: 18 each, Rfl: 0   alendronate  (FOSAMAX ) 70 MG tablet, TAKE 1 TABLET BY MOUTH EVERY 7 (SEVEN) DAYS. TAKE WITH A FULL GLASS OF WATER ON AN EMPTY STOMACH., Disp: 12 tablet, Rfl: 0   ALPRAZolam  (XANAX ) 1 MG tablet, Take 1 tablet by mouth 2 (two) times daily., Disp: , Rfl: 2   ascorbic acid (VITAMIN C) 500 MG tablet, Take 500 mg by mouth daily., Disp: , Rfl:    Biotin 1 MG CAPS, Take by mouth daily., Disp: , Rfl:    Calcium  Carb-Cholecalciferol 600-10 MG-MCG TABS, Take by mouth., Disp: , Rfl:    citalopram (CELEXA) 10 MG tablet, Take 30 mg by mouth  daily., Disp: , Rfl:    desonide  (DESOWEN ) 0.05 % cream, Apply 2 times daily sparingly to worse affected areas with rash, thickened, itchy, skin intact (behind ears, eyebrows, neck, etc) Therapy should be discontinued when control is achieved., Disp: 30 g, Rfl: 1   erythromycin  ophthalmic ointment, Place 1/2 inch ribbon of ointment it lash line of the affected eye at bedtime x 2 weeks, Disp: 3.5 g, Rfl: 0   Fluocinolone  Acetonide 0.01 % OIL, Otic: 5 drops into the affected ear twice daily for 7 to 14 days., Disp: 20 mL,  Rfl: 1   Fluticasone -Umeclidin-Vilant (TRELEGY ELLIPTA ) 200-62.5-25 MCG/ACT AEPB, Inhale 1 puff into the lungs daily. Consistent time of day for COPD, Disp: 28 each, Rfl: 3   ketoconazole  (NIZORAL ) 2 % cream, Apply to the affected area (neck, behind ears, eyebrows) twice daily for 4 weeks or until clinical response is noted., Disp: 30 g, Rfl: 2   [START ON 10/04/2023] ketoconazole  (NIZORAL ) 2 % shampoo, Apply 1 Application topically 2 (two) times a week. Use on areas with itching rash to scalp, Disp: 120 mL, Rfl: 2   levocetirizine (XYZAL ) 5 MG tablet, TAKE 1 TABLET BY MOUTH EVERY DAY IN THE EVENING, Disp: 90 tablet, Rfl: 0   levothyroxine  (SYNTHROID ) 50 MCG tablet, Take 1 tablet (50 mcg total) by mouth as directed. TAKE 1 TABLET (50 MCG) BEFORE BREAKFAST X 5 DAYS A WEEK AND TAKE 1.5 TABLETS (75 MCG) BEFORE BREAKFAST 2X A WEEK, Disp: 104 tablet, Rfl: 1   nystatin  powder, Apply topically to rash two to three times a day, Disp: 30 g, Rfl: 1   rosuvastatin  (CRESTOR ) 40 MG tablet, Take 1 tablet (40 mg total) by mouth daily. For cholesterol, Disp: 90 tablet, Rfl: 3   triamcinolone  ointment (KENALOG ) 0.1 %, Apply 1 application topically 2 (two) times daily as needed., Disp: 30 g, Rfl: 2   vitamin B-12 (CYANOCOBALAMIN ) 1000 MCG tablet, Take 1,000 mcg by mouth daily., Disp: , Rfl:    vitamin D3 (CHOLECALCIFEROL) 25 MCG tablet, Take 1,000 Units by mouth daily., Disp: , Rfl:    vitamin E 180 MG (400 UNITS) capsule, Take 400 Units by mouth daily., Disp: , Rfl:    valsartan  (DIOVAN ) 80 MG tablet, Take 1 tablet (80 mg total) by mouth daily. 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   Major depression, recurrent (HCC) 01/05/2017   Abnormal  grief reaction 01/05/2017   Chronic pain syndrome 03/07/2016   Vitamin D  deficiency 11/10/2015   Arthralgia 10/20/2015   Lumbar facet hypertrophy (Bilateral) 10/20/2015   Lumbar facet syndrome (Location of Primary Source of Pain) (Right) 10/20/2015   Lumbar spondylosis 10/20/2015   Chronic shoulder pain (Right) 10/18/2015   Chronic low back pain (Location of Primary Source of Pain) (Right) 10/18/2015   Chronic lower extremity pain (Location of Secondary source of pain) (Right) 10/18/2015   Occipital neuralgia (Right) 10/18/2015   Cervicogenic headache 10/18/2015   Type 2 diabetes, controlled, 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   Chronic neck pain (Location of Tertiary source of pain) (Right) 09/30/2014   Cervical disc disorder with radiculopathy of cervical region 09/30/2014   Gout 08/07/2014   Hyperlipidemia 08/07/2014   Hypothyroidism 08/07/2014   Congenital scoliosis 08/07/2014   COPD (chronic  obstructive pulmonary disease) (HCC)    Hypertension     Past Surgical History:  Procedure Laterality Date   ABDOMINAL HYSTERECTOMY  2003   one ovary remains: due to heavy bleeding/endometrioma/cysts   BACK SURGERY     and injections   CARPAL TUNNEL RELEASE     CATARACT EXTRACTION W/PHACO Right 05/15/2023   Procedure: PHACOEMULSIFICATION, CATARACT, WITH IOL INSERTION 3.97 00:22.3;  Surgeon: Jaye Fallow, MD;  Location: [facility];  Service: Ophthalmology;  Laterality: Right;   CATARACT EXTRACTION W/PHACO Left 05/29/2023   Procedure: PHACOEMULSIFICATION, CATARACT, WITH IOL INSERTION 4.61 00:33.4;  Surgeon: Jaye Fallow, MD;  Location: [facility];  Service: Ophthalmology;  Laterality: Left;   COLONOSCOPY WITH PROPOFOL  N/A 12/09/2019   Procedure: COLONOSCOPY WITH PROPOFOL ;  Surgeon: Jinny Carmine, MD;  Location: [facility];  Service: Endoscopy;  Laterality: N/A;   FOOT SURGERY Right    bone spur   POLYPECTOMY     uterine    Family History  Problem Relation  Age of Onset   Alzheimer's disease Mother    Dementia Mother    Cancer Mother        colon   Heart disease Mother    Hypertension Mother    Depression Mother    Alzheimer's disease Father    Dementia Father    Hypertension Father    Cancer Brother        lung   Lung disease Brother    Hypertension Sister    Pneumonia Brother    Heart failure Brother    Breast cancer Neg Hx     Social History   Tobacco Use   Smoking status: Former    Current packs/day: 0.00    Average packs/day: 2.0 packs/day for 5.0 years (10.0 ttl pk-yrs)    Types: Cigarettes    Start date: 02/01/1986    Quit date: 02/02/1991    Years since quitting: 32.6    Passive exposure: Past   Smokeless tobacco: Never  Vaping Use   Vaping status: Never Used  Substance Use Topics   Alcohol use: No    Alcohol/week: 0.0 standard drinks of alcohol   Drug use: No     Allergies  Allergen Reactions   Contrast Media [Iodinated Contrast Media] Shortness Of Breath   Aspirin Nausea And Vomiting   Macrobid  [Nitrofurantoin  Monohyd Macro] Hives and Nausea Only    Health Maintenance  Topic Date Due   Cervical Cancer Screening (HPV/Pap Cotest)  Never done   Pneumococcal Vaccine: 50+ Years (3 of 3 - PCV20 or PCV21) 05/08/2018   HEMOGLOBIN A1C  04/15/2023   Diabetic kidney evaluation - eGFR measurement  10/16/2023   Diabetic kidney evaluation - Urine ACR  10/16/2023   COVID-19 Vaccine (4 - 2025-26 season) 10/18/2023 (Originally 09/17/2023)   Zoster Vaccines- Shingrix (1 of 2) 01/01/2024 (Originally [birth year]/1977)   Medicare Annual Wellness (AWV)  04/11/2024   FOOT EXAM  04/17/2024   OPHTHALMOLOGY EXAM  04/19/2024   Mammogram  08/14/2024   Colonoscopy  12/08/2024   DEXA SCAN  08/14/2025   Influenza Vaccine  Completed   Hepatitis C Screening  Completed   HIV Screening  Completed   Hepatitis B Vaccines 19-59 Average Risk  Aged Out   HPV VACCINES  Aged Out   Meningococcal B Vaccine  Aged Out   DTaP/Tdap/Td  Discontinued     Chart Review Today: I personally reviewed active problem list, medication list, allergies, family history, social history, health maintenance, notes from last encounter, lab results,  imaging with the patient/caregiver today.   Review of Systems  All other systems reviewed and are negative.    Objective:   Vitals:   10/03/23 1322  BP: 122/70  Pulse: 64  Resp: 16  SpO2: 96%  Weight: 137 lb (62.1 kg)  Height: 5' 1 (1.549 m)    Body mass index is 25.89 kg/m.  Physical Exam Vitals and nursing note reviewed.  Constitutional:      General: She is not in acute distress.    Appearance: Normal appearance. She is well-developed and well-groomed. She is not ill-appearing, toxic-appearing or diaphoretic.  HENT:     Head: Normocephalic and atraumatic.     Right Ear: External ear normal.     Left Ear: External ear normal.     Ears:     Comments: Bilateral ear canals R>L with mild edema, dry flaking skin    Nose: Nose normal.  Eyes:     General: No scleral icterus.       Right eye: No discharge.        Left eye: No discharge.     Conjunctiva/sclera: Conjunctivae normal.     Right eye: Right conjunctiva is not injected. No chemosis.    Left eye: Left conjunctiva is not injected. No chemosis.    Comments: Some mild swelling and rash to bilateral upper eyelids R>L  Eyebrows with some flaking peeling skin  (See photo)   Neck:     Trachea: No tracheal deviation.  Cardiovascular:     Rate and Rhythm: Normal rate.     Pulses: Normal pulses.     Heart sounds: Normal heart sounds.  Pulmonary:     Effort: Pulmonary effort is normal. No respiratory distress.     Breath sounds: No stridor.  Skin:    General: Skin is warm and dry.     Findings: Rash present.  Neurological:     Mental Status: She is alert.     Motor: No abnormal muscle tone.     Coordination: Coordination normal.     Gait: Gait normal.  Psychiatric:        Mood and Affect: Mood normal.        Behavior:  Behavior normal. Behavior is cooperative.               Functional Status Survey: Is the patient deaf or have difficulty hearing?: No Does the patient have difficulty seeing, even when wearing glasses/contacts?: No Does the patient have difficulty concentrating, remembering, or making decisions?: No Does the patient have difficulty walking or climbing stairs?: No Does the patient have difficulty dressing or bathing?: No Does the patient have difficulty doing errands alone such as visiting a doctor's office or shopping?: No Results for orders placed or performed in visit on 05/15/23  HM DIABETES EYE EXAM   Collection Time: 04/20/23 12:16 PM  Result Value Ref Range   HM Diabetic Eye Exam No Retinopathy No Retinopathy      Assessment & Plan:   Assessment & Plan  Ear itching Seborrheic dermatitis  vs ectopic dermatitis R>L will call in steroids solution or oil to use, encouraged to take daily 2nd generation antihistamine, avoid q-tips -     Fluocinolone  Acetonide; Otic: 5 drops into the affected ear twice daily for 7 to 14 days.  Dispense: 20 mL; Refill: 1 -     CBC with Differential/Platelet -     TSH -     Ambulatory referral to Dermatology  Blepharitis of upper eyelids  of both eyes, unspecified type  -acrylic nails?  Monitor for other allergens irritants  -warm compresses  -continue dry eye drops per eye dr  -erythromycin  ointment to lash line daily for up to 2 weeks - - Erythromycin ; Place 1/2 inch ribbon of ointment it lash line of the affected eye at bedtime x 2 weeks  Dispense: 3.5 g; Refill: 0 -     Ambulatory referral to Dermatology/f/up with her eye doctor Instructions: Eye symptoms/blepharitis  -Perform good lid hygiene (warm compresses, lid massage, and lid washing)  -Eliminate or limit potential triggers or exacerbating factors (eg, allergens, cigarette smoking, contact lenses)  -For those who use cosmetics, be vigilant about removing makeup at night,  cleaning applicators, and avoiding old or expired products -continue your dry eye lubrication drops - I can try and send in the antibiotic ointment to try on lash line, but really the diagnosis is made my eye doctor with their exam - so I recommend you get an appointment with your eye doctor if not improving in a few days You can use topical antifungals to eyebrow and rash around eye, but do not get in eye If an offending agent can be identified (ie, contact/allergic blepharitis), elimination of the irritant should lead to improvement of signs and symptoms within several days to weeks.  Rash and nonspecific skin eruption Multiple locations to face, scalp, ears, eyelids, eyebrows, started 1 week ago to scalp and neck Seborrheic dermatitis  vs ectopic dermatitis or also may be fungal or contact dermatitis (ears appear chronic not acute) Higher suspicion for seborrheic dermatitis - I explained tx with pt usually with antifungals and +/- topical steroids  -     Ketoconazole ; Apply 1 Application topically 2 (two) times a week. Use on areas with itching rash to scalp  Dispense: 120 mL; Refill: 2 -     Ketoconazole ; Apply to the affected area (neck, behind ears, eyebrows) twice daily for 4 weeks or until clinical response is noted.  Dispense: 30 g; Refill: 2 -     Desonide ; Apply 2 times daily sparingly to worse affected areas with rash, thickened, itchy, skin intact (behind ears, eyebrows, neck, etc) Therapy should be discontinued when control is achieved.  Dispense: 30 g; Refill: 1 -     CBC with Differential/Platelet -     TSH -     Ambulatory referral to Dermatology - see photos Scalp improving - no large patches or plaques, she can cycle her OTC dandriff shampoo and ketoconazole  shampoo, currently does not appear that she needs steroids on scalp -     Ketoconazole ; Apply 1 Application topically 2 (two) times a week. Use on areas with itching rash to scalp  Dispense: 120 mL; Refill: 2  (More  eloquently stated from AI summary:  Seborrheic dermatitis involving scalp, eyelids, and ears with associated blepharitis Recent onset affecting scalp, eyelids, and ears with redness, itching, and flaking. Differential includes dermatitis and psoriatic scalp conditions. Recent dandruff shampoo use improved scalp condition, but symptoms persist in ears and eyelids. Dermatology referral considered. - Prescribe ketoconazole  shampoo for use 2-3 times a week on the scalp, behind the ears, and eyebrows. - Refer to dermatology for further evaluation and management. - Take photographs of affected areas for dermatology consultation. - Consider antifungal and steroid combination treatment for ears and eyelids, pending further research on appropriate medications.)  Type 2 diabetes, controlled, with neuropathy (HCC) Assessment & Plan: Managed with diet/lifestyle not on medications, last A1c was in prediabetic range  Labs today Orders: -     Hemoglobin A1c -     Comprehensive metabolic panel with GFR -     Lipid panel -     Microalbumin / creatinine urine ratio  Hypothyroidism, unspecified type Assessment & Plan: Current Medication Regimen: 50mcg synthroid  Takes medicine daily in am Current Symptoms: denies fatigue, weight changes, heat/cold intolerance, dry skin or CVS symptoms, does have new constipation and rash  Most recent results are below; we will be repeating labs today. Lab Results  Component Value Date   TSH 1.44 10/16/2022  Orders: -     TSH  Mixed hyperlipidemia Assessment & Plan: On crestor  40, good compliance no SE or concerns Lab Results  Component Value Date   CHOL 203 (H) 10/16/2022   HDL 40 (L) 10/16/2022   LDLCALC 136 (H) 10/16/2022   TRIG 144 10/16/2022   CHOLHDL 5.1 (H) 10/16/2022  Due for labs Orders: -     Comprehensive metabolic panel with GFR -     Lipid panel  Primary hypertension Assessment & Plan: Managed on valsartan  80 mg daily BP at goal today, good med  compliance and no SE or concerns Continue meds and diet/lifestyle efforts BP Readings from Last 3 Encounters:  10/03/23 122/70  05/29/23 (!) 116/91  05/15/23 107/65  Orders: -     Valsartan ; Take 1 tablet (80 mg total) by mouth daily. For blood pressure  Dispense: 90 tablet; Refill: 1 -     Comprehensive metabolic panel with GFR  Immunization due -     Flu vaccine HIGH DOSE PF(Fluzone Trivalent)  Encounter for medication monitoring -     Hemoglobin A1c -     Comprehensive metabolic panel with GFR -     CBC with Differential/Platelet -     Lipid panel -     TSH  Constipation Recent onset requiring stool softener use. Possible relation to age or thyroid  dysfunction. - Consider using MiraLAX for gentle relief of constipation.  Recording duration: 24 minutes   Return in about 6 months (around 04/01/2024) for Routine follow-up, reschedule CPE not done today, prn f/up or derm for rash.   Michelene Cower, PA-C 10/03/23 6:09 PM

## 2023-10-03 NOTE — Patient Instructions (Addendum)
 Ear eczema or rash:  Recommend that you be on an antihistamine once daily Ear canal - Fluocinolone  0.01% oil or topical corticosteroid-based ophthalmic solutions (eg, prednisolone 1% ophthalmic solution) can be used for the treatment of atopic dermatitis (eczema type rash) involving the ear canal. If using a cream or ointment, a cotton-tipped stick may facilitate the application.    Eye symptoms/blepharitis   -Perform good lid hygiene (warm compresses, lid massage, and lid washing)  -Eliminate or limit potential triggers or exacerbating factors (eg, allergens, cigarette smoking, contact lenses)  -For those who use cosmetics, be vigilant about removing makeup at night, cleaning applicators, and avoiding old or expired products -continue your dry eye lubrication drops - I can try and send in the antibiotic ointment to try on lash line, but really the diagnosis is made my eye doctor with their exam - so I recommend you get an appointment with your eye doctor if not improving in a few days You can use topical antifungals to eyebrow and rash around eye, but do not get in eye If an offending agent can be identified (ie, contact/allergic blepharitis), elimination of the irritant should lead to improvement of signs and symptoms within several days to weeks.

## 2023-10-03 NOTE — Assessment & Plan Note (Signed)
 Managed on valsartan  80 mg daily BP at goal today, good med compliance and no SE or concerns Continue meds and diet/lifestyle efforts BP Readings from Last 3 Encounters:  10/03/23 122/70  05/29/23 (!) 116/91  05/15/23 107/65

## 2023-10-04 ENCOUNTER — Encounter: Payer: Self-pay | Admitting: Dermatology

## 2023-10-04 ENCOUNTER — Ambulatory Visit: Payer: Self-pay | Admitting: Family Medicine

## 2023-10-04 ENCOUNTER — Ambulatory Visit: Admitting: Dermatology

## 2023-10-04 DIAGNOSIS — L219 Seborrheic dermatitis, unspecified: Secondary | ICD-10-CM

## 2023-10-04 DIAGNOSIS — L209 Atopic dermatitis, unspecified: Secondary | ICD-10-CM

## 2023-10-04 DIAGNOSIS — E039 Hypothyroidism, unspecified: Secondary | ICD-10-CM

## 2023-10-04 DIAGNOSIS — L309 Dermatitis, unspecified: Secondary | ICD-10-CM | POA: Diagnosis not present

## 2023-10-04 LAB — COMPREHENSIVE METABOLIC PANEL WITH GFR
AG Ratio: 1.7 (calc) (ref 1.0–2.5)
ALT: 7 U/L (ref 6–29)
AST: 15 U/L (ref 10–35)
Albumin: 4.2 g/dL (ref 3.6–5.1)
Alkaline phosphatase (APISO): 49 U/L (ref 37–153)
BUN: 18 mg/dL (ref 7–25)
CO2: 32 mmol/L (ref 20–32)
Calcium: 9.7 mg/dL (ref 8.6–10.4)
Chloride: 102 mmol/L (ref 98–110)
Creat: 0.76 mg/dL (ref 0.50–1.05)
Globulin: 2.5 g/dL (ref 1.9–3.7)
Glucose, Bld: 87 mg/dL (ref 65–99)
Potassium: 4.3 mmol/L (ref 3.5–5.3)
Sodium: 140 mmol/L (ref 135–146)
Total Bilirubin: 0.7 mg/dL (ref 0.2–1.2)
Total Protein: 6.7 g/dL (ref 6.1–8.1)
eGFR: 87 mL/min/1.73m2 (ref >=60)

## 2023-10-04 LAB — CBC WITH DIFFERENTIAL/PLATELET
Absolute Lymphocytes: 2070 {cells}/uL (ref 850–3900)
Absolute Monocytes: 490 {cells}/uL (ref 200–950)
Basophils Absolute: 28 {cells}/uL (ref 0–200)
Basophils Relative: 0.4 %
Eosinophils Absolute: 262 {cells}/uL (ref 15–500)
Eosinophils Relative: 3.8 %
HCT: 40.9 % (ref 35.0–45.0)
Hemoglobin: 13 g/dL (ref 11.7–15.5)
MCH: 29 pg (ref 27.0–33.0)
MCHC: 31.8 g/dL — ABNORMAL LOW (ref 32.0–36.0)
MCV: 91.3 fL (ref 80.0–100.0)
MPV: 10.7 fL (ref 7.5–12.5)
Monocytes Relative: 7.1 %
Neutro Abs: 4050 {cells}/uL (ref 1500–7800)
Neutrophils Relative %: 58.7 %
Platelets: 264 Thousand/uL (ref 140–400)
RBC: 4.48 Million/uL (ref 3.80–5.10)
RDW: 11.9 % (ref 11.0–15.0)
Total Lymphocyte: 30 %
WBC: 6.9 Thousand/uL (ref 3.8–10.8)

## 2023-10-04 LAB — HEMOGLOBIN A1C
Hgb A1c MFr Bld: 5.5 % (ref <5.7)
Mean Plasma Glucose: 111 mg/dL
eAG (mmol/L): 6.2 mmol/L

## 2023-10-04 LAB — LIPID PANEL
Cholesterol: 196 mg/dL (ref <200)
HDL: 45 mg/dL — ABNORMAL LOW (ref >=50)
LDL Cholesterol (Calc): 129 mg/dL — ABNORMAL HIGH
Non-HDL Cholesterol (Calc): 151 mg/dL — ABNORMAL HIGH (ref <130)
Total CHOL/HDL Ratio: 4.4 (calc) (ref <5.0)
Triglycerides: 113 mg/dL (ref <150)

## 2023-10-04 LAB — MICROALBUMIN / CREATININE URINE RATIO
Creatinine, Urine: 142 mg/dL (ref 20–275)
Microalb Creat Ratio: 9 mg/g{creat} (ref <30)
Microalb, Ur: 1.3 mg/dL

## 2023-10-04 LAB — TSH: TSH: 1.59 m[IU]/L (ref 0.40–4.50)

## 2023-10-04 MED ORDER — HYDROCORTISONE 2.5 % EX CREA: TOPICAL_CREAM | Freq: Two times a day (BID) | CUTANEOUS | 6 refills | Status: AC | PRN

## 2023-10-04 MED ORDER — LEVOTHYROXINE SODIUM 50 MCG PO TABS: 50.0000 ug | ORAL_TABLET | ORAL | 3 refills | Status: AC

## 2023-10-04 NOTE — Progress Notes (Signed)
 New Patient Visit   Subjective  Deborah Blackwell is a 63 y.o. female who presents for the following: Itching/rashes periocular, ears, scalp, ~2wks, itchy, blurriness in eyes x 3 days pt went to walk in and they gave her Diflucan  x 2 doses, has tried otc fungal cream no new medications, pt was given Ketoconazole  2% cream and Shampoo yesterday by PCP but has not started,  no new products,  check spot L leg 2wks, burning, itching prn  New patient referral from Michelene Cower, PA-C  The following portions of the chart were reviewed this encounter and updated as appropriate: medications, allergies, medical history  Review of Systems:  No other skin or systemic complaints except as noted in HPI or Assessment and Plan.  Objective  Well appearing patient in no apparent distress; mood and affect are within normal limits.   A focused examination was performed of the following areas: Face, ears, scalp  Relevant exam findings are noted in the Assessment and Plan.    Assessment & Plan   SEBORRHEIC DERMATITIS Scalp, ears, face Exam: scaling conchal bowls, post auriculars, nose, eyebrows, scalp  Chronic and persistent condition with duration or expected duration over one year. Condition is bothersome/symptomatic for patient. Currently flared.   Seborrheic Dermatitis is a chronic persistent rash characterized by pinkness and scaling most commonly of the mid face but also can occur on the scalp (dandruff), ears; mid chest, mid back and groin.  It tends to be exacerbated by stress and cooler weather.  People who have neurologic disease may experience new onset or exacerbation of existing seborrheic dermatitis.  The condition is not curable but treatable and can be controlled.  Treatment Plan: Start Ketoconazole  2% cr qd/bid (pt has prescription from PCP) Start HC 2.5% cr bid until improved   Mix hydrocortisone  with ketaconazole 2% twice a day. If improved, decrease to hydrocortisone  and ketaconazole  mixed once a day. If still clear, decrease to ketaconazole only. Start Ketoconazole  2% shampoo 2-3x/wk to wash scalp, let sit 5 minutes and rinse out (pt has prescription from PCP)  Topical steroids (such as triamcinolone , fluocinolone , fluocinonide, mometasone, clobetasol, halobetasol, betamethasone, hydrocortisone ) can cause thinning and lightening of the skin if they are used for too long in the same area. Your physician has selected the right strength medicine for your problem and area affected on the body. Please use your medication only as directed by your physician to prevent side effects.    ECZEMA L leg Exam: Scaly pink scaly plaque L lat distal thigh  Chronic and persistent condition with duration or expected duration over one year. Condition is bothersome/symptomatic for patient. Currently flared.   Atopic dermatitis (eczema) is a chronic, relapsing, pruritic condition that can significantly affect quality of life. It is often associated with allergic rhinitis and/or asthma and can require treatment with topical medications, phototherapy, or in severe cases biologic injectable medication (Dupixent, Adbry, Ebglyss) or oral JAK inhibitors (Rinvoq, Cibinqo).    Treatment Plan: Start HC 2.5% cr qd/bid until clear then prn flares  Topical steroids (such as triamcinolone , fluocinolone , fluocinonide, mometasone, clobetasol, halobetasol, betamethasone, hydrocortisone ) can cause thinning and lightening of the skin if they are used for too long in the same area. Your physician has selected the right strength medicine for your problem and area affected on the body. Please use your medication only as directed by your physician to prevent side effects.   SEBORRHEIC DERMATITIS   ATOPIC DERMATITIS, UNSPECIFIED TYPE    Return if symptoms worsen  or fail to improve.  I, Grayce Saunas, RMA, am acting as scribe for Boneta Sharps, MD .   Documentation: I have reviewed the above  documentation for accuracy and completeness, and I agree with the above.  Boneta Sharps, MD

## 2023-10-04 NOTE — Patient Instructions (Addendum)
 Seborrheic Dermatitis  Mix hydrocortisone  with ketaconazole 2% twice a day. If improved, decrease to hydrocortisone  and ketaconazole mixed once a day. If still clear, decrease to ketaconazole only.Seborrheic Dermatitis  What is seborrheic dermatitis? Seborrheic (say: seb-oh-ree-ick) dermatitis is a disease that causes flaking of the skin.  It usually affects the scalp.  In teenagers and adults, it is commonly called "dandruff".  In infants, it is referred to as "cradle cap".  Dandruff often appears as scaling on the scalp with or without redness.  On other parts of the body, seborrheic dermatitis tends to produce both redness and scaling.  Other common locations of seborrheic dermatitis include the central face, eyebrows, chest, and the creases of the arms, legs, and groin.  It often causes the skin to look a little greasy, scaly, or flaky. Seborrheic dermatitis can occur at any age.  It often comes and goes and may to be seasonally related, especially in the Northern climates.  What causes seborrheic dermatitis? The exact cause is not known, though yeast of the Malassezia species may be involved.  This organism is normally present on the skin in small numbers, but sometimes its numbers increase, especially in oily skin.  Treatments that reduce the yeast tend to improve seborrheic dermatitis.  How is seborrheic dermatitis treated? The treatment of seborrheic dermatitis depends on its location on the body and the person's age. Seborrheic dermatitis of the scalp (dandruff) in adults and teenagers is usually treated with a medicated shampoo.  Here is a list of the medications that help, and the over-counter shampoos that contain them: Salicylic acid (Neutrogena T/Sal, Sebulex, Scalpicin, Denorex Extra Strength) Zinc pyrithione (Head & Shoulders white bottle, Denorex Daily, DHS Zinc, Pantene Pro-V Pyrithione Zinc) Selenium sulfide (Head & Shoulders blue bottle, Selsun Blue, Exsel Lotion Shampoo,  Glo-Sel) Coal tar (Neutrogena T/Gal, Pentrax, Zetar, Tegrin, DHS Tar, Therapeutic Denorex) Ketoconazole  (Nizoral )  If you have dandruff, you might start by using one of these shampoos every day until your dandruff is controlled and then keep using it at least twice a week.  Often times your doctor will recommend a rotation of several different medicated shampoos as some will experience a plateau in the effectiveness of any one shampoo.   When you use a dandruff shampoo, rub the shampoo into your wet hair and massage into scalp thoroughly.  Let it stay on your hair and scalp for 5 minutes before rinsing.  If you have involvement in the eyebrows or face, you can lather those areas with the medicated shampoo as well, or use a medicated soap (ZNP-bar, Polytar Soap, SAStid, or sulfur soap).    If the wash or shampoo alone does not help, your doctor might want you to use a prescription medication once or twice a day.  Leave-in medications for the scalp are best applied by massaging into the scalp immediately after towel drying your hair, but may be applied even if you have not washed your hair.  Seborrheic dermatitis in infants usually clears up by age 16 -29 months.  It may develop in the diaper area where it might be confused with diaper rash.  For milder cases you can try gently brushing out scales with a soft brush.  This is best done immediately after washing with a non-medicated baby shampoo Abe and [employer] [employer], etc.).  Your doctor may recommend a medicated shampoo or a prescription topical medication.     Due to recent changes in healthcare laws, you may see results of your pathology and/or  laboratory studies on MyChart before the doctors have had a chance to review them. We understand that in some cases there may be results that are confusing or concerning to you. Please understand that not all results are received at the same time and often the doctors may need to interpret multiple results  in order to provide you with the best plan of care or course of treatment. Therefore, we ask that you please give us  2 business days to thoroughly review all your results before contacting the office for clarification. Should we see a critical lab result, you will be contacted sooner.   If You Need Anything After Your Visit  If you have any questions or concerns for your doctor, please call our main line at [phone] and press option 4 to reach your doctor's medical assistant. If no one answers, please leave a voicemail as directed and we will return your call as soon as possible. Messages left after 4 pm will be answered the following business day.   You may also send us  a message via MyChart. We typically respond to MyChart messages within 1-2 business days.  For prescription refills, please ask your pharmacy to contact our office. Our fax number is [phone].  If you have an urgent issue when the clinic is closed that cannot wait until the next business day, you can page your doctor at the number below.    Please note that while we do our best to be available for urgent issues outside of office hours, we are not available 24/7.   If you have an urgent issue and are unable to reach us , you may choose to seek medical care at your doctor's office, retail clinic, urgent care center, or emergency room.  If you have a medical emergency, please immediately call 911 or go to the emergency department.  Pager Numbers  - Dr. Hester: [phone]  - Dr. Jackquline: [phone]  - Dr. Claudene: [phone]   - Dr. Raymund: [phone]  In the event of inclement weather, please call our main line at [phone] for an update on the status of any delays or closures.  Dermatology Medication Tips: Please keep the boxes that topical medications come in in order to help keep track of the instructions about where and how to use these. Pharmacies typically print the medication instructions only on  the boxes and not directly on the medication tubes.   If your medication is too expensive, please contact our office at [phone] option 4 or send us  a message through MyChart.   We are unable to tell what your co-pay for medications will be in advance as this is different depending on your insurance coverage. However, we may be able to find a substitute medication at lower cost or fill out paperwork to get insurance to cover a needed medication.   If a prior authorization is required to get your medication covered by your insurance company, please allow us  1-2 business days to complete this process.  Drug prices often vary depending on where the prescription is filled and some pharmacies may offer cheaper prices.  The website www.goodrx.com contains coupons for medications through different pharmacies. The prices here do not account for what the cost may be with help from insurance (it may be cheaper with your insurance), but the website can give you the price if you did not use any insurance.  - You can print the associated coupon and take it with your prescription to the pharmacy.  - You  may also stop by our office during regular business hours and pick up a GoodRx coupon card.  - If you need your prescription sent electronically to a different pharmacy, notify our office through [facility] or by phone at [phone] option 4.     Si Usted Necesita Algo Despus de Su Visita  Tambin puede enviarnos un mensaje a travs de Clinical cytogeneticist. Por lo general respondemos a los mensajes de MyChart en el transcurso de 1 a 2 das hbiles.  Para renovar recetas, por favor pida a su farmacia que se ponga en contacto con nuestra oficina. Randi lakes de fax es [place] [phone].  Si tiene un asunto urgente cuando la clnica est cerrada y que no puede esperar hasta el siguiente da hbil, puede llamar/localizar a su doctor(a) al nmero que aparece a continuacin.   Por favor, tenga en cuenta que  aunque hacemos todo lo posible para estar disponibles para asuntos urgentes fuera del horario de [place], no estamos disponibles las 24 horas del da, los 7 [address]  [address] de la [place].   Si tiene un problema urgente y no puede comunicarse con nosotros, puede optar por buscar atencin mdica  en el consultorio de su doctor(a), en una clnica privada, en un centro de atencin urgente o en una sala de emergencias.  Si tiene Engineer, drilling, por favor llame inmediatamente al 911 o vaya a la sala de emergencias.  Nmeros de bper  - Dr. Hester: [phone]  - Dra. Jackquline: [phone]  - Dr. Claudene: [phone]  - Dra. Kitts: [phone]  En caso de inclemencias del [place], por favor llame a nuestra lnea principal al [phone] para una actualizacin sobre el estado de cualquier retraso o cierre.  Consejos para la medicacin en dermatologa: Por favor, guarde las cajas en las que vienen los medicamentos de uso tpico para ayudarle a seguir las instrucciones sobre dnde y cmo usarlos. Las farmacias generalmente imprimen las instrucciones del medicamento slo en las cajas y no directamente en los tubos del [place].   Si su medicamento es muy caro, por favor, pngase en contacto con landry rieger llamando al [phone] y presione la opcin 4 o envenos un mensaje a travs de Clinical cytogeneticist.   No podemos decirle cul ser su copago por los medicamentos por adelantado ya que esto es diferente dependiendo de la cobertura de su seguro. Sin embargo, es posible que podamos encontrar un medicamento sustituto a Audiological scientist un formulario para que el seguro cubra el medicamento que se considera necesario.   Si se requiere una autorizacin previa para que su compaa de seguros malta su medicamento, por favor permtanos de 1 a 2 das hbiles para completar este proceso.  Los precios de los medicamentos varan con frecuencia dependiendo del Environmental consultant de dnde se surte la receta y alguna farmacias  pueden ofrecer precios ms baratos.  El sitio web www.goodrx.com tiene cupones para medicamentos de Health and safety inspector. Los precios aqu no tienen en cuenta lo que podra costar con la ayuda del seguro (puede ser ms barato con su seguro), pero el sitio web puede darle el precio si no utiliz Tourist information centre manager.  - Puede imprimir el cupn correspondiente y llevarlo con su receta a la farmacia.  - Tambin puede pasar por nuestra oficina durante el horario de atencin regular y Education officer, museum una tarjeta de cupones de GoodRx.  - Si necesita que su receta se enve electrnicamente a Psychiatrist, informe a nuestra oficina a travs de MyChart de [employer] o por telfono  llamando al [phone] y presione la opcin 4.

## 2023-10-07 ENCOUNTER — Other Ambulatory Visit: Payer: Self-pay | Admitting: Physician Assistant

## 2023-10-07 DIAGNOSIS — E782 Mixed hyperlipidemia: Secondary | ICD-10-CM

## 2023-10-08 NOTE — Telephone Encounter (Signed)
 Requested Prescriptions  Pending Prescriptions Disp Refills   rosuvastatin  (CRESTOR ) 40 MG tablet [Pharmacy Med Name: ROSUVASTATIN  CALCIUM  40 MG TAB] 90 tablet 0    Sig: TAKE 1 TABLET BY MOUTH EVERY DAY FOR CHOLESTEROL     Cardiovascular:  Antilipid - Statins 2 Failed - 10/08/2023  4:06 PM      Failed - Lipid Panel in normal range within the last 12 months    Cholesterol, Total  Date Value Ref Range Status  02/26/2015 203 (H) 100 - 199 mg/dL Final   Cholesterol  Date Value Ref Range Status  10/03/2023 196 <200 mg/dL Final   Cholesterol Piccolo, Waived  Date Value Ref Range Status  08/07/2014 196 <200 mg/dL Final    Comment:                            Desirable                <200                         Borderline High      200- 239                         High                     >239    LDL Cholesterol (Calc)  Date Value Ref Range Status  10/03/2023 129 (H) mg/dL (calc) Final    Comment:    Reference range: <100 . Desirable range <100 mg/dL for primary prevention;   <70 mg/dL for patients with CHD or diabetic patients  with > or = 2 CHD risk factors. SABRA LDL-C is now calculated using the Martin-Hopkins  calculation, which is a validated novel method providing  better accuracy than the Friedewald equation in the  estimation of LDL-C.  Gladis APPLETHWAITE et al. SANDREA. 7986;689(80): 2061-2068  (http://education.[facility].com/faq/FAQ164)    HDL  Date Value Ref Range Status  10/03/2023 45 (L) > OR = 50 mg/dL Final  97/89/7982 38 (L) >39 mg/dL Final   Triglycerides  Date Value Ref Range Status  10/03/2023 113 <150 mg/dL Final   Triglycerides Piccolo,Waived  Date Value Ref Range Status  08/07/2014 100 <150 mg/dL Final    Comment:                            Normal                   <150                         Borderline High     150 - 199                         High                200 - 499                         Very High                >499          Passed -  Cr in normal range and within 360 days    Creat  Date  Value Ref Range Status  10/03/2023 0.76 0.50 - 1.05 mg/dL Final   Creatinine, Urine  Date Value Ref Range Status  10/03/2023 142 20 - 275 mg/dL Final         Passed - Patient is not pregnant      Passed - Valid encounter within last 12 months    Recent Outpatient Visits           5 days ago Mixed hyperlipidemia   [facility] Leavy Mole, PA-C   5 months ago Type 2 diabetes, controlled, with neuropathy [facility])   [facility] Leavy Mole, PA-C

## 2023-10-24 DIAGNOSIS — F339 Major depressive disorder, recurrent, unspecified: Secondary | ICD-10-CM | POA: Diagnosis not present

## 2023-10-24 DIAGNOSIS — F411 Generalized anxiety disorder: Secondary | ICD-10-CM | POA: Diagnosis not present

## 2023-11-13 DIAGNOSIS — F339 Major depressive disorder, recurrent, unspecified: Secondary | ICD-10-CM | POA: Diagnosis not present

## 2023-11-13 DIAGNOSIS — F411 Generalized anxiety disorder: Secondary | ICD-10-CM | POA: Diagnosis not present

## 2024-01-09 DIAGNOSIS — F411 Generalized anxiety disorder: Secondary | ICD-10-CM | POA: Diagnosis not present

## 2024-01-09 DIAGNOSIS — F339 Major depressive disorder, recurrent, unspecified: Secondary | ICD-10-CM | POA: Diagnosis not present

## 2024-04-17 ENCOUNTER — Encounter

## 2024-10-03 ENCOUNTER — Encounter: Admitting: Family Medicine
